# Patient Record
Sex: Male | Born: 1975 | Race: White | Hispanic: No | Marital: Single | Smoking: Current every day smoker
Health system: Southern US, Community
[De-identification: ages and names within clinical notes are randomized; demographics above are authoritative.]

## PROBLEM LIST (undated history)

## (undated) DIAGNOSIS — F329 Major depressive disorder, single episode, unspecified: Secondary | ICD-10-CM

## (undated) DIAGNOSIS — F1090 Alcohol use, unspecified, uncomplicated: Secondary | ICD-10-CM

## (undated) DIAGNOSIS — F319 Bipolar disorder, unspecified: Secondary | ICD-10-CM

## (undated) DIAGNOSIS — F32A Depression, unspecified: Secondary | ICD-10-CM

## (undated) DIAGNOSIS — F21 Schizotypal disorder: Secondary | ICD-10-CM

## (undated) DIAGNOSIS — F209 Schizophrenia, unspecified: Secondary | ICD-10-CM

## (undated) HISTORY — DX: Major depressive disorder, single episode, unspecified: F32.9

## (undated) HISTORY — DX: Depression, unspecified: F32.A

---

## 2004-11-18 ENCOUNTER — Emergency Department: Payer: Self-pay | Admitting: Emergency Medicine

## 2008-10-13 ENCOUNTER — Emergency Department: Payer: Self-pay

## 2009-07-18 ENCOUNTER — Emergency Department: Payer: Self-pay | Admitting: Internal Medicine

## 2010-01-25 ENCOUNTER — Emergency Department: Payer: Self-pay | Admitting: Emergency Medicine

## 2010-08-23 ENCOUNTER — Emergency Department (HOSPITAL_COMMUNITY)
Admission: EM | Admit: 2010-08-23 | Discharge: 2010-08-23 | Payer: Self-pay | Source: Home / Self Care | Admitting: Emergency Medicine

## 2010-08-23 ENCOUNTER — Encounter: Payer: Self-pay | Admitting: Emergency Medicine

## 2010-12-06 LAB — DIFFERENTIAL
Basophils Absolute: 0 10*3/uL (ref 0.0–0.1)
Eosinophils Relative: 1 % (ref 0–5)
Lymphocytes Relative: 13 % (ref 12–46)
Lymphs Abs: 1.5 10*3/uL (ref 0.7–4.0)
Monocytes Absolute: 1.1 10*3/uL — ABNORMAL HIGH (ref 0.1–1.0)
Neutro Abs: 8.7 10*3/uL — ABNORMAL HIGH (ref 1.7–7.7)

## 2010-12-06 LAB — CBC
HCT: 38.9 % — ABNORMAL LOW (ref 39.0–52.0)
MCV: 89.8 fL (ref 78.0–100.0)
Platelets: 236 10*3/uL (ref 150–400)
RBC: 4.33 MIL/uL (ref 4.22–5.81)
RDW: 11.7 % (ref 11.5–15.5)
WBC: 11.3 10*3/uL — ABNORMAL HIGH (ref 4.0–10.5)

## 2010-12-06 LAB — POCT CARDIAC MARKERS
CKMB, poc: <1 ng/mL — ABNORMAL LOW (ref 1.0–8.0)
Troponin i, poc: <0.05 ng/mL (ref 0.00–0.09)

## 2010-12-06 LAB — BASIC METABOLIC PANEL
Chloride: 103 mEq/L (ref 96–112)
GFR calc Af Amer: >60 mL/min (ref >60)
GFR calc non Af Amer: >60 mL/min (ref >60)
Potassium: 3.8 mEq/L (ref 3.5–5.1)

## 2010-12-06 LAB — RAPID STREP SCREEN (MED CTR MEBANE ONLY): Streptococcus, Group A Screen (Direct): POSITIVE — AB

## 2010-12-06 LAB — CULTURE, ROUTINE-ABSCESS

## 2011-06-25 ENCOUNTER — Emergency Department: Payer: Self-pay | Admitting: Emergency Medicine

## 2013-03-31 ENCOUNTER — Emergency Department: Payer: Self-pay | Admitting: Emergency Medicine

## 2013-10-24 ENCOUNTER — Emergency Department: Payer: Self-pay | Admitting: Emergency Medicine

## 2015-03-30 ENCOUNTER — Ambulatory Visit: Payer: Self-pay | Admitting: Ophthalmology

## 2017-03-13 ENCOUNTER — Emergency Department
Admission: EM | Admit: 2017-03-13 | Discharge: 2017-03-14 | Payer: Self-pay | Attending: Emergency Medicine | Admitting: Emergency Medicine

## 2017-03-13 ENCOUNTER — Encounter: Payer: Self-pay | Admitting: Emergency Medicine

## 2017-03-13 DIAGNOSIS — S91011A Laceration without foreign body, right ankle, initial encounter: Secondary | ICD-10-CM | POA: Insufficient documentation

## 2017-03-13 DIAGNOSIS — F17210 Nicotine dependence, cigarettes, uncomplicated: Secondary | ICD-10-CM | POA: Insufficient documentation

## 2017-03-13 DIAGNOSIS — F10920 Alcohol use, unspecified with intoxication, uncomplicated: Secondary | ICD-10-CM | POA: Insufficient documentation

## 2017-03-13 DIAGNOSIS — Y929 Unspecified place or not applicable: Secondary | ICD-10-CM | POA: Insufficient documentation

## 2017-03-13 DIAGNOSIS — W01198A Fall on same level from slipping, tripping and stumbling with subsequent striking against other object, initial encounter: Secondary | ICD-10-CM | POA: Insufficient documentation

## 2017-03-13 DIAGNOSIS — Y999 Unspecified external cause status: Secondary | ICD-10-CM | POA: Insufficient documentation

## 2017-03-13 DIAGNOSIS — Y939 Activity, unspecified: Secondary | ICD-10-CM | POA: Insufficient documentation

## 2017-03-13 MED ORDER — LIDOCAINE HCL (PF) 1 % IJ SOLN: 5.0000 mL | Freq: Once | INTRAMUSCULAR | Status: DC

## 2017-03-13 MED ORDER — AMOXICILLIN-POT CLAVULANATE 875-125 MG PO TABS: 1.0000 | ORAL_TABLET | Freq: Two times a day (BID) | ORAL | 0 refills | Status: AC

## 2017-03-13 MED ORDER — LIDOCAINE HCL (PF) 1 % IJ SOLN
INTRAMUSCULAR | Status: AC
  Filled 2017-03-13: qty 10
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
## 2017-03-13 NOTE — ED Notes (Signed)
Wound to rt. Ankle cleaned and redressed.

## 2017-03-13 NOTE — ED Triage Notes (Signed)
Pt. Presents to ED under IVC and in restraints with Sheriffs [employer].  Pt. Presents wearing only t-shirt and blanket worn around waist.  Pt. States he has been drinking today.  Pt. Has a laceration to rt. Ankle.  Bleeding controlled at this time.

## 2017-03-13 NOTE — ED Notes (Signed)
ED Provider at bedside. 

## 2017-03-13 NOTE — ED Notes (Signed)
Pt. Here under IVC, Pt. Presents to ED under the custody of sheriffs dept.  Pt. Unsure how he got laceration to rt. Ankle.  Pt. Has a 2 inch laceration to rt. Ankle.  Swelling noted to ankle.  Bleeding controlled at this time.  Pt. States "I have drinking alcohol today".

## 2017-03-14 NOTE — ED Notes (Signed)
Pt. Discharged into sheriffs custody.

## 2017-03-14 NOTE — ED Notes (Signed)
Redressed rt. Ankle with dressing and bandage.

## 2017-10-24 ENCOUNTER — Ambulatory Visit: Payer: Self-pay | Admitting: Ophthalmology

## 2017-10-31 ENCOUNTER — Ambulatory Visit: Payer: Self-pay | Admitting: Licensed Clinical Social Worker

## 2017-10-31 ENCOUNTER — Ambulatory Visit: Payer: Self-pay | Admitting: Ophthalmology

## 2017-10-31 ENCOUNTER — Encounter: Payer: Self-pay | Admitting: Licensed Clinical Social Worker

## 2017-10-31 DIAGNOSIS — F1020 Alcohol dependence, uncomplicated: Secondary | ICD-10-CM

## 2017-10-31 DIAGNOSIS — F333 Major depressive disorder, recurrent, severe with psychotic symptoms: Secondary | ICD-10-CM

## 2017-10-31 NOTE — Progress Notes (Signed)
Total time:1 hour Type of Service: Integrated Behavioral Health in clinic Interpretor:No.   SUBJECTIVE: Richard Mclean. is a 42 y.o. male  referred by Teah nurse practitioner at [facility] for symptoms of:  depression. Patient is accompanied by himself.  Patient reports the following symptoms and or concerns: bad mood, depression, excessive alcohol consumption, fatigue, irritability, sleep disturbance and tobacco use: stressors: financial concern, illness or family illness and employment concern,Health problems Other: homeless. Duration of problem:  Richard Mclean reports that he has been dealing with symptoms of depression on and off for several years. He notes that his symptoms include sadness, irritability, isolating himself, increase appetite, fatigue, difficulty sleeping, and difficulty concentrating. He denies suicidal and homicidal thoughts.  Impact on function: He notes that he became homeless a year ago when he had an accident at his job requiring surgery on his arm and hand. He notes that he is unable to completely close his hand and has been unable to find a job.  Current or Hx of substance use: Previously a member of [employer]. He admits to drinking on occasion, previously used to drink heavily; malt liquor and liquor like every weekend or 3 or 4 40 oz ice houses a few times a week, and last use was a week ago.  PSYCHIATRIC HISTORY - Medical conditions that might explain or contribute to symptoms: Surgeries to arm and hand due to an accident at his previous part time job a year in half ago. He is unable to fully open and close his hand.  Broke his right foot and heal bone several years ago. He notes that these are barriers to finding employment. Artery replacement surgery. - Hospitalizations/ Outpatient therapy:  He reports that he was hospitalized a few times for depression and last one was ten to fifteen years ago. He repots that he was previously hospitalized at  [facility], [facility], and [facility].  -Pharmacotherapy:He reports that he is was previously on mental health medications but is unable to remember the name or dosages. -Family history of psychiatric issues: He reports that his father attempted suicide by shooting himself but lived in patient's words. He reports that his mom has a history of depression, is on medication, and is an alcohol. He reports that his brother is gay and is aids.   OBJECTIVE:  Appearance:Casual; Mood: Euthymic ; Thought process: Coherent; Affect: Appropriate  Risk of harm to self or others: No plan to harm self or others   LIFE CONTEXT:  Family & Social:,patient lives with [employer] Work: Patient received his  GED while incarcerated in prison.  Life changes: Barriers are homelessness, transportation, and medical problems.  GOALS ADDRESSED:  Patient will reduce symptoms of: depression and stress; increase ability ZO:[id number] skills, will also :Increase healthy adjustment to current life circumstances.  INTERVENTIONS:Screening Tool(s)  Administered, Link to [employer], Copywriter, advertising , Reflective listening Standardized Assessments completed: PHQ 9=20,indication of: severe depression. Patient declined screening=0 indication of: na anxiety.   ISSUES DISCUSSED: Integrated care services, support system, previous and current coping skills, community resources , community support, things patient enjoy or use to enjoy doing, excessive drinking and .     ASSESSMENT:  Patient currently experiencing symptoms of  depression.  Symptoms exacerbated by health problems.  Patient may benefit from, and is in agreement to receive further assessment and brief therapeutic interventions to assist with managing symptoms.   PLAN: . Patient will F/U with LCSW n/a . LCSW  will F/U with phone call n/a . Behavioral recommendations: Discussion with patient about following up for one on one  therapy and case management services with Carey BullocksHeather Barak Bialecki LCSW in clinic and patient declined. . Referral:Psychiatrist, and Vocational Rehabilitation . From scale of 1-10, how likely are you to follow plan: 7  Warm Hand Off Completed.

## 2017-12-01 ENCOUNTER — Emergency Department
Admission: EM | Admit: 2017-12-01 | Discharge: 2017-12-02 | Disposition: A | Discharge status: Home / Self Care | Payer: Self-pay | Attending: Emergency Medicine | Admitting: Emergency Medicine

## 2017-12-01 DIAGNOSIS — F17210 Nicotine dependence, cigarettes, uncomplicated: Secondary | ICD-10-CM | POA: Insufficient documentation

## 2017-12-01 DIAGNOSIS — R0781 Pleurodynia: Secondary | ICD-10-CM | POA: Insufficient documentation

## 2017-12-01 DIAGNOSIS — F10920 Alcohol use, unspecified with intoxication, uncomplicated: Secondary | ICD-10-CM | POA: Insufficient documentation

## 2017-12-02 ENCOUNTER — Emergency Department: Payer: Self-pay

## 2017-12-02 LAB — COMPREHENSIVE METABOLIC PANEL
ALT: 29 U/L (ref 17–63)
AST: 28 U/L (ref 15–41)
Albumin: 4.8 g/dL (ref 3.5–5.0)
Alkaline Phosphatase: 61 U/L (ref 38–126)
Anion gap: 13 (ref 5–15)
BILIRUBIN TOTAL: 0.6 mg/dL (ref 0.3–1.2)
BUN: 13 mg/dL (ref 6–20)
CO2: 21 mmol/L — ABNORMAL LOW (ref 22–32)
CREATININE: 0.88 mg/dL (ref 0.61–1.24)
Calcium: 9.1 mg/dL (ref 8.9–10.3)
Chloride: 108 mmol/L (ref 101–111)
GFR calc Af Amer: >60 mL/min (ref >60)
Glucose, Bld: 106 mg/dL — ABNORMAL HIGH (ref 65–99)
Potassium: 3.9 mmol/L (ref 3.5–5.1)
Sodium: 142 mmol/L (ref 135–145)
TOTAL PROTEIN: 8.4 g/dL — AB (ref 6.5–8.1)

## 2017-12-02 LAB — URINE DRUG SCREEN, QUALITATIVE (ARMC ONLY)
Amphetamines, Ur Screen: NOT DETECTED
BENZODIAZEPINE, UR SCRN: NOT DETECTED
Barbiturates, Ur Screen: NOT DETECTED
Cannabinoid 50 Ng, Ur ~~LOC~~: POSITIVE — AB
Cocaine Metabolite,Ur ~~LOC~~: NOT DETECTED
MDMA (Ecstasy)Ur Screen: NOT DETECTED
METHADONE SCREEN, URINE: NOT DETECTED
OPIATE, UR SCREEN: NOT DETECTED
Phencyclidine (PCP) Ur S: NOT DETECTED
TRICYCLIC, UR SCREEN: NOT DETECTED

## 2017-12-02 LAB — CBC
HCT: 44.5 % (ref 40.0–52.0)
Hemoglobin: 15.6 g/dL (ref 13.0–18.0)
MCH: 32.7 pg (ref 26.0–34.0)
MCHC: 35 g/dL (ref 32.0–36.0)
MCV: 93.4 fL (ref 80.0–100.0)
Platelets: 259 10*3/uL (ref 150–440)
RBC: 4.76 MIL/uL (ref 4.40–5.90)
RDW: 12.2 % (ref 11.5–14.5)
WBC: 9.3 10*3/uL (ref 3.8–10.6)

## 2017-12-02 LAB — ETHANOL: ALCOHOL ETHYL (B): 282 mg/dL — AB (ref <10)

## 2017-12-02 NOTE — ED Notes (Signed)
Pt discharged home after verbalizing understanding of discharge instructions; nad noted. 

## 2017-12-02 NOTE — ED Notes (Signed)
Pt awake. Assisted up to the bathroom by EDT x 2 and changed into dry scrubs. Urine specimen obtained.

## 2017-12-02 NOTE — ED Notes (Signed)
Patient transported to X-ray 
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
## 2017-12-02 NOTE — ED Notes (Signed)
Pt returned from xray
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
## 2018-01-29 ENCOUNTER — Emergency Department (HOSPITAL_COMMUNITY)
Admission: EM | Admit: 2018-01-29 | Discharge: 2018-01-30 | Disposition: A | Discharge status: Home / Self Care | Payer: Self-pay | Attending: Emergency Medicine | Admitting: Emergency Medicine

## 2018-01-29 ENCOUNTER — Emergency Department (HOSPITAL_COMMUNITY): Payer: Self-pay

## 2018-01-29 ENCOUNTER — Encounter (HOSPITAL_COMMUNITY): Payer: Self-pay | Admitting: Emergency Medicine

## 2018-01-29 DIAGNOSIS — F10920 Alcohol use, unspecified with intoxication, uncomplicated: Secondary | ICD-10-CM

## 2018-01-29 DIAGNOSIS — S025XXA Fracture of tooth (traumatic), initial encounter for closed fracture: Secondary | ICD-10-CM | POA: Insufficient documentation

## 2018-01-29 DIAGNOSIS — Y929 Unspecified place or not applicable: Secondary | ICD-10-CM | POA: Insufficient documentation

## 2018-01-29 DIAGNOSIS — S0083XA Contusion of other part of head, initial encounter: Secondary | ICD-10-CM | POA: Insufficient documentation

## 2018-01-29 DIAGNOSIS — F10129 Alcohol abuse with intoxication, unspecified: Secondary | ICD-10-CM | POA: Insufficient documentation

## 2018-01-29 DIAGNOSIS — Y939 Activity, unspecified: Secondary | ICD-10-CM | POA: Insufficient documentation

## 2018-01-29 DIAGNOSIS — W010XXA Fall on same level from slipping, tripping and stumbling without subsequent striking against object, initial encounter: Secondary | ICD-10-CM | POA: Insufficient documentation

## 2018-01-29 DIAGNOSIS — S0993XA Unspecified injury of face, initial encounter: Secondary | ICD-10-CM

## 2018-01-29 DIAGNOSIS — F17210 Nicotine dependence, cigarettes, uncomplicated: Secondary | ICD-10-CM | POA: Insufficient documentation

## 2018-01-29 DIAGNOSIS — Y999 Unspecified external cause status: Secondary | ICD-10-CM | POA: Insufficient documentation
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
## 2018-01-29 NOTE — ED Notes (Signed)
Patient sitting in wheel chair in triage reports "I am good at acting up." Patient begins flailing arms and legs, attempting to flip wheelchair backwards. States "I sometimes have grand mal seizures."  A&Ox4.
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
## 2018-01-30 MED ORDER — ACETAMINOPHEN 325 MG PO TABS
650.0000 mg | ORAL_TABLET | Freq: Once | ORAL | Status: AC
  Administered 2018-01-30: 650 mg via ORAL
  Filled 2018-01-30: qty 2

## 2018-01-30 NOTE — ED Provider Notes (Signed)
[facility]-EMERGENCY DEPT Provider Note   CSN: [id number] Arrival date & time: 01/29/18  2019     History   Chief Complaint Chief Complaint  Patient presents with  . Fall  . Alcohol Intoxication    HPI Yukio Bisping Brett Darko. is a 42 y.o. male.  HPI Patient is a 42 year old male who admits to drinking alcohol this evening and reports falling forward and striking his face on the ground.  Brought to the emergency department via EMS.  C-collar in place.  Intoxicated.  Denies weakness of the arms and legs.  No paresthesias in his arms.  Denies chest and abdominal pain.  Denies weakness of his arms or legs.  No back pain.  Mild headache at this time.  No recent illness.  Presents with swelling of his lower lip and traumatic loss of tooth #24 and 25.   Past Medical History:  Diagnosis Date  . Depression     There are no active problems to display for this patient.   History reviewed. No pertinent surgical history.      Home Medications    Prior to Admission medications   Not on File    Family History Family History  Problem Relation Age of Onset  . Alcohol abuse Mother   . Depression Mother   . Mental illness Mother   . Mental illness Father   . Mental illness Sister   . HIV/AIDS Brother   . Mental illness Maternal Grandmother     Social History Social History   Tobacco Use  . Smoking status: Current Every Day Smoker    Types: Cigarettes  . Smokeless tobacco: Former Neurosurgeon    Types: Snuff  Substance Use Topics  . Alcohol use: Yes    Alcohol/week: 2.4 oz    Types: 4 Cans of beer per week    Comment: Malt liquor or liquor every other weekend  . Drug use: Not on file     Allergies   Patient has no known allergies.   Review of Systems Review of Systems  All other systems reviewed and are negative.    Physical Exam Updated Vital Signs BP (!) 133/94 (BP Location: Right Arm)   Pulse (!) 116   Temp 99.5 F (37.5 C) (Oral)   Resp  20   SpO2 96%   Physical Exam  Constitutional: He is oriented to person, place, and time. He appears well-developed and well-nourished.  HENT:  Head: Normocephalic.  Swelling of the lower lip.  Traumatic loss of tooth #24 and 25 without bleeding from the gumline.  No trismus or malocclusion.  Tolerating secretions.  Frontal and maxillary sinuses without tenderness  Eyes: EOM are normal.  Neck: Normal range of motion.  Pulmonary/Chest: Effort normal.  Abdominal: He exhibits no distension.  Musculoskeletal: Normal range of motion.  Neurological: He is alert and oriented to person, place, and time.  Psychiatric: He has a normal mood and affect.  Nursing note and vitals reviewed.    ED Treatments / Results  Labs (all labs ordered are listed, but only abnormal results are displayed) Labs Reviewed - No data to display  EKG None  Radiology Ct Head Wo Contrast  Result Date: 01/30/2018 CLINICAL DATA:  42 year old male with facial trauma. EXAM: CT HEAD WITHOUT CONTRAST CT MAXILLOFACIAL WITHOUT CONTRAST CT CERVICAL SPINE WITHOUT CONTRAST TECHNIQUE: Multidetector CT imaging of the head, cervical spine, and maxillofacial structures were performed using the standard protocol without intravenous contrast. Multiplanar CT image reconstructions of the  cervical spine and maxillofacial structures were also generated. COMPARISON:  Head CT dated 01/25/2010 FINDINGS: CT HEAD FINDINGS Brain: No evidence of acute infarction, hemorrhage, hydrocephalus, extra-axial collection or mass lesion/mass effect. Vascular: No hyperdense vessel or unexpected calcification. Skull: Normal. Negative for fracture or focal lesion. Other: None CT MAXILLOFACIAL FINDINGS Osseous: No fracture or mandibular dislocation. No destructive process. There are multiple dental caries with periapical lucency involving the maxillary central and lateral incisor teeth. There is absence of left mandibular incisor teeth. Clinical correlation is  recommended. Orbits: Negative. No traumatic or inflammatory finding. Sinuses: Clear. Soft tissues: Negative. CT CERVICAL SPINE FINDINGS Alignment: No acute subluxation. Skull base and vertebrae: No acute fracture. No primary bone lesion or focal pathologic process. Soft tissues and spinal canal: No prevertebral fluid or swelling. No visible canal hematoma. Disc levels: No acute findings. Mild degenerative changes at C6-C7. Upper chest: Negative. Other: None IMPRESSION: 1. Normal noncontrast CT of the brain. 2. No acute/traumatic cervical spine pathology. 3. No acute facial bone fractures. 4. Multiple dental caries and periapical lucency involving the maxillary incisor teeth. Correlation with dental exam recommended. Electronically Signed   By: Elgie Collard M.D.   On: 01/30/2018 01:30   Ct Cervical Spine Wo Contrast  Result Date: 01/30/2018 CLINICAL DATA:  42 year old male with facial trauma. EXAM: CT HEAD WITHOUT CONTRAST CT MAXILLOFACIAL WITHOUT CONTRAST CT CERVICAL SPINE WITHOUT CONTRAST TECHNIQUE: Multidetector CT imaging of the head, cervical spine, and maxillofacial structures were performed using the standard protocol without intravenous contrast. Multiplanar CT image reconstructions of the cervical spine and maxillofacial structures were also generated. COMPARISON:  Head CT dated 01/25/2010 FINDINGS: CT HEAD FINDINGS Brain: No evidence of acute infarction, hemorrhage, hydrocephalus, extra-axial collection or mass lesion/mass effect. Vascular: No hyperdense vessel or unexpected calcification. Skull: Normal. Negative for fracture or focal lesion. Other: None CT MAXILLOFACIAL FINDINGS Osseous: No fracture or mandibular dislocation. No destructive process. There are multiple dental caries with periapical lucency involving the maxillary central and lateral incisor teeth. There is absence of left mandibular incisor teeth. Clinical correlation is recommended. Orbits: Negative. No traumatic or inflammatory  finding. Sinuses: Clear. Soft tissues: Negative. CT CERVICAL SPINE FINDINGS Alignment: No acute subluxation. Skull base and vertebrae: No acute fracture. No primary bone lesion or focal pathologic process. Soft tissues and spinal canal: No prevertebral fluid or swelling. No visible canal hematoma. Disc levels: No acute findings. Mild degenerative changes at C6-C7. Upper chest: Negative. Other: None IMPRESSION: 1. Normal noncontrast CT of the brain. 2. No acute/traumatic cervical spine pathology. 3. No acute facial bone fractures. 4. Multiple dental caries and periapical lucency involving the maxillary incisor teeth. Correlation with dental exam recommended. Electronically Signed   By: Elgie Collard M.D.   On: 01/30/2018 01:30   Ct Maxillofacial Wo Contrast  Result Date: 01/30/2018 CLINICAL DATA:  42 year old male with facial trauma. EXAM: CT HEAD WITHOUT CONTRAST CT MAXILLOFACIAL WITHOUT CONTRAST CT CERVICAL SPINE WITHOUT CONTRAST TECHNIQUE: Multidetector CT imaging of the head, cervical spine, and maxillofacial structures were performed using the standard protocol without intravenous contrast. Multiplanar CT image reconstructions of the cervical spine and maxillofacial structures were also generated. COMPARISON:  Head CT dated 01/25/2010 FINDINGS: CT HEAD FINDINGS Brain: No evidence of acute infarction, hemorrhage, hydrocephalus, extra-axial collection or mass lesion/mass effect. Vascular: No hyperdense vessel or unexpected calcification. Skull: Normal. Negative for fracture or focal lesion. Other: None CT MAXILLOFACIAL FINDINGS Osseous: No fracture or mandibular dislocation. No destructive process. There are multiple dental caries with periapical lucency involving the  maxillary central and lateral incisor teeth. There is absence of left mandibular incisor teeth. Clinical correlation is recommended. Orbits: Negative. No traumatic or inflammatory finding. Sinuses: Clear. Soft tissues: Negative. CT CERVICAL  SPINE FINDINGS Alignment: No acute subluxation. Skull base and vertebrae: No acute fracture. No primary bone lesion or focal pathologic process. Soft tissues and spinal canal: No prevertebral fluid or swelling. No visible canal hematoma. Disc levels: No acute findings. Mild degenerative changes at C6-C7. Upper chest: Negative. Other: None IMPRESSION: 1. Normal noncontrast CT of the brain. 2. No acute/traumatic cervical spine pathology. 3. No acute facial bone fractures. 4. Multiple dental caries and periapical lucency involving the maxillary incisor teeth. Correlation with dental exam recommended. Electronically Signed   By: Elgie Collard M.D.   On: 01/30/2018 01:30    Procedures Procedures (including critical care time)  Medications Ordered in ED Medications - No data to display   Initial Impression / Assessment and Plan / ED Course  I have reviewed the triage vital signs and the nursing notes.  Pertinent labs & imaging results that were available during my care of the patient were reviewed by me and considered in my medical decision making (see chart for details).     Alcohol intoxication.  CT images demonstrate no acute traumatic radiographic pathology.  He does have traumatic loss of tooth #24 and 25 for which he will need oral surgery follow-up.  Recommended soft diet as tolerated.  Riemer care follow-up.  Patient understands return to the emergency department for new or worsening symptoms  Final Clinical Impressions(s) / ED Diagnoses   Final diagnoses:  Contusion of face, initial encounter  Dental injury, initial encounter  Acute alcoholic intoxication without complication [facility])    ED Discharge Orders    None       Azalia Bilis, MD 01/30/18 Earle Gell

## 2018-01-30 NOTE — ED Notes (Signed)
Pt is in CT

## 2018-07-16 ENCOUNTER — Emergency Department (HOSPITAL_COMMUNITY)
Admission: EM | Admit: 2018-07-16 | Discharge: 2018-07-17 | Disposition: A | Discharge status: Home / Self Care | Payer: Self-pay | Attending: Emergency Medicine | Admitting: Emergency Medicine

## 2018-07-16 ENCOUNTER — Encounter (HOSPITAL_COMMUNITY): Payer: Self-pay | Admitting: Emergency Medicine

## 2018-07-16 DIAGNOSIS — F17210 Nicotine dependence, cigarettes, uncomplicated: Secondary | ICD-10-CM | POA: Insufficient documentation

## 2018-07-16 DIAGNOSIS — F10929 Alcohol use, unspecified with intoxication, unspecified: Secondary | ICD-10-CM | POA: Insufficient documentation

## 2018-07-16 DIAGNOSIS — F10920 Alcohol use, unspecified with intoxication, uncomplicated: Secondary | ICD-10-CM

## 2018-07-16 LAB — CBC WITH DIFFERENTIAL/PLATELET
ABS IMMATURE GRANULOCYTES: 0.02 10*3/uL (ref 0.00–0.07)
BASOS ABS: 0.1 10*3/uL (ref 0.0–0.1)
BASOS PCT: 1 %
EOS ABS: 0.2 10*3/uL (ref 0.0–0.5)
Eosinophils Relative: 2 %
HEMATOCRIT: 45.6 % (ref 39.0–52.0)
Hemoglobin: 15.5 g/dL (ref 13.0–17.0)
Immature Granulocytes: 0 %
LYMPHS ABS: 3 10*3/uL (ref 0.7–4.0)
Lymphocytes Relative: 35 %
MCH: 32.8 pg (ref 26.0–34.0)
MCHC: 34 g/dL (ref 30.0–36.0)
MCV: 96.6 fL (ref 80.0–100.0)
MONOS PCT: 7 %
Monocytes Absolute: 0.6 10*3/uL (ref 0.1–1.0)
NEUTROS ABS: 4.8 10*3/uL (ref 1.7–7.7)
NEUTROS PCT: 55 %
NRBC: 0 % (ref 0.0–0.2)
PLATELETS: 254 10*3/uL (ref 150–400)
RBC: 4.72 MIL/uL (ref 4.22–5.81)
RDW: 11.8 % (ref 11.5–15.5)
WBC: 8.6 10*3/uL (ref 4.0–10.5)

## 2018-07-16 NOTE — ED Triage Notes (Signed)
Patient arrived with EMS from street intoxicated with ETOH , ambulatory , denies suicidal ideation or hallucinations .

## 2018-07-17 LAB — RAPID URINE DRUG SCREEN, HOSP PERFORMED
Amphetamines: NOT DETECTED
BARBITURATES: NOT DETECTED
BENZODIAZEPINES: NOT DETECTED
Cocaine: NOT DETECTED
Opiates: NOT DETECTED
TETRAHYDROCANNABINOL: NOT DETECTED

## 2018-07-17 LAB — COMPREHENSIVE METABOLIC PANEL
ALBUMIN: 3.7 g/dL (ref 3.5–5.0)
ALK PHOS: 48 U/L (ref 38–126)
ALT: 38 U/L (ref 0–44)
AST: 36 U/L (ref 15–41)
Anion gap: 10 (ref 5–15)
BILIRUBIN TOTAL: 0.5 mg/dL (ref 0.3–1.2)
BUN: 8 mg/dL (ref 6–20)
CO2: 18 mmol/L — AB (ref 22–32)
Calcium: 8.5 mg/dL — ABNORMAL LOW (ref 8.9–10.3)
Chloride: 114 mmol/L — ABNORMAL HIGH (ref 98–111)
Creatinine, Ser: 1.01 mg/dL (ref 0.61–1.24)
GFR calc Af Amer: >60 mL/min (ref >60)
GFR calc non Af Amer: >60 mL/min (ref >60)
GLUCOSE: 101 mg/dL — AB (ref 70–99)
Potassium: 3.4 mmol/L — ABNORMAL LOW (ref 3.5–5.1)
SODIUM: 142 mmol/L (ref 135–145)
TOTAL PROTEIN: 6 g/dL — AB (ref 6.5–8.1)

## 2018-07-17 LAB — ETHANOL: Alcohol, Ethyl (B): 301 mg/dL (ref <10)

## 2018-07-17 NOTE — ED Provider Notes (Signed)
[facility] EMERGENCY DEPARTMENT Provider Note   CSN: [id number] Arrival date & time: 07/16/18  2307     History   Chief Complaint Chief Complaint  Patient presents with  . Alcohol Intoxication    HPI Richard Reifsteck Kiptyn Rafuse. is a 42 y.o. male.  Patient presents the emergency department with a chief complaint of alcohol intoxication.  He was brought to the emergency department by EMS, after being found intoxicated on the street.  Patient denies any complaints at this time.  He states that he has been drinking today, and that he was drinking a little more heavily than normal.  He states that he just needs something to eat and somewhere to sleep.  The history is provided by the patient. No language interpreter was used.    Past Medical History:  Diagnosis Date  . Depression     There are no active problems to display for this patient.   History reviewed. No pertinent surgical history.      Home Medications    Prior to Admission medications   Not on File    Family History Family History  Problem Relation Age of Onset  . Alcohol abuse Mother   . Depression Mother   . Mental illness Mother   . Mental illness Father   . Mental illness Sister   . HIV/AIDS Brother   . Mental illness Maternal Grandmother     Social History Social History   Tobacco Use  . Smoking status: Current Every Day Smoker    Types: Cigarettes  . Smokeless tobacco: Former Neurosurgeon    Types: Snuff  Substance Use Topics  . Alcohol use: Yes    Alcohol/week: 4.0 standard drinks    Types: 4 Cans of beer per week    Comment: Malt liquor or liquor every other weekend  . Drug use: Never     Allergies   Patient has no known allergies.   Review of Systems Review of Systems  All other systems reviewed and are negative.    Physical Exam Updated Vital Signs BP 126/88 (BP Location: Right Arm)   Pulse 82   Temp 98.4 F (36.9 C) (Oral)   Resp 16   SpO2 100%   Physical  Exam  Constitutional: He is oriented to person, place, and time. No distress.  Eating and drinking  HENT:  Head: Normocephalic and atraumatic.  Eyes: Pupils are equal, round, and reactive to light. Conjunctivae and EOM are normal.  Neck: No tracheal deviation present.  Cardiovascular: Normal rate.  Pulmonary/Chest: Effort normal. No respiratory distress.  Abdominal: Soft.  Musculoskeletal: Normal range of motion.  Neurological: He is alert and oriented to person, place, and time.  Skin: Skin is warm and dry. He is not diaphoretic.  Psychiatric: Judgment normal.  Nursing note and vitals reviewed.    ED Treatments / Results  Labs (all labs ordered are listed, but only abnormal results are displayed) Labs Reviewed  COMPREHENSIVE METABOLIC PANEL - Abnormal; Notable for the following components:      Result Value   Potassium 3.4 (*)    Chloride 114 (*)    CO2 18 (*)    Glucose, Bld 101 (*)    Calcium 8.5 (*)    Total Protein 6.0 (*)    All other components within normal limits  ETHANOL - Abnormal; Notable for the following components:   Alcohol, Ethyl (B) 301 (*)    All other components within normal limits  CBC WITH DIFFERENTIAL/PLATELET  RAPID URINE DRUG SCREEN, HOSP PERFORMED    EKG None  Radiology No results found.  Procedures Procedures (including critical care time)  Medications Ordered in ED Medications - No data to display   Initial Impression / Assessment and Plan / ED Course  I have reviewed the triage vital signs and the nursing notes.  Pertinent labs & imaging results that were available during my care of the patient were reviewed by me and considered in my medical decision making (see chart for details).     Patient with alcohol intoxication.  He is brought in off the street by EMS due to his intoxicated state.  He denies any complaints.  He is eating and drinking.  I do not believe him to be a danger to himself.  He is able to ambulate with steady  gait.  Discharged to home.  Final Clinical Impressions(s) / ED Diagnoses   Final diagnoses:  Alcoholic intoxication without complication [facility])    ED Discharge Orders    None       Roxy Horseman, PA-C 07/17/18 0242    Ward, Layla Maw, DO 07/17/18 4098

## 2018-07-17 NOTE — ED Notes (Signed)
Pt ambulated off of unit after discharge orders placed without staying to review discharge materials and retrieve vitals

## 2018-07-17 NOTE — ED Notes (Signed)
Called pt with no answer.

## 2018-07-17 NOTE — ED Notes (Signed)
Pt consumed [place] sandwich and coke. Able to ambulate unassisted gait steady.

## 2018-09-30 ENCOUNTER — Other Ambulatory Visit: Payer: Self-pay

## 2018-09-30 ENCOUNTER — Emergency Department
Admission: EM | Admit: 2018-09-30 | Discharge: 2018-10-01 | Payer: Self-pay | Attending: Emergency Medicine | Admitting: Emergency Medicine

## 2018-09-30 ENCOUNTER — Encounter: Payer: Self-pay | Admitting: Emergency Medicine

## 2018-09-30 DIAGNOSIS — F10920 Alcohol use, unspecified with intoxication, uncomplicated: Secondary | ICD-10-CM | POA: Insufficient documentation

## 2018-09-30 DIAGNOSIS — R531 Weakness: Secondary | ICD-10-CM | POA: Insufficient documentation

## 2018-09-30 DIAGNOSIS — R200 Anesthesia of skin: Secondary | ICD-10-CM | POA: Insufficient documentation

## 2018-09-30 DIAGNOSIS — F1210 Cannabis abuse, uncomplicated: Secondary | ICD-10-CM | POA: Insufficient documentation

## 2018-09-30 DIAGNOSIS — Y908 Blood alcohol level of 240 mg/100 ml or more: Secondary | ICD-10-CM | POA: Insufficient documentation

## 2018-09-30 DIAGNOSIS — F17210 Nicotine dependence, cigarettes, uncomplicated: Secondary | ICD-10-CM | POA: Insufficient documentation

## 2018-09-30 LAB — COMPREHENSIVE METABOLIC PANEL
ALT: 50 U/L — ABNORMAL HIGH (ref 0–44)
ANION GAP: 11 (ref 5–15)
AST: 73 U/L — ABNORMAL HIGH (ref 15–41)
Albumin: 4.8 g/dL (ref 3.5–5.0)
Alkaline Phosphatase: 75 U/L (ref 38–126)
BILIRUBIN TOTAL: 0.7 mg/dL (ref 0.3–1.2)
BUN: 9 mg/dL (ref 6–20)
CALCIUM: 9.3 mg/dL (ref 8.9–10.3)
CO2: 26 mmol/L (ref 22–32)
Chloride: 102 mmol/L (ref 98–111)
Creatinine, Ser: 1.04 mg/dL (ref 0.61–1.24)
GFR calc Af Amer: >60 mL/min (ref >60)
Glucose, Bld: 107 mg/dL — ABNORMAL HIGH (ref 70–99)
POTASSIUM: 4.2 mmol/L (ref 3.5–5.1)
Sodium: 139 mmol/L (ref 135–145)
TOTAL PROTEIN: 8.6 g/dL — AB (ref 6.5–8.1)

## 2018-09-30 LAB — CBC
HCT: 46.3 % (ref 39.0–52.0)
Hemoglobin: 15.9 g/dL (ref 13.0–17.0)
MCH: 32.3 pg (ref 26.0–34.0)
MCHC: 34.3 g/dL (ref 30.0–36.0)
MCV: 94.1 fL (ref 80.0–100.0)
NRBC: 0 % (ref 0.0–0.2)
PLATELETS: 248 10*3/uL (ref 150–400)
RBC: 4.92 MIL/uL (ref 4.22–5.81)
RDW: 11.9 % (ref 11.5–15.5)
WBC: 8 10*3/uL (ref 4.0–10.5)

## 2018-09-30 LAB — ETHANOL
ALCOHOL ETHYL (B): 346 mg/dL — AB (ref <10)
ALCOHOL ETHYL (B): 221 mg/dL — AB (ref <10)

## 2018-09-30 NOTE — ED Provider Notes (Addendum)
[facility] Emergency Department Provider Note  \ ____________________________________________   First MD Initiated Contact with Patient 09/30/18 1816     (approximate)  I have reviewed the triage vital signs and the nursing notes.   HISTORY  Chief Complaint Alcohol Intoxication   HPI  Richard Mclean. is a 43 y.o. male patient comes in to be medically cleared before going to jail.  He is under arrest for driving under the influence with an open bottle.  Patient's alcohol level is 346.  He can barely sit up straight.  I think it would be safer to keep him here.  I would not want him being on the bed in the jail and rolling off and hitting his head on the floor.  Officer is okay with this.  Reports multiple years of left-sided numbness and weakness if he lays on his left side for too long he reports multiple years of having a black spot on his right forearm which has not changed.  Patient has no other complaints   Past Medical History:  Diagnosis Date  . Depression     There are no active problems to display for this patient.   History reviewed. No pertinent surgical history.  Prior to Admission medications   Not on File    Allergies Patient has no known allergies.  Family History  Problem Relation Age of Onset  . Alcohol abuse Mother   . Depression Mother   . Mental illness Mother   . Mental illness Father   . Mental illness Sister   . HIV/AIDS Brother   . Mental illness Maternal Grandmother     Social History Social History   Tobacco Use  . Smoking status: Current Every Day Smoker    Types: Cigarettes  . Smokeless tobacco: Former Neurosurgeon    Types: Snuff  Substance Use Topics  . Alcohol use: Yes    Alcohol/week: 4.0 standard drinks    Types: 4 Cans of beer per week    Comment: Malt liquor or liquor every other weekend  . Drug use: Yes    Types: Marijuana    Review of Systems  Unable to obtain adequately due to  intoxication ____________________________________________   PHYSICAL EXAM:  VITAL SIGNS: ED Triage Vitals  Enc Vitals Group     BP 09/30/18 1726 (!) 136/92     Pulse Rate 09/30/18 1726 (!) 107     Resp 09/30/18 1726 16     Temp 09/30/18 1726 98.4 F (36.9 C)     Temp Source 09/30/18 1726 Oral     SpO2 09/30/18 1726 99 %     Weight 09/30/18 1727 185 lb (83.9 kg)     Height 09/30/18 1727 5\' 9"  (1.753 m)     Head Circumference --      Peak Flow --      Pain Score 09/30/18 1727 0     Pain Loc --      Pain Edu? --      Excl. in GC? --     Constitutional: Alert but intoxicated has difficulty sitting up eyes: Conjunctivae are normal.  Head: Atraumatic. Nose: No congestion/rhinnorhea. Mouth/Throat: Mucous membranes are moist.  Oropharynx non-erythematous. Neck: No stridor.  Cardiovascular: Normal rate, regular rhythm. Grossly normal heart sounds.  Good peripheral circulation. Respiratory: Normal respiratory effort.  No retractions. Lungs CTAB. Gastrointestinal: Soft and nontender. No distention. No abdominal bruits. No CVA tenderness. Musculoskeletal: No lower extremity tenderness nor edema.  No joint effusions.  Neurologic:  Normal speech and language. No gross focal neurologic deficits are appreciated. Skin:  Skin is warm, dry and intact. No rash noted.   ____________________________________________   LABS (all labs ordered are listed, but only abnormal results are displayed)  Labs Reviewed  COMPREHENSIVE METABOLIC PANEL - Abnormal; Notable for the following components:      Result Value   Glucose, Bld 107 (*)    Total Protein 8.6 (*)    AST 73 (*)    ALT 50 (*)    All other components within normal limits  ETHANOL - Abnormal; Notable for the following components:   Alcohol, Ethyl (B) 346 (*)    All other components within normal limits  ETHANOL - Abnormal; Notable for the following components:   Alcohol, Ethyl (B) 221 (*)    All other components within normal limits   CBC  URINE DRUG SCREEN, QUALITATIVE ([facility] ONLY)   ____________________________________________  EKG   ____________________________________________  RADIOLOGY  ED MD interpretation:    Official radiology report(s): No results found.  ____________________________________________   PROCEDURES  Procedure(s) performed:   Procedures  Critical Care performed:   ____________________________________________   INITIAL IMPRESSION / ASSESSMENT AND PLAN / ED COURSE  We will let the patient sober up.  His LFTs are elevated is evidence of his alcohol use.  If the police do not want him tomorrow we will try to get him to go to [facility].     Clinical Course as of Jan 08 0000  Tue Sep 30, 2018  1816 RBC: 4.92 [PM]    Clinical Course User Index [PM] Arnaldo NatalMalinda, Paul F, MD   ----------------------------------------- 11:59 PM on 09/30/2018 -----------------------------------------  Patient's alcohol level is now 220.  He is able to walk normally.  I will let him go to jail.  He should be safe there now.  He will need to follow-up with a doctor and do AA etc.  ____________________________________________   FINAL CLINICAL IMPRESSION(S) / ED DIAGNOSES  Final diagnoses:  Alcoholic intoxication without complication [facility])     ED Discharge Orders    None       Note:  This document was prepared using Dragon voice recognition software and may include unintentional dictation errors.    Arnaldo NatalMalinda, Paul F, MD 09/30/18 [id number]    Arnaldo NatalMalinda, Paul F, MD 10/01/18 0000

## 2018-09-30 NOTE — ED Notes (Signed)
Pt given graham crackers, peanut butter and grape juice as a snack. Pt stating, "The homeless shelter has better snacks than this." Will continue to monitor.

## 2018-09-30 NOTE — ED Notes (Signed)
Hourly rounding reveals patient in room. No complaints, stable, in no acute distress. Q15 minute rounds and monitoring via Rover and Officer to continue.   

## 2018-09-30 NOTE — ED Triage Notes (Signed)
Brought by police for clearance for jail.  Patient says he thinks he has overdosed on alcholol and says he has drank too much beer.

## 2018-09-30 NOTE — ED Notes (Signed)
Report to include Situation, Background, Assessment, and Recommendations received from Jadeka RN. Patient alert and oriented, warm and dry, in no acute distress. Patient denies SI, HI, AVH and pain. Patient made aware of Q15 minute rounds and Rover and Officer presence for their safety. Patient instructed to come to me with needs or concerns.  

## 2018-10-01 LAB — URINE DRUG SCREEN, QUALITATIVE (ARMC ONLY)
AMPHETAMINES, UR SCREEN: NOT DETECTED
Barbiturates, Ur Screen: NOT DETECTED
Benzodiazepine, Ur Scrn: NOT DETECTED
COCAINE METABOLITE, UR ~~LOC~~: NOT DETECTED
Cannabinoid 50 Ng, Ur ~~LOC~~: NOT DETECTED
MDMA (ECSTASY) UR SCREEN: NOT DETECTED
METHADONE SCREEN, URINE: NOT DETECTED
OPIATE, UR SCREEN: NOT DETECTED
PHENCYCLIDINE (PCP) UR S: NOT DETECTED
Tricyclic, Ur Screen: NOT DETECTED

## 2018-10-01 MED ORDER — FOLIC ACID 1 MG PO TABS
1.0000 mg | ORAL_TABLET | Freq: Once | ORAL | Status: AC
  Administered 2018-10-01: 1 mg via ORAL
  Filled 2018-10-01: qty 1

## 2018-10-01 MED ORDER — VITAMIN B-1 100 MG PO TABS
50.0000 mg | ORAL_TABLET | Freq: Once | ORAL | Status: AC
  Administered 2018-10-01: 50 mg via ORAL
  Filled 2018-10-01: qty 1

## 2018-10-01 MED ORDER — THIAMINE HCL 100 MG/ML IJ SOLN: 100.0000 mg | Freq: Once | INTRAMUSCULAR | Status: DC

## 2018-10-01 NOTE — Discharge Instructions (Addendum)
Please follow-up with Alcoholics Anonymous.  They can help you stay off of alcohol or at least limit your alcohol use.  He will need to do this.  Your liver is already showing signs of inflammation.  No need to follow-up with primary care to keep an eye on your liver function tests to make sure they improve.  If you do not you can get cirrhosis and end up dying from alcohol use.

## 2018-10-01 NOTE — ED Notes (Signed)
Pt stable in NAD. He  transferred to [employer]. Pt belongings given upon discharge. Discharge instructions given and pt verbalized understanding. No issues

## 2018-10-01 NOTE — ED Notes (Signed)
Hourly rounding reveals patient in room. No complaints, stable, in no acute distress. Q15 minute rounds and monitoring via Rover and Officer to continue.   

## 2018-11-06 ENCOUNTER — Ambulatory Visit: Payer: Self-pay | Admitting: Ophthalmology

## 2018-12-24 ENCOUNTER — Other Ambulatory Visit: Payer: Self-pay

## 2018-12-24 ENCOUNTER — Emergency Department
Admission: EM | Admit: 2018-12-24 | Discharge: 2018-12-24 | Disposition: A | Discharge status: Home / Self Care | Payer: Self-pay | Attending: Emergency Medicine | Admitting: Emergency Medicine

## 2018-12-24 ENCOUNTER — Encounter: Payer: Self-pay | Admitting: Emergency Medicine

## 2018-12-24 DIAGNOSIS — F17210 Nicotine dependence, cigarettes, uncomplicated: Secondary | ICD-10-CM | POA: Insufficient documentation

## 2018-12-24 DIAGNOSIS — Y908 Blood alcohol level of 240 mg/100 ml or more: Secondary | ICD-10-CM | POA: Insufficient documentation

## 2018-12-24 DIAGNOSIS — F10929 Alcohol use, unspecified with intoxication, unspecified: Secondary | ICD-10-CM | POA: Insufficient documentation

## 2018-12-24 LAB — COMPREHENSIVE METABOLIC PANEL
ALT: 35 U/L (ref 0–44)
AST: 25 U/L (ref 15–41)
Albumin: 4.3 g/dL (ref 3.5–5.0)
Alkaline Phosphatase: 61 U/L (ref 38–126)
Anion gap: 11 (ref 5–15)
BUN: 14 mg/dL (ref 6–20)
CO2: 22 mmol/L (ref 22–32)
Calcium: 9.2 mg/dL (ref 8.9–10.3)
Chloride: 109 mmol/L (ref 98–111)
Creatinine, Ser: 0.97 mg/dL (ref 0.61–1.24)
GFR calc Af Amer: >60 mL/min (ref >60)
GFR calc non Af Amer: >60 mL/min (ref >60)
Glucose, Bld: 93 mg/dL (ref 70–99)
Potassium: 3.3 mmol/L — ABNORMAL LOW (ref 3.5–5.1)
Sodium: 142 mmol/L (ref 135–145)
Total Bilirubin: 0.4 mg/dL (ref 0.3–1.2)
Total Protein: 7.6 g/dL (ref 6.5–8.1)

## 2018-12-24 LAB — CBC
HCT: 45.2 % (ref 39.0–52.0)
Hemoglobin: 15.4 g/dL (ref 13.0–17.0)
MCH: 33.1 pg (ref 26.0–34.0)
MCHC: 34.1 g/dL (ref 30.0–36.0)
MCV: 97.2 fL (ref 80.0–100.0)
Platelets: 261 10*3/uL (ref 150–400)
RBC: 4.65 MIL/uL (ref 4.22–5.81)
RDW: 11.9 % (ref 11.5–15.5)
WBC: 8.3 10*3/uL (ref 4.0–10.5)
nRBC: 0 % (ref 0.0–0.2)

## 2018-12-24 LAB — ETHANOL: Alcohol, Ethyl (B): 312 mg/dL (ref <10)

## 2018-12-24 NOTE — ED Notes (Signed)
Pt resting in NAD at this time. Pt breathing even and unlabored.

## 2018-12-24 NOTE — ED Provider Notes (Signed)
[facility] Emergency Department Provider Note  ____________________________________________  Time seen: Approximately 1:35 PM  I have reviewed the triage vital signs and the nursing notes.   HISTORY  Chief Complaint Alcohol Intoxication  Level 5 caveat:  Portions of the history and physical were unable to be obtained due to intoxication   HPI Cutler Patriarca Avelino Lanoux. is a 43 y.o. male with a history of depression alcohol abuse who presents for alcohol intoxication.  Apparently police was called to a church work patient was intoxicated and belligerent.  He was brought to the emergency room for evaluation.  Patient is drunk and keeps screaming "I have the coronavirus". He has no cough, no difficulty breathing, and no fever on arrival to the ED.   Past Medical History:  Diagnosis Date  . Depression    Allergies Patient has no known allergies.  Family History  Problem Relation Age of Onset  . Alcohol abuse Mother   . Depression Mother   . Mental illness Mother   . Mental illness Father   . Mental illness Sister   . HIV/AIDS Brother   . Mental illness Maternal Grandmother     Social History Social History   Tobacco Use  . Smoking status: Current Every Day Smoker    Types: Cigarettes  . Smokeless tobacco: Former Neurosurgeon    Types: Snuff  Substance Use Topics  . Alcohol use: Yes    Alcohol/week: 4.0 standard drinks    Types: 4 Cans of beer per week    Comment: Malt liquor or liquor every other weekend  . Drug use: Yes    Types: Marijuana    Review of Systems  Constitutional: Negative for fever. Cardiovascular: Negative for chest pain. Respiratory: Negative for shortness of breath. Gastrointestinal: Negative for abdominal pain, vomiting or diarrhea. Psych: No SI or HI. + intoxicated  ____________________________________________   PHYSICAL EXAM:  VITAL SIGNS: ED Triage Vitals  Enc Vitals Group     BP 12/24/18 1322 129/85     Pulse Rate  12/24/18 1327 93     Resp 12/24/18 1327 18     Temp 12/24/18 1327 97.8 F (36.6 C)     Temp Source 12/24/18 1327 Axillary     SpO2 12/24/18 1327 97 %     Weight 12/24/18 1323 200 lb (90.7 kg)     Height 12/24/18 1323 5\' 10"  (1.778 m)     Head Circumference --      Peak Flow --      Pain Score 12/24/18 1322 5     Pain Loc --      Pain Edu? --      Excl. in GC? --     Constitutional: Alert and oriented, intoxicated, keeps saying he has coronavirus, will not answer any questions HEENT:      Head: Normocephalic and atraumatic.         Eyes: Conjunctivae are normal. Sclera is non-icteric.       Mouth/Throat: Mucous membranes are moist.       Neck: Supple with no signs of meningismus. Cardiovascular: Regular rate and rhythm. No murmurs, gallops, or rubs.  Respiratory: Normal respiratory effort. Lungs are clear to auscultation bilaterally. No wheezes, crackles, or rhonchi.  Gastrointestinal: Soft, non tender, and non distended with positive bowel sounds. No rebound or guarding. Musculoskeletal: Nontender with normal range of motion in all extremities. No edema, cyanosis, or erythema of extremities. Neurologic: Normal speech and language. Face is symmetric. Moving all extremities. No  gross focal neurologic deficits are appreciated. Skin: Skin is warm, dry and intact. No rash noted. Psychiatric: Mood and affect are agitated _________________________________________   LABS (all labs ordered are listed, but only abnormal results are displayed)  Labs Reviewed  COMPREHENSIVE METABOLIC PANEL - Abnormal; Notable for the following components:      Result Value   Potassium 3.3 (*)    All other components within normal limits  ETHANOL - Abnormal; Notable for the following components:   Alcohol, Ethyl (B) 312 (*)    All other components within normal limits  CBC  URINE DRUG SCREEN, QUALITATIVE ([facility] ONLY)   ____________________________________________  EKG  none   ____________________________________________  RADIOLOGY  none  ____________________________________________   PROCEDURES  Procedure(s) performed: None Procedures Critical Care performed:  None ____________________________________________   INITIAL IMPRESSION / ASSESSMENT AND PLAN / ED COURSE   43 y.o. male with a history of depression alcohol abuse who presents for alcohol intoxication.  Patient is extremely intoxicated, not providing any history at this time.  Vitals are within normal limits with no fever, no respiratory distress, no cough.  Will monitor and reassess once sober.  Clinical Course as of Dec 23 1436  Wed Dec 24, 2018  1436 Labs showing alcohol level of 312 with no other acute findings.  Will monitor until sober for discharge.   [CV]    Clinical Course User Index [CV] Don Perking [place], MD     As part of my medical decision making, I reviewed the following data within the electronic MEDICAL RECORD NUMBER Nursing notes reviewed and incorporated, Labs reviewed , Old chart reviewed, Notes from prior ED visits and [place] Controlled Substance Database    Pertinent labs & imaging results that were available during my care of the patient were reviewed by me and considered in my medical decision making (see chart for details).    ____________________________________________   FINAL CLINICAL IMPRESSION(S) / ED DIAGNOSES  Final diagnoses:  Alcoholic intoxication with complication (HCC)      NEW MEDICATIONS STARTED DURING THIS VISIT:  ED Discharge Orders    None       Note:  This document was prepared using Dragon voice recognition software and may include unintentional dictation errors.    Don Perking, [place], MD 12/24/18 1438
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
## 2018-12-24 NOTE — ED Notes (Signed)
Pt yelling from his room - "go get me a drink - you dumb fat bitch nurse - fuck them pigs  - I am the doctor and I said go get me something to eat and to drink - right now  - I have been ringing this call bell for an hour."  Pt has the O2 saturation probe in his hand and mashes it as if to be calling out  - pt informed that that is for his VS   - "fuck you dumb bitch - go get me some food  - that is your job"

## 2018-12-24 NOTE — ED Notes (Signed)
PT told this tech to "watch this" said that he was "about to start shaking and spitting, watch what they are going to do." Pt then started laughing, and began shaking,spitting, and yelling.

## 2018-12-24 NOTE — ED Notes (Signed)
Cloths removed , 3 outerjackets , safety vest , green button up shirt , brown pants , shoes , red hat,

## 2018-12-24 NOTE — ED Notes (Signed)
The patients belongings were placed in the decon room due to the smell. The RN assigned to this patient is aware of this.

## 2018-12-24 NOTE — ED Triage Notes (Signed)
EMS pt , intoxicated " can not go to jail , cause he is intoxicated " , " I have been coughing , mask applied in tx room  Pt repeating himself ,

## 2018-12-24 NOTE — ED Notes (Signed)
Pt arousable to voice and able to answer time, place, person, and situation. Pt states he will walk home, but this RN states we would rather someone come get him. He is unable to tell me anyone to come pick him up at this time. Pt back to resting.

## 2019-02-24 ENCOUNTER — Other Ambulatory Visit: Payer: Self-pay

## 2019-02-24 ENCOUNTER — Emergency Department: Payer: Self-pay

## 2019-02-24 ENCOUNTER — Encounter: Payer: Self-pay | Admitting: Emergency Medicine

## 2019-02-24 ENCOUNTER — Emergency Department
Admission: EM | Admit: 2019-02-24 | Discharge: 2019-02-24 | Disposition: A | Discharge status: Home / Self Care | Payer: Self-pay | Attending: Emergency Medicine | Admitting: Emergency Medicine

## 2019-02-24 DIAGNOSIS — R112 Nausea with vomiting, unspecified: Secondary | ICD-10-CM | POA: Insufficient documentation

## 2019-02-24 DIAGNOSIS — Z20828 Contact with and (suspected) exposure to other viral communicable diseases: Secondary | ICD-10-CM | POA: Insufficient documentation

## 2019-02-24 DIAGNOSIS — R0981 Nasal congestion: Secondary | ICD-10-CM | POA: Insufficient documentation

## 2019-02-24 DIAGNOSIS — R197 Diarrhea, unspecified: Secondary | ICD-10-CM | POA: Insufficient documentation

## 2019-02-24 DIAGNOSIS — F1210 Cannabis abuse, uncomplicated: Secondary | ICD-10-CM | POA: Insufficient documentation

## 2019-02-24 DIAGNOSIS — B349 Viral infection, unspecified: Secondary | ICD-10-CM | POA: Insufficient documentation

## 2019-02-24 DIAGNOSIS — F17210 Nicotine dependence, cigarettes, uncomplicated: Secondary | ICD-10-CM | POA: Insufficient documentation

## 2019-02-24 DIAGNOSIS — R509 Fever, unspecified: Secondary | ICD-10-CM | POA: Insufficient documentation

## 2019-02-24 LAB — CBC WITH DIFFERENTIAL/PLATELET
Abs Immature Granulocytes: 0.02 10*3/uL (ref 0.00–0.07)
Basophils Absolute: 0.1 10*3/uL (ref 0.0–0.1)
Basophils Relative: 1 %
Eosinophils Absolute: 0.1 10*3/uL (ref 0.0–0.5)
Eosinophils Relative: 1 %
HCT: 42.3 % (ref 39.0–52.0)
Hemoglobin: 14.6 g/dL (ref 13.0–17.0)
Immature Granulocytes: 0 %
Lymphocytes Relative: 24 %
Lymphs Abs: 1.4 10*3/uL (ref 0.7–4.0)
MCH: 32.7 pg (ref 26.0–34.0)
MCHC: 34.5 g/dL (ref 30.0–36.0)
MCV: 94.6 fL (ref 80.0–100.0)
Monocytes Absolute: 0.5 10*3/uL (ref 0.1–1.0)
Monocytes Relative: 9 %
Neutro Abs: 3.9 10*3/uL (ref 1.7–7.7)
Neutrophils Relative %: 65 %
Platelets: 169 10*3/uL (ref 150–400)
RBC: 4.47 MIL/uL (ref 4.22–5.81)
RDW: 12.1 % (ref 11.5–15.5)
WBC: 6 10*3/uL (ref 4.0–10.5)
nRBC: 0 % (ref 0.0–0.2)

## 2019-02-24 LAB — URINALYSIS, COMPLETE (UACMP) WITH MICROSCOPIC
Bilirubin Urine: NEGATIVE
Glucose, UA: NEGATIVE mg/dL
Hgb urine dipstick: NEGATIVE
Ketones, ur: 5 mg/dL — AB
Leukocytes,Ua: NEGATIVE
Nitrite: NEGATIVE
Protein, ur: 30 mg/dL — AB
Specific Gravity, Urine: 1.019 (ref 1.005–1.030)
Squamous Epithelial / HPF: NONE SEEN (ref 0–5)
pH: 5 (ref 5.0–8.0)

## 2019-02-24 LAB — COMPREHENSIVE METABOLIC PANEL
ALT: 166 U/L — ABNORMAL HIGH (ref 0–44)
AST: 171 U/L — ABNORMAL HIGH (ref 15–41)
Albumin: 4.5 g/dL (ref 3.5–5.0)
Alkaline Phosphatase: 83 U/L (ref 38–126)
Anion gap: 14 (ref 5–15)
BUN: 10 mg/dL (ref 6–20)
CO2: 23 mmol/L (ref 22–32)
Calcium: 8.5 mg/dL — ABNORMAL LOW (ref 8.9–10.3)
Chloride: 102 mmol/L (ref 98–111)
Creatinine, Ser: 0.95 mg/dL (ref 0.61–1.24)
GFR calc Af Amer: >60 mL/min (ref >60)
GFR calc non Af Amer: >60 mL/min (ref >60)
Glucose, Bld: 87 mg/dL (ref 70–99)
Potassium: 3.5 mmol/L (ref 3.5–5.1)
Sodium: 139 mmol/L (ref 135–145)
Total Bilirubin: 0.9 mg/dL (ref 0.3–1.2)
Total Protein: 7.7 g/dL (ref 6.5–8.1)

## 2019-02-24 LAB — ETHANOL: Alcohol, Ethyl (B): 158 mg/dL — ABNORMAL HIGH (ref <10)

## 2019-02-24 LAB — SARS CORONAVIRUS 2 BY RT PCR (HOSPITAL ORDER, PERFORMED IN ~~LOC~~ HOSPITAL LAB): SARS Coronavirus 2: NEGATIVE

## 2019-02-24 LAB — TROPONIN I: Troponin I: <0.03 ng/mL (ref <0.03)

## 2019-02-24 MED ORDER — THIAMINE HCL 100 MG/ML IJ SOLN
Freq: Once | INTRAVENOUS | Status: AC
  Administered 2019-02-24: 04:00:00 via INTRAVENOUS
  Filled 2019-02-24: qty 1000

## 2019-02-24 MED ORDER — SODIUM CHLORIDE 0.9 % IV BOLUS: 1000.0000 mL | Freq: Once | INTRAVENOUS | Status: DC

## 2019-02-24 MED ORDER — IPRATROPIUM-ALBUTEROL 0.5-2.5 (3) MG/3ML IN SOLN
3.0000 mL | Freq: Once | RESPIRATORY_TRACT | Status: AC
  Administered 2019-02-24: 3 mL via RESPIRATORY_TRACT
  Filled 2019-02-24: qty 3

## 2019-02-24 MED ORDER — THIAMINE HCL 100 MG/ML IJ SOLN: 100.0000 mg | Freq: Once | INTRAMUSCULAR | Status: DC

## 2019-02-24 MED ORDER — ALBUTEROL SULFATE HFA 108 (90 BASE) MCG/ACT IN AERS: 2.0000 | INHALATION_SPRAY | Freq: Four times a day (QID) | RESPIRATORY_TRACT | 2 refills | Status: DC | PRN

## 2019-02-24 MED ORDER — ONDANSETRON 4 MG PO TBDP: 4.0000 mg | ORAL_TABLET | Freq: Three times a day (TID) | ORAL | 0 refills | Status: DC | PRN

## 2019-02-24 MED ORDER — ONDANSETRON HCL 4 MG/2ML IJ SOLN
4.0000 mg | Freq: Once | INTRAMUSCULAR | Status: AC
  Administered 2019-02-24: 4 mg via INTRAVENOUS
  Filled 2019-02-24: qty 2

## 2019-02-24 NOTE — ED Notes (Signed)
Pt has no ride home. Pt states he needs to rest. When trying to obtain ekg pt shaking. When pt covered up with blanket pt stops shaking.

## 2019-02-24 NOTE — ED Notes (Signed)
Pt commenting on this RN inappropriately. Pt is cooperative but this RN not safe with pt in a room to the back of nurse's station. Will move pt to room across. Pt keeps repeating phrases to himself and seems altered at times. Will continue to monitor.

## 2019-02-24 NOTE — ED Notes (Signed)
Pt moved to room 34 for staff safety.

## 2019-02-24 NOTE — ED Notes (Signed)
Report given to next shift nurse kate.

## 2019-02-24 NOTE — ED Notes (Signed)
Pt provided bus pas. Wheeled to lobby by this [employer] fundraiser. Had no further questions about DC instructions. Informed to follow up with [facility] when he asked about getting a physical exam performed.

## 2019-02-24 NOTE — ED Notes (Signed)
Pt to ED for dehydration. Pt is yelling and cursing at staff and telling them to start an IV. Pt redirected on his behavior and has become more compliant after consideration. Pt reports abd pain, left kidney pain, tightness in face, dehydration, chills, for 2 days. When asked when was the last time he has had anything to drink by mouth, pt states "a while". When asked pt to be more specific, pt became agitated assuming this Rn was asking about ETOH. This RN stated not just ETOH, but anything by mouth. Pt again stated "a while". Pt states he last had ETOH yesterday.

## 2019-02-24 NOTE — ED Provider Notes (Signed)
[facility] Emergency Department Provider Note  ____________________________________________  Time seen: Approximately 2:32 AM  I have reviewed the triage vital signs and the nursing notes.   HISTORY  Chief Complaint Cough; Nasal Congestion; and Abdominal Pain   HPI Richard Terrien Blythe Hartshorn. is a 43 y.o. male with history of alcohol abuse who presents for evaluation of cough and fever.  Patient reports several days of cough productive of yellow sputum, subjective fever and chills, diarrhea, nausea, vomiting.  Patient reports that he feels dehydrated from drinking alcohol, vomiting and diarrhea.  He denies drinking alcohol today.  He is complaining of diffuse cramping abdominal pain that has been ongoing for several days.  No dysuria or hematuria.  No chest pain or shortness of breath.  Past Medical History:  Diagnosis Date  . Depression    Allergies Patient has no known allergies.  Family History  Problem Relation Age of Onset  . Alcohol abuse Mother   . Depression Mother   . Mental illness Mother   . Mental illness Father   . Mental illness Sister   . HIV/AIDS Brother   . Mental illness Maternal Grandmother     Social History Social History   Tobacco Use  . Smoking status: Current Every Day Smoker    Types: Cigarettes  . Smokeless tobacco: Former Neurosurgeon    Types: Snuff  Substance Use Topics  . Alcohol use: Yes    Alcohol/week: 4.0 standard drinks    Types: 4 Cans of beer per week    Comment: Malt liquor or liquor every other weekend  . Drug use: Yes    Types: Marijuana    Review of Systems  Constitutional: + fever, chills Eyes: Negative for visual changes. ENT: Negative for sore throat. Neck: No neck pain  Cardiovascular: Negative for chest pain. Respiratory: Negative for shortness of breath. Gastrointestinal: + abdominal pain, vomiting and diarrhea. Genitourinary: Negative for dysuria. Musculoskeletal: Negative for back pain. Skin:  Negative for rash. Neurological: Negative for headaches, weakness or numbness. Psych: No SI or HI  ____________________________________________   PHYSICAL EXAM:  VITAL SIGNS: ED Triage Vitals  Enc Vitals Group     BP 02/24/19 0212 (!) 128/95     Pulse Rate 02/24/19 0212 (!) 108     Resp 02/24/19 0212 18     Temp 02/24/19 0212 98.1 F (36.7 C)     Temp Source 02/24/19 0212 Oral     SpO2 02/24/19 0212 97 %     Weight 02/24/19 0210 187 lb (84.8 kg)     Height 02/24/19 0210  (1.753 m)     Head Circumference --      Peak Flow --      Pain Score 02/24/19 0209 2     Pain Loc --      Pain Edu? --      Excl. in GC? --     Constitutional: Alert and oriented, intoxicated, no distress.  HEENT:      Head: Normocephalic and atraumatic.         Eyes: Conjunctivae are normal. Sclera is non-icteric.       Mouth/Throat: Mucous membranes are moist.       Neck: Supple with no signs of meningismus. Cardiovascular: Tachycardic with regular rhythm. No murmurs, gallops, or rubs. 2+ symmetrical distal pulses are present in all extremities. No JVD. Respiratory: Normal respiratory effort. Lungs are clear to auscultation bilaterally. No wheezes, crackles, or rhonchi.  Gastrointestinal: Soft, non tender, and non  distended with positive bowel sounds. No rebound or guarding. Musculoskeletal: Nontender with normal range of motion in all extremities. No edema, cyanosis, or erythema of extremities. Neurologic: Normal speech and language. Face is symmetric. Moving all extremities. No gross focal neurologic deficits are appreciated. Skin: Skin is warm, dry and intact. No rash noted. Psychiatric: Mood and affect are normal. Speech and behavior are normal.  ____________________________________________   LABS (all labs ordered are listed, but only abnormal results are displayed)  Labs Reviewed  COMPREHENSIVE METABOLIC PANEL - Abnormal; Notable for the following components:      Result Value    Calcium 8.5 (*)    AST 171 (*)    ALT 166 (*)    All other components within normal limits  ETHANOL - Abnormal; Notable for the following components:   Alcohol, Ethyl (B) 158 (*)    All other components within normal limits  URINALYSIS, COMPLETE (UACMP) WITH MICROSCOPIC - Abnormal; Notable for the following components:   Color, Urine YELLOW (*)    APPearance HAZY (*)    Ketones, ur 5 (*)    Protein, ur 30 (*)    Bacteria, UA RARE (*)    All other components within normal limits  SARS CORONAVIRUS 2 (HOSPITAL ORDER, PERFORMED IN [facility])  CBC WITH DIFFERENTIAL/PLATELET  TROPONIN I   ____________________________________________  EKG  ED ECG REPORT I, Nita Sickle, the attending physician, personally viewed and interpreted this ECG.  Sinus rhythm, rate of 94, normal intervals, normal axis, no ST elevations or depressions. ____________________________________________  RADIOLOGY  I have personally reviewed the images performed during this visit and I agree with the Radiologist's read.   Interpretation by Radiologist:  Dg Chest Portable 1 View  Result Date: 02/24/2019 CLINICAL DATA:  Chills and nonproductive cough EXAM: PORTABLE CHEST 1 VIEW COMPARISON:  12/02/2017 FINDINGS: The heart size and mediastinal contours are within normal limits. Both lungs are clear. The visualized skeletal structures are unremarkable. IMPRESSION: No active disease. Electronically Signed   By: Katherine Mantle M.D.   On: 02/24/2019 02:46     ____________________________________________   PROCEDURES  Procedure(s) performed: None Procedures Critical Care performed:  None ____________________________________________   INITIAL IMPRESSION / ASSESSMENT AND PLAN / ED COURSE   43 y.o. male with history of alcohol abuse who presents for evaluation of subjective fever, cough, chills, vomiting, diarrhea, cramping abdominal pain.  Patient is clearly intoxicated but continues to deny  him having alcohol today.  He is afebrile with normal work of breathing, lungs are clear to auscultation.  Slightly tachycardic and looks slightly dry on exam.  Differential diagnosis including viral illness versus pneumonia versus alcoholic ketoacidosis versus COVID.  Labs and chest x-ray are pending.  Will give a banana bag and Zofran.  Patient has no tenderness palpation on his abdomen.    _________________________ 4:28 AM on 02/24/2019 -----------------------------------------  Chest x-ray with no pneumonia.  COVID negative.  No leukocytosis.  LFTs elevated in the setting of alcohol abuse.  No evidence of ketoacidosis.  Patient with no episodes of vomiting or diarrhea in the emergency room.  He is tolerating p.o.  Will discharge home with an albuterol inhaler, Zofran, and [facility].  Discussed return precautions.  Will request sober ride home.   As part of my medical decision making, I reviewed the following data within the electronic MEDICAL RECORD NUMBER Nursing notes reviewed and incorporated, Labs reviewed , EKG interpreted , Old chart reviewed, Radiograph reviewed , Notes from prior ED  visits and [place] Controlled Substance Database    Pertinent labs & imaging results that were available during my care of the patient were reviewed by me and considered in my medical decision making (see chart for details).    ____________________________________________   FINAL CLINICAL IMPRESSION(S) / ED DIAGNOSES  Final diagnoses:  Viral illness      NEW MEDICATIONS STARTED DURING THIS VISIT:  ED Discharge Orders         Ordered    albuterol (VENTOLIN HFA) 108 (90 Base) MCG/ACT inhaler  Every 6 hours PRN     02/24/19 0425    ondansetron (ZOFRAN ODT) 4 MG disintegrating tablet  Every 8 hours PRN     02/24/19 0425           Note:  This document was prepared using Dragon voice recognition software and may include unintentional dictation errors.    Don PerkingVeronese, [place], MD  02/24/19 [phone]

## 2019-02-24 NOTE — ED Triage Notes (Addendum)
Pt to triage via w/c with no distress noted, mask in place; brought in by EMS from [facility]; +ETOH; pt reports here for chills, nonprod cough, congestion x 24hrs and lower abd pain

## 2019-03-02 ENCOUNTER — Other Ambulatory Visit: Payer: Self-pay

## 2019-03-02 ENCOUNTER — Emergency Department
Admission: EM | Admit: 2019-03-02 | Discharge: 2019-03-02 | Discharge status: Against Medical Advice | Payer: Self-pay | Attending: Emergency Medicine | Admitting: Emergency Medicine

## 2019-03-02 ENCOUNTER — Encounter: Payer: Self-pay | Admitting: Emergency Medicine

## 2019-03-02 DIAGNOSIS — F17210 Nicotine dependence, cigarettes, uncomplicated: Secondary | ICD-10-CM | POA: Insufficient documentation

## 2019-03-02 DIAGNOSIS — F10929 Alcohol use, unspecified with intoxication, unspecified: Secondary | ICD-10-CM | POA: Insufficient documentation

## 2019-03-02 DIAGNOSIS — Z79899 Other long term (current) drug therapy: Secondary | ICD-10-CM | POA: Insufficient documentation
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
## 2019-03-02 NOTE — ED Notes (Signed)
Pt seen walking out of ER by charge RN.  No IV was present.

## 2019-03-02 NOTE — ED Provider Notes (Signed)
[facility] Emergency Department Provider Note  ____________________________________________  Time seen: Approximately 2:51 AM  I have reviewed the triage vital signs and the nursing notes.   HISTORY  Chief Complaint Alcohol Intoxication  Level 5 caveat:  Portions of the history and physical were unable to be obtained due to alcohol intoxication   HPI Richard ProwsBilly W Shirlean SchleinLipscomb Jr. is a 43 y.o. male with a history of depression alcohol abuse was brought in by police for alcohol intoxication.  Patient is extremely intoxicated.  Denies suicidal homicidal ideation.  Patient making several obscene comments to police officer and nursing staff. Patient denies medical complaints.    Past Medical History:  Diagnosis Date  . Depression     Prior to Admission medications   Medication Sig Start Date End Date Taking? Authorizing Provider  albuterol (VENTOLIN HFA) 108 (90 Base) MCG/ACT inhaler Inhale 2 puffs into the lungs every 6 (six) hours as needed for wheezing or shortness of breath. 02/24/19   Nita SickleVeronese, [place], MD  ondansetron (ZOFRAN ODT) 4 MG disintegrating tablet Take 1 tablet (4 mg total) by mouth every 8 (eight) hours as needed. 02/24/19   Nita SickleVeronese, [place], MD    Allergies Patient has no known allergies.  Family History  Problem Relation Age of Onset  . Alcohol abuse Mother   . Depression Mother   . Mental illness Mother   . Mental illness Father   . Mental illness Sister   . HIV/AIDS Brother   . Mental illness Maternal Grandmother     Social History Social History   Tobacco Use  . Smoking status: Current Every Day Smoker    Types: Cigarettes  . Smokeless tobacco: Former NeurosurgeonUser    Types: Snuff  Substance Use Topics  . Alcohol use: Yes    Alcohol/week: 4.0 standard drinks    Types: 4 Cans of beer per week    Comment: Malt liquor or liquor every other weekend  . Drug use: Yes    Types: Marijuana    Review of Systems  Constitutional: Negative  for fever. + Intoxicated Cardiovascular: Negative for chest pain. Respiratory: Negative for shortness of breath. Gastrointestinal: Negative for abdominal pain, vomiting or diarrhea. Genitourinary: Negative for dysuria. Musculoskeletal: Negative for back pain. Skin: Negative for rash. Neurological: Negative for headaches, weakness or numbness. Psych: No SI or HI  ____________________________________________   PHYSICAL EXAM:  VITAL SIGNS: ED Triage Vitals  Enc Vitals Group     BP 03/02/19 0222 131/81     Pulse Rate 03/02/19 0222 96     Resp 03/02/19 0222 18     Temp 03/02/19 0222 98.5 F (36.9 C)     Temp Source 03/02/19 0222 Oral     SpO2 03/02/19 0222 96 %     Weight 03/02/19 0220 120 lb 13.8 oz (54.8 kg)     Height 03/02/19 0220 5\' 9"  (1.753 m)     Head Circumference --      Peak Flow --      Pain Score 03/02/19 0220 0     Pain Loc --      Pain Edu? --      Excl. in GC? --     Constitutional: Awake, extremely intoxicated, making obscene comments to nursing staff and police officers  HEENT:      Head: Normocephalic and atraumatic.         Eyes: Conjunctivae are normal. Sclera is non-icteric.       Mouth/Throat: Mucous membranes are moist.  Neck: Supple with no signs of meningismus. Cardiovascular: Regular rate and rhythm. No murmurs, gallops, or rubs.  Respiratory: Normal respiratory effort. Lungs are clear to auscultation bilaterally.  Gastrointestinal: Soft, non tender, and non distended with positive bowel sounds. No rebound or guarding. Musculoskeletal: Nontender with normal range of motion in all extremities. No edema, cyanosis, or erythema of extremities. Neurologic: Slurred speech. Face is symmetric. Moving all extremities. No gross focal neurologic deficits are appreciated. Skin: Skin is warm, dry and intact. No rash noted. Psychiatric: Mood and affect are normal.   ____________________________________________   LABS (all labs ordered are listed, but  only abnormal results are displayed)  Labs Reviewed - No data to display ____________________________________________  EKG  none  ____________________________________________  RADIOLOGY  none  ____________________________________________   PROCEDURES  Procedure(s) performed: None Procedures Critical Care performed:  None ____________________________________________   INITIAL IMPRESSION / ASSESSMENT AND PLAN / ED COURSE  43 y.o. male with a history of depression alcohol abuse was brought in by police for alcohol intoxication.  Patient extremely intoxicated but maintaining his airway, awake, very inappropriate behavior towards staff.  Patient does not meet IVC criteria.  Will monitor patient until sober and reevaluate      As part of my medical decision making, I reviewed the following data within the [place] notes reviewed and incorporated, Old chart reviewed, Notes from prior ED visits and [place] Controlled Substance Database    Pertinent labs & imaging results that were available during my care of the patient were reviewed by me and considered in my medical decision making (see chart for details).    ____________________________________________   FINAL CLINICAL IMPRESSION(S) / ED DIAGNOSES  Final diagnoses:  Alcoholic intoxication with complication ([place])      NEW MEDICATIONS STARTED DURING THIS VISIT:  ED Discharge Orders    None       Note:  This document was prepared using Dragon voice recognition software and may include unintentional dictation errors.    Alfred Levins, [place], MD 03/02/19 [phone]

## 2019-03-02 NOTE — ED Notes (Addendum)
Assessment: pt appears intoxicated. When asked "why are you here?". Pt states "don't let my penis get near your vagina, we'll get married". Pt with wet shoes and lower pants noted, pt declines to change out of shoes. Pt does deny suicidal ideation. resps unlabored. Pt denies pain. Skin warm and dry. Pt is confused, alert to self and place only at this time.

## 2019-05-01 ENCOUNTER — Emergency Department (HOSPITAL_COMMUNITY): Payer: No Typology Code available for payment source

## 2019-05-01 ENCOUNTER — Other Ambulatory Visit: Payer: Self-pay

## 2019-05-01 ENCOUNTER — Encounter (HOSPITAL_COMMUNITY): Payer: Self-pay | Admitting: *Deleted

## 2019-05-01 ENCOUNTER — Emergency Department (HOSPITAL_COMMUNITY)
Admission: EM | Admit: 2019-05-01 | Discharge: 2019-05-01 | Disposition: A | Discharge status: Home / Self Care | Payer: No Typology Code available for payment source | Attending: Emergency Medicine | Admitting: Emergency Medicine

## 2019-05-01 DIAGNOSIS — F17210 Nicotine dependence, cigarettes, uncomplicated: Secondary | ICD-10-CM | POA: Insufficient documentation

## 2019-05-01 DIAGNOSIS — Y929 Unspecified place or not applicable: Secondary | ICD-10-CM | POA: Diagnosis not present

## 2019-05-01 DIAGNOSIS — M7918 Myalgia, other site: Secondary | ICD-10-CM

## 2019-05-01 DIAGNOSIS — S46912A Strain of unspecified muscle, fascia and tendon at shoulder and upper arm level, left arm, initial encounter: Secondary | ICD-10-CM

## 2019-05-01 DIAGNOSIS — Y999 Unspecified external cause status: Secondary | ICD-10-CM | POA: Diagnosis not present

## 2019-05-01 DIAGNOSIS — R109 Unspecified abdominal pain: Secondary | ICD-10-CM | POA: Diagnosis not present

## 2019-05-01 DIAGNOSIS — S0081XA Abrasion of other part of head, initial encounter: Secondary | ICD-10-CM | POA: Diagnosis not present

## 2019-05-01 DIAGNOSIS — R0789 Other chest pain: Secondary | ICD-10-CM | POA: Insufficient documentation

## 2019-05-01 DIAGNOSIS — M25571 Pain in right ankle and joints of right foot: Secondary | ICD-10-CM | POA: Diagnosis not present

## 2019-05-01 DIAGNOSIS — M25512 Pain in left shoulder: Secondary | ICD-10-CM | POA: Diagnosis present

## 2019-05-01 DIAGNOSIS — R404 Transient alteration of awareness: Secondary | ICD-10-CM | POA: Insufficient documentation

## 2019-05-01 DIAGNOSIS — Y9301 Activity, walking, marching and hiking: Secondary | ICD-10-CM | POA: Diagnosis not present

## 2019-05-01 DIAGNOSIS — S0011XA Contusion of right eyelid and periocular area, initial encounter: Secondary | ICD-10-CM | POA: Insufficient documentation

## 2019-05-01 LAB — COMPREHENSIVE METABOLIC PANEL
ALT: 29 U/L (ref 0–44)
AST: 29 U/L (ref 15–41)
Albumin: 3.6 g/dL (ref 3.5–5.0)
Alkaline Phosphatase: 62 U/L (ref 38–126)
Anion gap: 10 (ref 5–15)
BUN: 7 mg/dL (ref 6–20)
CO2: 24 mmol/L (ref 22–32)
Calcium: 8.6 mg/dL — ABNORMAL LOW (ref 8.9–10.3)
Chloride: 108 mmol/L (ref 98–111)
Creatinine, Ser: 1 mg/dL (ref 0.61–1.24)
GFR calc Af Amer: >60 mL/min (ref >60)
GFR calc non Af Amer: >60 mL/min (ref >60)
Glucose, Bld: 99 mg/dL (ref 70–99)
Potassium: 3.3 mmol/L — ABNORMAL LOW (ref 3.5–5.1)
Sodium: 142 mmol/L (ref 135–145)
Total Bilirubin: 0.6 mg/dL (ref 0.3–1.2)
Total Protein: 5.9 g/dL — ABNORMAL LOW (ref 6.5–8.1)

## 2019-05-01 LAB — ETHANOL: Alcohol, Ethyl (B): <10 mg/dL (ref <10)

## 2019-05-01 LAB — URINALYSIS, ROUTINE W REFLEX MICROSCOPIC
Bilirubin Urine: NEGATIVE
Glucose, UA: NEGATIVE mg/dL
Hgb urine dipstick: NEGATIVE
Ketones, ur: 5 mg/dL — AB
Leukocytes,Ua: NEGATIVE
Nitrite: NEGATIVE
Protein, ur: NEGATIVE mg/dL
Specific Gravity, Urine: 1.041 — ABNORMAL HIGH (ref 1.005–1.030)
pH: 6 (ref 5.0–8.0)

## 2019-05-01 LAB — CBC
HCT: 42.5 % (ref 39.0–52.0)
Hemoglobin: 14.6 g/dL (ref 13.0–17.0)
MCH: 33.9 pg (ref 26.0–34.0)
MCHC: 34.4 g/dL (ref 30.0–36.0)
MCV: 98.6 fL (ref 80.0–100.0)
Platelets: 215 10*3/uL (ref 150–400)
RBC: 4.31 MIL/uL (ref 4.22–5.81)
RDW: 11.8 % (ref 11.5–15.5)
WBC: 9.3 10*3/uL (ref 4.0–10.5)
nRBC: 0 % (ref 0.0–0.2)

## 2019-05-01 LAB — SAMPLE TO BLOOD BANK

## 2019-05-01 LAB — CDS SEROLOGY

## 2019-05-01 MED ORDER — IBUPROFEN 600 MG PO TABS: 600.0000 mg | ORAL_TABLET | Freq: Four times a day (QID) | ORAL | 0 refills | Status: DC | PRN

## 2019-05-01 MED ORDER — IOHEXOL 300 MG/ML  SOLN
100.0000 mL | Freq: Once | INTRAMUSCULAR | Status: AC | PRN
  Administered 2019-05-01: 13:00:00 100 mL via INTRAVENOUS

## 2019-05-01 MED ORDER — METHOCARBAMOL 500 MG PO TABS: 1000.0000 mg | ORAL_TABLET | Freq: Three times a day (TID) | ORAL | 0 refills | Status: DC | PRN
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
## 2019-05-01 NOTE — ED Notes (Signed)
Returned from CT scan.

## 2019-05-01 NOTE — ED Notes (Signed)
Patient transported to CT 

## 2019-05-04 ENCOUNTER — Encounter: Payer: Self-pay | Admitting: Emergency Medicine

## 2020-02-04 NOTE — Congregational Nurse Program (Signed)
Client has complaints feeling bad, he had a fever a couple of days ago. Oral tempt 98.3. Vital signs taken. Face appears flushed and warm to touch. Lungs clear no wheezing noted no shortness of breath. Denies being near anyone with covid, denies sore throat. He is homeless and sleeping on the streets. Discussed allergies and the high pollen count. Will assist with open door application. Client states he has not seen a doctor for awhile. Vicks vapor rub given to him with instructions to put in his nose to block the pollen and help with breathing. He may need to have a covid test and information given for [facility]

## 2020-05-12 ENCOUNTER — Emergency Department
Admission: EM | Admit: 2020-05-12 | Discharge: 2020-05-13 | Disposition: A | Discharge status: Home / Self Care | Payer: Self-pay | Attending: Emergency Medicine | Admitting: Emergency Medicine

## 2020-05-12 ENCOUNTER — Other Ambulatory Visit: Payer: Self-pay

## 2020-05-12 DIAGNOSIS — R109 Unspecified abdominal pain: Secondary | ICD-10-CM | POA: Insufficient documentation

## 2020-05-12 DIAGNOSIS — F10129 Alcohol abuse with intoxication, unspecified: Secondary | ICD-10-CM | POA: Insufficient documentation

## 2020-05-12 DIAGNOSIS — Z5321 Procedure and treatment not carried out due to patient leaving prior to being seen by health care provider: Secondary | ICD-10-CM | POA: Insufficient documentation

## 2020-05-12 LAB — URINALYSIS, COMPLETE (UACMP) WITH MICROSCOPIC
Bacteria, UA: NONE SEEN
Bilirubin Urine: NEGATIVE
Glucose, UA: NEGATIVE mg/dL
Hgb urine dipstick: NEGATIVE
Ketones, ur: NEGATIVE mg/dL
Leukocytes,Ua: NEGATIVE
Nitrite: NEGATIVE
Protein, ur: NEGATIVE mg/dL
Specific Gravity, Urine: 1.012 (ref 1.005–1.030)
Squamous Epithelial / LPF: NONE SEEN (ref 0–5)
pH: 5 (ref 5.0–8.0)

## 2020-05-12 LAB — COMPREHENSIVE METABOLIC PANEL
ALT: 89 U/L — ABNORMAL HIGH (ref 0–44)
AST: 68 U/L — ABNORMAL HIGH (ref 15–41)
Albumin: 4 g/dL (ref 3.5–5.0)
Alkaline Phosphatase: 59 U/L (ref 38–126)
Anion gap: 9 (ref 5–15)
BUN: 8 mg/dL (ref 6–20)
CO2: 23 mmol/L (ref 22–32)
Calcium: 8.7 mg/dL — ABNORMAL LOW (ref 8.9–10.3)
Chloride: 106 mmol/L (ref 98–111)
Creatinine, Ser: 0.96 mg/dL (ref 0.61–1.24)
GFR calc Af Amer: >60 mL/min (ref >60)
GFR calc non Af Amer: >60 mL/min (ref >60)
Glucose, Bld: 101 mg/dL — ABNORMAL HIGH (ref 70–99)
Potassium: 3.6 mmol/L (ref 3.5–5.1)
Sodium: 138 mmol/L (ref 135–145)
Total Bilirubin: 0.8 mg/dL (ref 0.3–1.2)
Total Protein: 6.7 g/dL (ref 6.5–8.1)

## 2020-05-12 LAB — CBC
HCT: 41.8 % (ref 39.0–52.0)
Hemoglobin: 14.5 g/dL (ref 13.0–17.0)
MCH: 34 pg (ref 26.0–34.0)
MCHC: 34.7 g/dL (ref 30.0–36.0)
MCV: 97.9 fL (ref 80.0–100.0)
Platelets: 233 10*3/uL (ref 150–400)
RBC: 4.27 MIL/uL (ref 4.22–5.81)
RDW: 12.6 % (ref 11.5–15.5)
WBC: 7.1 10*3/uL (ref 4.0–10.5)
nRBC: 0 % (ref 0.0–0.2)

## 2020-05-12 LAB — LIPASE, BLOOD: Lipase: 50 U/L (ref 11–51)
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
## 2020-05-13 NOTE — ED Notes (Signed)
No answer when called several times from lobby 

## 2020-05-13 NOTE — ED Notes (Addendum)
No answer when called several times from lobby 

## 2020-05-16 ENCOUNTER — Ambulatory Visit: Payer: Self-pay | Attending: Internal Medicine

## 2020-05-16 DIAGNOSIS — Z23 Encounter for immunization: Secondary | ICD-10-CM

## 2020-05-16 NOTE — Progress Notes (Signed)
   [id number]  Name:  Richard Mclean.    MRN: [id number] DOB: 1976/01/27  05/16/2020  [name] was observed post Covid-19 immunization for 15 minutes without incident. He was provided with Vaccine Information Sheet and instruction to access the V-Safe system.   [name] was instructed to call 911 with any severe reactions post vaccine: Marland Kitchen Difficulty breathing  . Swelling of face and throat  . A fast heartbeat  . A bad rash all over body  . Dizziness and weakness   Immunizations Administered    Name Date Dose VIS Date Route   Moderna COVID-19 Vaccine 05/16/2020  2:10 PM 0.5 mL 08/2019 Intramuscular   Manufacturer: Gala Murdoch   Lot: [id number]   NDC: 74128-786-76

## 2020-06-04 ENCOUNTER — Other Ambulatory Visit: Payer: Self-pay

## 2020-06-04 ENCOUNTER — Emergency Department
Admission: EM | Admit: 2020-06-04 | Discharge: 2020-06-04 | Disposition: A | Discharge status: Home / Self Care | Payer: Self-pay | Attending: Emergency Medicine | Admitting: Emergency Medicine

## 2020-06-04 DIAGNOSIS — K2920 Alcoholic gastritis without bleeding: Secondary | ICD-10-CM | POA: Insufficient documentation

## 2020-06-04 DIAGNOSIS — Z79899 Other long term (current) drug therapy: Secondary | ICD-10-CM | POA: Insufficient documentation

## 2020-06-04 DIAGNOSIS — F17210 Nicotine dependence, cigarettes, uncomplicated: Secondary | ICD-10-CM | POA: Insufficient documentation

## 2020-06-04 DIAGNOSIS — K429 Umbilical hernia without obstruction or gangrene: Secondary | ICD-10-CM | POA: Insufficient documentation

## 2020-06-04 LAB — CBC
HCT: 44.4 % (ref 39.0–52.0)
Hemoglobin: 15.6 g/dL (ref 13.0–17.0)
MCH: 33.5 pg (ref 26.0–34.0)
MCHC: 35.1 g/dL (ref 30.0–36.0)
MCV: 95.5 fL (ref 80.0–100.0)
Platelets: 239 10*3/uL (ref 150–400)
RBC: 4.65 MIL/uL (ref 4.22–5.81)
RDW: 11.7 % (ref 11.5–15.5)
WBC: 7.9 10*3/uL (ref 4.0–10.5)
nRBC: 0 % (ref 0.0–0.2)

## 2020-06-04 LAB — COMPREHENSIVE METABOLIC PANEL
ALT: 46 U/L — ABNORMAL HIGH (ref 0–44)
AST: 35 U/L (ref 15–41)
Albumin: 4.3 g/dL (ref 3.5–5.0)
Alkaline Phosphatase: 52 U/L (ref 38–126)
Anion gap: 11 (ref 5–15)
BUN: 11 mg/dL (ref 6–20)
CO2: 23 mmol/L (ref 22–32)
Calcium: 9 mg/dL (ref 8.9–10.3)
Chloride: 106 mmol/L (ref 98–111)
Creatinine, Ser: 0.94 mg/dL (ref 0.61–1.24)
GFR calc Af Amer: >60 mL/min (ref >60)
GFR calc non Af Amer: >60 mL/min (ref >60)
Glucose, Bld: 82 mg/dL (ref 70–99)
Potassium: 3.5 mmol/L (ref 3.5–5.1)
Sodium: 140 mmol/L (ref 135–145)
Total Bilirubin: 0.9 mg/dL (ref 0.3–1.2)
Total Protein: 7 g/dL (ref 6.5–8.1)

## 2020-06-04 LAB — LIPASE, BLOOD: Lipase: 55 U/L — ABNORMAL HIGH (ref 11–51)

## 2020-06-04 MED ORDER — ONDANSETRON 4 MG PO TBDP
8.0000 mg | ORAL_TABLET | Freq: Once | ORAL | Status: AC
  Administered 2020-06-04: 8 mg via ORAL
  Filled 2020-06-04: qty 2

## 2020-06-04 MED ORDER — FAMOTIDINE 20 MG PO TABS: 20.0000 mg | ORAL_TABLET | Freq: Two times a day (BID) | ORAL | 0 refills | Status: DC

## 2020-06-04 MED ORDER — FAMOTIDINE 20 MG PO TABS
40.0000 mg | ORAL_TABLET | Freq: Once | ORAL | Status: AC
  Administered 2020-06-04: 40 mg via ORAL
  Filled 2020-06-04: qty 2

## 2020-06-04 NOTE — ED Notes (Signed)
Pt given sandwich meal tray

## 2020-06-04 NOTE — ED Provider Notes (Signed)
[facility] Emergency Department Provider Note  ____________________________________________  Time seen: Approximately 7:17 PM  I have reviewed the triage vital signs and the nursing notes.   HISTORY  Chief Complaint Abdominal Pain and Alcohol Intoxication    Level 5 Caveat: Portions of the History and Physical including HPI and review of systems are unable to be completely obtained due to patient being a poor historian   HPI Richard Mclean. is a 44 y.o. male with a history of depression who comes the ED complaining of abdominal pain today.  He notes that he has been smoking a lot and drinking heavily earlier today because it is his birthday.  This has him feeling generalized abdominal discomfort and nausea, but eating and drinking okay.  Denies lightheadedness or syncope.  No chest pain shortness of breath fevers or chills.  He also notes that he has an umbilical hernia.  Symptoms are waxing and waning without aggravating or alleviating factors.      Past Medical History:  Diagnosis Date   Depression      There are no problems to display for this patient.    History reviewed. No pertinent surgical history.   Prior to Admission medications   Medication Sig Start Date End Date Taking? Authorizing Provider  albuterol (VENTOLIN HFA) 108 (90 Base) MCG/ACT inhaler Inhale 2 puffs into the lungs every 6 (six) hours as needed for wheezing or shortness of breath. 02/24/19   Nita Sickle, MD  ibuprofen (ADVIL) 600 MG tablet Take 1 tablet (600 mg total) by mouth every 6 (six) hours as needed for moderate pain. 05/01/19   Loren Racer, MD  methocarbamol (ROBAXIN) 500 MG tablet Take 2 tablets (1,000 mg total) by mouth every 8 (eight) hours as needed. 05/01/19   Loren Racer, MD  ondansetron (ZOFRAN ODT) 4 MG disintegrating tablet Take 1 tablet (4 mg total) by mouth every 8 (eight) hours as needed. 02/24/19   Nita Sickle, MD      Allergies Patient has no known allergies.   Family History  Problem Relation Age of Onset   Alcohol abuse Mother    Depression Mother    Mental illness Mother    Mental illness Father    Mental illness Sister    HIV/AIDS Brother    Mental illness Maternal Grandmother     Social History Social History   Tobacco Use   Smoking status: Current Every Day Smoker    Types: Cigarettes   Smokeless tobacco: Never Used  Building services engineer Use: Every day  Substance Use Topics   Alcohol use: Yes    Comment: Malt liquor or liquor every other weekend   Drug use: Not Currently    Types: Marijuana    Review of Systems Level 5 Caveat: Portions of the History and Physical including HPI and review of systems are unable to be completely obtained due to patient being a poor historian   Constitutional:   No known fever.  ENT:   No rhinorrhea. Cardiovascular:   No chest pain or syncope. Respiratory:   No dyspnea or cough. Gastrointestinal:   Positive as above for abdominal pain.  Musculoskeletal:   Negative for focal pain or swelling ____________________________________________   PHYSICAL EXAM:  VITAL SIGNS: ED Triage Vitals  Enc Vitals Group     BP 06/04/20 1141 124/78     Pulse Rate 06/04/20 1141 88     Resp 06/04/20 1141 18     Temp 06/04/20 1141 98.9 F (  37.2 C)     Temp Source 06/04/20 1141 Oral     SpO2 06/04/20 1141 97 %     Weight 06/04/20 1137 210 lb (95.3 kg)     Height 06/04/20 1137 5\' 9"  (1.753 m)     Head Circumference --      Peak Flow --      Pain Score 06/04/20 1136 7     Pain Loc --      Pain Edu? --      Excl. in GC? --     Vital signs reviewed, nursing assessments reviewed.   Constitutional:   Alert and oriented. Non-toxic appearance. Eyes:   Conjunctivae are normal. EOMI. PERRL. ENT      Head:   Normocephalic and atraumatic.      Nose:   No congestion/rhinnorhea.       Mouth/Throat:   MMM, no pharyngeal erythema. No  peritonsillar mass.       Neck:   No meningismus. Full ROM. Hematological/Lymphatic/Immunilogical:   No cervical lymphadenopathy. Cardiovascular:   RRR. Symmetric bilateral radial and DP pulses.  No murmurs. Cap refill less than 2 seconds. Respiratory:   Normal respiratory effort without tachypnea/retractions. Breath sounds are clear and equal bilaterally. No wheezes/rales/rhonchi. Gastrointestinal:   Soft and nontender. Non distended. There is no CVA tenderness.  No rebound, rigidity, or guarding.  There is a 1 cm umbilical hernia which is soft and reducible.  Not incarcerated  Musculoskeletal:   Normal range of motion in all extremities. No joint effusions.  No lower extremity tenderness.  No edema. Neurologic:   Normal speech and language.  Motor grossly intact. No acute focal neurologic deficits are appreciated.  Skin:    Skin is warm, dry and intact. No rash noted.  No petechiae, purpura, or bullae.  ____________________________________________    LABS (pertinent positives/negatives) (all labs ordered are listed, but only abnormal results are displayed) Labs Reviewed  LIPASE, BLOOD - Abnormal; Notable for the following components:      Result Value   Lipase 55 (*)    All other components within normal limits  COMPREHENSIVE METABOLIC PANEL - Abnormal; Notable for the following components:   ALT 46 (*)    All other components within normal limits  CBC  URINALYSIS, COMPLETE (UACMP) WITH MICROSCOPIC   ____________________________________________   EKG    ____________________________________________    RADIOLOGY  No results found.  ____________________________________________   PROCEDURES Procedures  ____________________________________________  DIFFERENTIAL DIAGNOSIS   Gastritis, pancreatitis, dehydration, electrolyte abnormality, viral illness  CLINICAL IMPRESSION / ASSESSMENT AND PLAN / ED COURSE  Medications ordered in the ED: Medications  ondansetron  (ZOFRAN-ODT) disintegrating tablet 8 mg (8 mg Oral Given 06/04/20 1847)  famotidine (PEPCID) tablet 40 mg (40 mg Oral Given 06/04/20 1847)    Pertinent labs & imaging results that were available during my care of the patient were reviewed by me and considered in my medical decision making (see chart for details).   Richard Mclean. was evaluated in Emergency Department on 06/04/2020 for the symptoms described in the history of present illness. He was evaluated in the context of the global COVID-19 pandemic, which necessitated consideration that the patient might be at risk for infection with the SARS-CoV-2 virus that causes COVID-19. Institutional protocols and algorithms that pertain to the evaluation of patients at risk for COVID-19 are in a state of rapid change based on information released by regulatory bodies including the CDC and federal and state organizations. These policies and algorithms  were followed during the patient's care in the ED.   Patient presents with vague abdominal pain, reported vomiting and diarrhea.  Vital signs are normal, exam is reassuring, lab panel is normal.  Patient given a Pepcid and Zofran, feeling better, tolerating oral intake and stable for discharge home.  Counseled on avoiding drugs or heavy alcohol use and focusing on hydration.   Considering the patient's symptoms, medical history, and physical examination today, I have low suspicion for cholecystitis or biliary pathology, pancreatitis, perforation or bowel obstruction, hernia, intra-abdominal abscess, AAA or dissection, volvulus or intussusception, mesenteric ischemia, or appendicitis.        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 Dictation errors may occur despite best attempts at proofreading.   Sharman Cheek, MD 06/04/20 1921

## 2020-06-04 NOTE — ED Notes (Signed)
Reviewed discharge instructions, follow-up care, and prescriptions with patient. Patient verbalized understanding of all information reviewed. Patient stable, with no distress noted at this time.    

## 2020-06-04 NOTE — ED Triage Notes (Addendum)
Pt was BIB with c/o of "intestinal pain"- pt states he has a bad liver- pt states this has been going on since "the last time he was here" which he states was last week- pt states he has been having n/v/d with it- pt states he has been drinking and "smoking all kinds of cigarettes" since today is his birthday- pt homeless- pt speaking in nonsense

## 2020-06-04 NOTE — ED Notes (Signed)
Pt reports a pain "that circulates" all around; thinks he may be poisoned from environmental toxins. Today is his birthday; he has been drinking and smoking cigarettes; reports he does not have a home and "moves about."

## 2020-06-04 NOTE — Discharge Instructions (Signed)
Drink lots of water to stay well-hydrated, and avoid drug use or heavy alcohol use.  You should return to the ER if you develop sudden severe pain in the abdomen where you belly button hernia is.

## 2020-06-04 NOTE — ED Notes (Signed)
Patient eating meal tray with no issue.

## 2020-06-04 NOTE — ED Notes (Signed)
ED Provider at bedside. 
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
## 2020-07-08 ENCOUNTER — Encounter: Payer: Self-pay | Admitting: General Practice

## 2020-10-13 ENCOUNTER — Encounter (HOSPITAL_COMMUNITY): Payer: Self-pay | Admitting: Emergency Medicine

## 2020-10-13 ENCOUNTER — Emergency Department (HOSPITAL_COMMUNITY): Payer: HRSA Program

## 2020-10-13 ENCOUNTER — Emergency Department (HOSPITAL_COMMUNITY)
Admission: EM | Admit: 2020-10-13 | Discharge: 2020-10-14 | Disposition: A | Discharge status: Home / Self Care | Payer: HRSA Program | Attending: Emergency Medicine | Admitting: Emergency Medicine

## 2020-10-13 DIAGNOSIS — K2950 Unspecified chronic gastritis without bleeding: Secondary | ICD-10-CM | POA: Insufficient documentation

## 2020-10-13 DIAGNOSIS — U071 COVID-19: Secondary | ICD-10-CM | POA: Diagnosis not present

## 2020-10-13 DIAGNOSIS — F17210 Nicotine dependence, cigarettes, uncomplicated: Secondary | ICD-10-CM | POA: Diagnosis not present

## 2020-10-13 DIAGNOSIS — K2970 Gastritis, unspecified, without bleeding: Secondary | ICD-10-CM

## 2020-10-13 DIAGNOSIS — R072 Precordial pain: Secondary | ICD-10-CM | POA: Diagnosis present

## 2020-10-13 DIAGNOSIS — R079 Chest pain, unspecified: Secondary | ICD-10-CM

## 2020-10-13 LAB — TROPONIN I (HIGH SENSITIVITY)
Troponin I (High Sensitivity): 4 ng/L (ref <18)
Troponin I (High Sensitivity): 5 ng/L (ref <18)

## 2020-10-13 LAB — CBC
HCT: 40.1 % (ref 39.0–52.0)
Hemoglobin: 14 g/dL (ref 13.0–17.0)
MCH: 34.1 pg — ABNORMAL HIGH (ref 26.0–34.0)
MCHC: 34.9 g/dL (ref 30.0–36.0)
MCV: 97.8 fL (ref 80.0–100.0)
Platelets: 233 10*3/uL (ref 150–400)
RBC: 4.1 MIL/uL — ABNORMAL LOW (ref 4.22–5.81)
RDW: 12.1 % (ref 11.5–15.5)
WBC: 8.7 10*3/uL (ref 4.0–10.5)
nRBC: 0 % (ref 0.0–0.2)

## 2020-10-13 LAB — SARS CORONAVIRUS 2 BY RT PCR (HOSPITAL ORDER, PERFORMED IN ~~LOC~~ HOSPITAL LAB): SARS Coronavirus 2: POSITIVE — AB

## 2020-10-13 LAB — BASIC METABOLIC PANEL
Anion gap: 13 (ref 5–15)
BUN: 9 mg/dL (ref 6–20)
CO2: 21 mmol/L — ABNORMAL LOW (ref 22–32)
Calcium: 8.3 mg/dL — ABNORMAL LOW (ref 8.9–10.3)
Chloride: 107 mmol/L (ref 98–111)
Creatinine, Ser: 1.22 mg/dL (ref 0.61–1.24)
GFR, Estimated: >60 mL/min (ref >60)
Glucose, Bld: 77 mg/dL (ref 70–99)
Potassium: 3.2 mmol/L — ABNORMAL LOW (ref 3.5–5.1)
Sodium: 141 mmol/L (ref 135–145)

## 2020-10-13 NOTE — ED Triage Notes (Signed)
Patient BIB [facility] [place] with complaint of 6/10 left sided chest pressure. Received 324 mg ASA and 2x SL NTG, pain lowered to 4/10. Also complains of cough and shortness of breath.

## 2020-10-14 MED ORDER — PANTOPRAZOLE SODIUM 40 MG PO TBEC
40.0000 mg | DELAYED_RELEASE_TABLET | Freq: Once | ORAL | Status: AC
  Administered 2020-10-14: 40 mg via ORAL
  Filled 2020-10-14: qty 1

## 2020-10-14 MED ORDER — ONDANSETRON 4 MG PO TBDP: ORAL_TABLET | ORAL | 0 refills | Status: DC

## 2020-10-14 MED ORDER — ALUM & MAG HYDROXIDE-SIMETH 200-200-20 MG/5ML PO SUSP
30.0000 mL | Freq: Once | ORAL | Status: AC
  Administered 2020-10-14: 30 mL via ORAL
  Filled 2020-10-14: qty 30

## 2020-10-14 MED ORDER — PANTOPRAZOLE SODIUM 20 MG PO TBEC: 20.0000 mg | DELAYED_RELEASE_TABLET | Freq: Every day | ORAL | 0 refills | Status: DC

## 2020-10-14 MED ORDER — ALUM & MAG HYDROXIDE-SIMETH 400-400-40 MG/5ML PO SUSP: 15.0000 mL | Freq: Four times a day (QID) | ORAL | 0 refills | Status: DC | PRN

## 2020-10-14 MED ORDER — ONDANSETRON 4 MG PO TBDP
8.0000 mg | ORAL_TABLET | Freq: Once | ORAL | Status: AC
  Administered 2020-10-14: 8 mg via ORAL
  Filled 2020-10-14: qty 2

## 2020-10-14 NOTE — ED Notes (Signed)
E-signature pad unavailable at time of pt discharge. This RN discussed discharge materials with pt and answered all pt questions. Pt stated understanding of discharge material. ? ?

## 2020-10-14 NOTE — ED Provider Notes (Signed)
[facility] EMERGENCY DEPARTMENT Provider Note   CSN: [id number] Arrival date & time: 10/13/20  1855     History Chief Complaint  Patient presents with  . Chest Pain    Richard Mclean. is a 45 y.o. male.  Lives in shelter, no known sick contacts   Chest Pain Pain location:  Substernal area Pain quality: aching, pressure and sharp   Pain radiates to:  Does not radiate Pain severity:  Mild Duration:  2 days Timing:  Constant Context: not movement and not at rest   Relieved by:  None tried Worsened by:  Nothing Ineffective treatments:  None tried Associated symptoms: cough and shortness of breath   Associated symptoms: no abdominal pain and no fever        Past Medical History:  Diagnosis Date  . Depression     There are no problems to display for this patient.   History reviewed. No pertinent surgical history.     Family History  Problem Relation Age of Onset  . Alcohol abuse Mother   . Depression Mother   . Mental illness Mother   . Mental illness Father   . Mental illness Sister   . HIV/AIDS Brother   . Mental illness Maternal Grandmother     Social History   Tobacco Use  . Smoking status: Current Every Day Smoker    Types: Cigarettes  . Smokeless tobacco: Never Used  Vaping Use  . Vaping Use: Every day  Substance Use Topics  . Alcohol use: Yes    Comment: Malt liquor or liquor every other weekend  . Drug use: Not Currently    Types: Marijuana    Home Medications Prior to Admission medications   Medication Sig Start Date End Date Taking? Authorizing Provider  alum & mag hydroxide-simeth (MAALOX ADVANCED MAX ST) 400-400-40 MG/5ML suspension Take 15 mLs by mouth every 6 (six) hours as needed for indigestion. 10/14/20  Yes Randa Riss, Barbara Cower, MD  ondansetron (ZOFRAN ODT) 4 MG disintegrating tablet 4mg  ODT q4 hours prn nausea/vomit 10/14/20  Yes Tarrin Lebow, 10/16/20, MD  pantoprazole (PROTONIX) 20 MG tablet Take 1 tablet (20 mg  total) by mouth daily. 10/14/20  Yes Nevae Pinnix, 10/16/20, MD  albuterol (VENTOLIN HFA) 108 (90 Base) MCG/ACT inhaler Inhale 2 puffs into the lungs every 6 (six) hours as needed for wheezing or shortness of breath. 02/24/19   04/26/19, MD  famotidine (PEPCID) 20 MG tablet Take 1 tablet (20 mg total) by mouth 2 (two) times daily. 06/04/20   08/04/20, MD    Allergies    Patient has no known allergies.  Review of Systems   Review of Systems  Constitutional: Negative for fever.  Respiratory: Positive for cough and shortness of breath.   Cardiovascular: Positive for chest pain.  Gastrointestinal: Negative for abdominal pain.  All other systems reviewed and are negative.   Physical Exam Updated Vital Signs BP 118/77   Pulse 87   Temp 98.2 F (36.8 C) (Oral)   Resp 17   Ht 5\' 9"  (1.753 m)   Wt 93 kg   SpO2 91%   BMI 30.27 kg/m   Physical Exam Vitals and nursing note reviewed.  Constitutional:      Appearance: He is well-developed and well-nourished.  HENT:     Head: Normocephalic and atraumatic.     Mouth/Throat:     Mouth: Mucous membranes are moist.     Pharynx: Oropharynx is clear.  Eyes:     Pupils:  Pupils are equal, round, and reactive to light.  Cardiovascular:     Rate and Rhythm: Normal rate.  Pulmonary:     Effort: Pulmonary effort is normal. No respiratory distress.  Abdominal:     General: Abdomen is flat. There is no distension.  Musculoskeletal:        General: Normal range of motion.     Cervical back: Normal range of motion.  Skin:    General: Skin is warm and dry.  Neurological:     General: No focal deficit present.     Mental Status: He is alert.     ED Results / Procedures / Treatments   Labs (all labs ordered are listed, but only abnormal results are displayed) Labs Reviewed  SARS CORONAVIRUS 2 BY RT PCR (HOSPITAL ORDER, PERFORMED IN [facility]) - Abnormal; Notable for the following components:      Result Value   SARS  Coronavirus 2 POSITIVE (*)    All other components within normal limits  BASIC METABOLIC PANEL - Abnormal; Notable for the following components:   Potassium 3.2 (*)    CO2 21 (*)    Calcium 8.3 (*)    All other components within normal limits  CBC - Abnormal; Notable for the following components:   RBC 4.10 (*)    MCH 34.1 (*)    All other components within normal limits  TROPONIN I (HIGH SENSITIVITY)  TROPONIN I (HIGH SENSITIVITY)    EKG EKG Interpretation  Date/Time:  Thursday October 13 2020 18:59:02 EST Ventricular Rate:  119 PR Interval:  132 QRS Duration: 86 QT Interval:  326 QTC Calculation: 458 R Axis:   88 Text Interpretation: Sinus tachycardia Otherwise normal ECG Confirmed by Marily Memos [phone]) on 10/14/2020 3:19:23 AM   Radiology DG Chest Port 1 View  Result Date: 10/13/2020 CLINICAL DATA:  Chills, possibly COVID. EXAM: PORTABLE CHEST 1 VIEW COMPARISON:  Chest x-ray 02/24/2019 FINDINGS: The heart size and mediastinal contours are within normal limits. No focal consolidation. No pulmonary edema. No pleural effusion. No pneumothorax. No acute osseous abnormality. IMPRESSION: No active disease. Electronically Signed   By: Tish Frederickson M.D.   On: 10/13/2020 19:56    Procedures Procedures (including critical care time)  Medications Ordered in ED Medications  alum & mag hydroxide-simeth (MAALOX/MYLANTA) 200-200-20 MG/5ML suspension 30 mL (30 mLs Oral Given 10/14/20 0330)  pantoprazole (PROTONIX) EC tablet 40 mg (40 mg Oral Given 10/14/20 0330)  ondansetron (ZOFRAN-ODT) disintegrating tablet 8 mg (8 mg Oral Given 10/14/20 0330)    ED Course  I have reviewed the triage vital signs and the nursing notes.  Pertinent labs & imaging results that were available during my care of the patient were reviewed by me and considered in my medical decision making (see chart for details).    MDM Rules/Calculators/A&P                          Covid without distress.   Patient has no signs of pulmonary emboli.  There is mildly hypoxic when sleeping around 91-92%.  When he is awake he is above 95% consistently.  No focal abnormal breath sounds.  Will DC on symptomatic treatment for his gastritis as well low suspicion for any ruptured ulcer or other emergent related symptoms.. Final Clinical Impression(s) / ED Diagnoses Final diagnoses:  COVID-19  Gastritis without bleeding, unspecified chronicity, unspecified gastritis type    Rx / DC Orders ED Discharge Orders  Ordered    alum & mag hydroxide-simeth (MAALOX ADVANCED MAX ST) 400-400-40 MG/5ML suspension  Every 6 hours PRN        10/14/20 0503    ondansetron (ZOFRAN ODT) 4 MG disintegrating tablet        10/14/20 0503    pantoprazole (PROTONIX) 20 MG tablet  Daily        10/14/20 0503           Reilley Latorre, Barbara Cower, MD 10/14/20 [phone]

## 2020-10-19 ENCOUNTER — Emergency Department (HOSPITAL_COMMUNITY): Admission: EM | Admit: 2020-10-19 | Discharge: 2020-10-19 | Discharge status: Against Medical Advice | Payer: Self-pay

## 2020-10-19 NOTE — ED Notes (Signed)
Called for triage, no answer x2

## 2020-10-19 NOTE — ED Notes (Signed)
Called pt for triage no answer x3 

## 2020-10-20 ENCOUNTER — Emergency Department (HOSPITAL_COMMUNITY): Payer: Self-pay

## 2020-10-20 ENCOUNTER — Other Ambulatory Visit: Payer: Self-pay

## 2020-10-20 ENCOUNTER — Emergency Department (HOSPITAL_COMMUNITY)
Admission: EM | Admit: 2020-10-20 | Discharge: 2020-10-20 | Disposition: A | Discharge status: Home / Self Care | Payer: Self-pay | Attending: Emergency Medicine | Admitting: Emergency Medicine

## 2020-10-20 DIAGNOSIS — S20211A Contusion of right front wall of thorax, initial encounter: Secondary | ICD-10-CM

## 2020-10-20 DIAGNOSIS — F10920 Alcohol use, unspecified with intoxication, uncomplicated: Secondary | ICD-10-CM

## 2020-10-20 DIAGNOSIS — S80211A Abrasion, right knee, initial encounter: Secondary | ICD-10-CM

## 2020-10-20 DIAGNOSIS — S5001XA Contusion of right elbow, initial encounter: Secondary | ICD-10-CM

## 2020-10-20 DIAGNOSIS — F17210 Nicotine dependence, cigarettes, uncomplicated: Secondary | ICD-10-CM | POA: Insufficient documentation

## 2020-10-20 DIAGNOSIS — F10220 Alcohol dependence with intoxication, uncomplicated: Secondary | ICD-10-CM | POA: Insufficient documentation

## 2020-10-20 DIAGNOSIS — R7401 Elevation of levels of liver transaminase levels: Secondary | ICD-10-CM

## 2020-10-20 DIAGNOSIS — Y908 Blood alcohol level of 240 mg/100 ml or more: Secondary | ICD-10-CM | POA: Insufficient documentation

## 2020-10-20 DIAGNOSIS — Z8616 Personal history of COVID-19: Secondary | ICD-10-CM | POA: Insufficient documentation

## 2020-10-20 DIAGNOSIS — Z23 Encounter for immunization: Secondary | ICD-10-CM | POA: Insufficient documentation

## 2020-10-20 LAB — CBC WITH DIFFERENTIAL/PLATELET
Abs Immature Granulocytes: 0.03 10*3/uL (ref 0.00–0.07)
Basophils Absolute: 0 10*3/uL (ref 0.0–0.1)
Basophils Relative: 0 %
Eosinophils Absolute: 0.1 10*3/uL (ref 0.0–0.5)
Eosinophils Relative: 1 %
HCT: 45.7 % (ref 39.0–52.0)
Hemoglobin: 15.2 g/dL (ref 13.0–17.0)
Immature Granulocytes: 0 %
Lymphocytes Relative: 14 %
Lymphs Abs: 1.5 10*3/uL (ref 0.7–4.0)
MCH: 32.8 pg (ref 26.0–34.0)
MCHC: 33.3 g/dL (ref 30.0–36.0)
MCV: 98.7 fL (ref 80.0–100.0)
Monocytes Absolute: 0.6 10*3/uL (ref 0.1–1.0)
Monocytes Relative: 5 %
Neutro Abs: 8.9 10*3/uL — ABNORMAL HIGH (ref 1.7–7.7)
Neutrophils Relative %: 80 %
Platelets: 237 10*3/uL (ref 150–400)
RBC: 4.63 MIL/uL (ref 4.22–5.81)
RDW: 12.3 % (ref 11.5–15.5)
WBC: 11.1 10*3/uL — ABNORMAL HIGH (ref 4.0–10.5)
nRBC: 0 % (ref 0.0–0.2)

## 2020-10-20 LAB — COMPREHENSIVE METABOLIC PANEL
ALT: 54 U/L — ABNORMAL HIGH (ref 0–44)
AST: 55 U/L — ABNORMAL HIGH (ref 15–41)
Albumin: 3.7 g/dL (ref 3.5–5.0)
Alkaline Phosphatase: 44 U/L (ref 38–126)
Anion gap: 10 (ref 5–15)
BUN: 6 mg/dL (ref 6–20)
CO2: 24 mmol/L (ref 22–32)
Calcium: 8.4 mg/dL — ABNORMAL LOW (ref 8.9–10.3)
Chloride: 106 mmol/L (ref 98–111)
Creatinine, Ser: 1.02 mg/dL (ref 0.61–1.24)
GFR, Estimated: >60 mL/min (ref >60)
Glucose, Bld: 104 mg/dL — ABNORMAL HIGH (ref 70–99)
Potassium: 4.1 mmol/L (ref 3.5–5.1)
Sodium: 140 mmol/L (ref 135–145)
Total Bilirubin: 0.7 mg/dL (ref 0.3–1.2)
Total Protein: 6.1 g/dL — ABNORMAL LOW (ref 6.5–8.1)

## 2020-10-20 LAB — ETHANOL: Alcohol, Ethyl (B): 251 mg/dL — ABNORMAL HIGH (ref <10)

## 2020-10-20 MED ORDER — LORAZEPAM 1 MG PO TABS
2.0000 mg | ORAL_TABLET | Freq: Once | ORAL | Status: AC
  Administered 2020-10-20: 2 mg via ORAL
  Filled 2020-10-20: qty 2

## 2020-10-20 MED ORDER — TETANUS-DIPHTH-ACELL PERTUSSIS 5-2.5-18.5 LF-MCG/0.5 IM SUSY
0.5000 mL | PREFILLED_SYRINGE | Freq: Once | INTRAMUSCULAR | Status: AC
  Administered 2020-10-20: 0.5 mL via INTRAMUSCULAR
  Filled 2020-10-20: qty 0.5

## 2020-10-20 MED ORDER — ZIPRASIDONE MESYLATE 20 MG IM SOLR
20.0000 mg | Freq: Once | INTRAMUSCULAR | Status: AC
  Administered 2020-10-20: 20 mg via INTRAMUSCULAR
  Filled 2020-10-20: qty 20

## 2020-10-20 MED ORDER — STERILE WATER FOR INJECTION IJ SOLN
INTRAMUSCULAR | Status: AC
  Filled 2020-10-20: qty 10

## 2020-10-20 MED ORDER — IOHEXOL 300 MG/ML  SOLN
100.0000 mL | Freq: Once | INTRAMUSCULAR | Status: AC | PRN
  Administered 2020-10-20: 100 mL via INTRAVENOUS

## 2020-10-20 NOTE — ED Notes (Addendum)
Pt awake at this time, provided [place] sandwich & water
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
## 2020-10-20 NOTE — Discharge Instructions (Addendum)
Apply ice to any sore area.  Ice should be applied for 30 minutes at a time, 4 times a day.  Take ibuprofen or acetaminophen as needed for pain.  Your blood tests show that you are drinking is harming your liver.  If he did not stop drinking, you may go on to develop cirrhosis of the liver.

## 2020-10-20 NOTE — ED Notes (Signed)
Pt is alert and oriented at this time, gcs is 15. Pt is able to ambulate without difficulty
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
## 2021-02-12 ENCOUNTER — Emergency Department
Admission: EM | Admit: 2021-02-12 | Discharge: 2021-02-13 | Disposition: A | Discharge status: Home / Self Care | Payer: Self-pay | Attending: Emergency Medicine | Admitting: Emergency Medicine

## 2021-02-12 ENCOUNTER — Other Ambulatory Visit: Payer: Self-pay

## 2021-02-12 DIAGNOSIS — F17210 Nicotine dependence, cigarettes, uncomplicated: Secondary | ICD-10-CM | POA: Insufficient documentation

## 2021-02-12 DIAGNOSIS — Y908 Blood alcohol level of 240 mg/100 ml or more: Secondary | ICD-10-CM | POA: Insufficient documentation

## 2021-02-12 DIAGNOSIS — F10920 Alcohol use, unspecified with intoxication, uncomplicated: Secondary | ICD-10-CM | POA: Insufficient documentation

## 2021-02-12 LAB — COMPREHENSIVE METABOLIC PANEL
ALT: 24 U/L (ref 0–44)
AST: 24 U/L (ref 15–41)
Albumin: 4 g/dL (ref 3.5–5.0)
Alkaline Phosphatase: 52 U/L (ref 38–126)
Anion gap: 10 (ref 5–15)
BUN: 8 mg/dL (ref 6–20)
CO2: 20 mmol/L — ABNORMAL LOW (ref 22–32)
Calcium: 8.4 mg/dL — ABNORMAL LOW (ref 8.9–10.3)
Chloride: 113 mmol/L — ABNORMAL HIGH (ref 98–111)
Creatinine, Ser: 0.85 mg/dL (ref 0.61–1.24)
GFR, Estimated: >60 mL/min (ref >60)
Glucose, Bld: 98 mg/dL (ref 70–99)
Potassium: 3.6 mmol/L (ref 3.5–5.1)
Sodium: 143 mmol/L (ref 135–145)
Total Bilirubin: 0.6 mg/dL (ref 0.3–1.2)
Total Protein: 6.5 g/dL (ref 6.5–8.1)

## 2021-02-12 LAB — CBC
HCT: 45.9 % (ref 39.0–52.0)
Hemoglobin: 16 g/dL (ref 13.0–17.0)
MCH: 34.2 pg — ABNORMAL HIGH (ref 26.0–34.0)
MCHC: 34.9 g/dL (ref 30.0–36.0)
MCV: 98.1 fL (ref 80.0–100.0)
Platelets: 254 10*3/uL (ref 150–400)
RBC: 4.68 MIL/uL (ref 4.22–5.81)
RDW: 13 % (ref 11.5–15.5)
WBC: 11.7 10*3/uL — ABNORMAL HIGH (ref 4.0–10.5)
nRBC: 0 % (ref 0.0–0.2)

## 2021-02-12 LAB — SALICYLATE LEVEL: Salicylate Lvl: <7 mg/dL — ABNORMAL LOW (ref 7.0–30.0)

## 2021-02-12 LAB — ETHANOL: Alcohol, Ethyl (B): 343 mg/dL (ref <10)

## 2021-02-12 LAB — ACETAMINOPHEN LEVEL: Acetaminophen (Tylenol), Serum: <10 ug/mL — ABNORMAL LOW (ref 10–30)
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
## 2021-02-12 NOTE — ED Provider Notes (Signed)
[facility] Emergency Department Provider Note    ____________________________________________   I have reviewed the triage vital signs and the nursing notes.   HISTORY  Chief Complaint Alcohol Intoxication   History limited by and level 5 caveat due to: Intoxication   HPI Richard Mclean. is a 45 y.o. male who presents to the emergency department today with concerns for alcohol intoxication and apparently patient mentioned some suicidal ideation to the police.  On my exam patient cannot give any history.  Does appear to be extremely intoxicated.    Records reviewed. Per medical record review patient has a history of depression, alcohol abuse.   Past Medical History:  Diagnosis Date  . Depression     There are no problems to display for this patient.   No past surgical history on file.  Prior to Admission medications   Not on File    Allergies Patient has no known allergies.  Family History  Problem Relation Age of Onset  . Alcohol abuse Mother   . Depression Mother   . Mental illness Mother   . Mental illness Father   . Mental illness Sister   . HIV/AIDS Brother   . Mental illness Maternal Grandmother     Social History Social History   Tobacco Use  . Smoking status: Current Every Day Smoker    Types: Cigarettes  . Smokeless tobacco: Never Used  Vaping Use  . Vaping Use: Every day  Substance Use Topics  . Alcohol use: Yes    Comment: Malt liquor or liquor every other weekend  . Drug use: Not Currently    Types: Marijuana    Review of Systems Unable to obtain reliable ROS secondary to intoxication. ____________________________________________   PHYSICAL EXAM:  VITAL SIGNS: ED Triage Vitals  Enc Vitals Group     BP 02/12/21 2127 115/90     Pulse Rate 02/12/21 2127 (!) 113     Resp 02/12/21 2127 12     Temp 02/12/21 2127 98.4 F (36.9 C)     Temp Source 02/12/21 2127 Oral     SpO2 02/12/21 2127 97 %      Weight 02/12/21 2126 270 lb (122.5 kg)     Height 02/12/21 2126 6' (1.829 m)     Head Circumference --      Peak Flow --      Pain Score --      Pain Loc --      Pain Edu? --      Excl. in GC? --      Constitutional: Intoxicated. Eyes: Conjunctivae are normal.  ENT      Head: Normocephalic and atraumatic.      Nose: No congestion/rhinnorhea.      Mouth/Throat: Mucous membranes are moist.      Neck: No stridor. Hematological/Lymphatic/Immunilogical: No cervical lymphadenopathy. Cardiovascular: Normal rate, regular rhythm.  No murmurs, rubs, or gallops.  Respiratory: Normal respiratory effort without tachypnea nor retractions. Breath sounds are clear and equal bilaterally. No wheezes/rales/rhonchi. Gastrointestinal: Soft and non tender. No rebound. No guarding.  Genitourinary: Deferred Musculoskeletal: Normal range of motion in all extremities. No lower extremity edema. Neurologic:  Intoxicated. Able to move all extremities. Skin:  Skin is warm, dry and intact. No rash noted.   ____________________________________________    LABS (pertinent positives/negatives)  CBC wbc 11.7, hgb 16.0, plt 254 CMP wnl except cl 113, co2 20, ca 8.4 Ethanol 343 Acetaminophe, salicylate below threshold ____________________________________________   EKG  None  ____________________________________________    RADIOLOGY  None  ____________________________________________   PROCEDURES  Procedures  ____________________________________________   INITIAL IMPRESSION / ASSESSMENT AND PLAN / ED COURSE  Pertinent labs & imaging results that were available during my care of the patient were reviewed by me and considered in my medical decision making (see chart for details).   Patient presents to the emergency department today brought in by Police Department because of concerns for alcohol intoxication and verbalization of suicidal ideation.  I am unable to get any history from patient  at this time.  He is quite intoxicated.  Will let patient sober up in the emergency department and reassess.  ____________________________________________   FINAL CLINICAL IMPRESSION(S) / ED DIAGNOSES  Final diagnoses:  Alcoholic intoxication without complication (HCC)     Note: This dictation was prepared with Dragon dictation. Any transcriptional errors that result from this process are unintentional     Phineas Semen, MD 02/12/21 2313

## 2021-02-13 MED ORDER — FOLIC ACID 1 MG PO TABS: 1.0000 mg | ORAL_TABLET | Freq: Every day | ORAL | 3 refills | Status: AC

## 2021-02-13 MED ORDER — THIAMINE HCL 100 MG PO TABS: 100.0000 mg | ORAL_TABLET | Freq: Every day | ORAL | 3 refills | Status: DC

## 2021-02-13 MED ORDER — ONE-A-DAY MENS PO TABS: 1.0000 | ORAL_TABLET | Freq: Every day | ORAL | 3 refills | Status: DC

## 2021-02-13 NOTE — Discharge Instructions (Signed)
Steps to find a Primary Care Provider (PCP):  Call [phone] or [phone] to access "[place] Find a Doctor Service."  2.  You may also go on the [facility] website at [email or web address]  

## 2021-02-13 NOTE — ED Notes (Signed)
ambulated up to bathroom and changed into blue scrubs. Attempting to call uncle for ride home for patient.

## 2021-02-13 NOTE — ED Notes (Signed)
Discharged. Ambulates safely. Left with all of belongings. EDP approval for patient to be discharged at this time.

## 2021-02-13 NOTE — ED Provider Notes (Signed)
  Physical Exam  BP 115/90   Pulse (!) 113   Temp 98.4 F (36.9 C) (Oral)   Resp 12   Ht 6' (1.829 m)   Wt 122.5 kg   SpO2 97%   BMI 36.62 kg/m   Physical Exam  ED Course/Procedures     Procedures  MDM  2:30 AM  Assumed care.  Patient here for alcohol intoxication.  Reports of possible suicidal ideation.  On reevaluation, patient is now awake, alert and does not appear significantly intoxicated.  He is laughing, joking.  Denies any complaints.  Denies SI, HI.  Does not appear to be responding to internal stimuli.  Have offered detox/rehabilitation which he declined stating that he is comfortable with plan for discharge home.   At this time, I do not feel there is any life-threatening condition present. I have reviewed, interpreted and discussed all results (EKG, imaging, lab, urine as appropriate) and exam findings with patient/family. I have reviewed nursing notes and appropriate previous records.  I feel the patient is safe to be discharged home without further emergent workup and can continue workup as an outpatient as needed. Discussed usual and customary return precautions. Patient/family verbalize understanding and are comfortable with this plan.  Outpatient follow-up has been provided as needed. All questions have been answered.       Makaela Cando, Layla Maw, DO 02/13/21 [phone]

## 2021-02-13 NOTE — ED Notes (Signed)
Attempting to call for patient ride home.

## 2021-04-29 ENCOUNTER — Encounter: Payer: Self-pay | Admitting: Emergency Medicine

## 2021-04-29 ENCOUNTER — Other Ambulatory Visit: Payer: Self-pay

## 2021-04-29 DIAGNOSIS — W228XXA Striking against or struck by other objects, initial encounter: Secondary | ICD-10-CM | POA: Insufficient documentation

## 2021-04-29 DIAGNOSIS — Y9301 Activity, walking, marching and hiking: Secondary | ICD-10-CM | POA: Insufficient documentation

## 2021-04-29 DIAGNOSIS — Y92214 College as the place of occurrence of the external cause: Secondary | ICD-10-CM | POA: Insufficient documentation

## 2021-04-29 DIAGNOSIS — Z23 Encounter for immunization: Secondary | ICD-10-CM | POA: Insufficient documentation

## 2021-04-29 DIAGNOSIS — S92422B Displaced fracture of distal phalanx of left great toe, initial encounter for open fracture: Secondary | ICD-10-CM | POA: Insufficient documentation

## 2021-04-29 DIAGNOSIS — F17210 Nicotine dependence, cigarettes, uncomplicated: Secondary | ICD-10-CM | POA: Insufficient documentation

## 2021-04-29 NOTE — ED Triage Notes (Signed)
Pt walked over to the fire department for a stubbed toe. They called [facility] to transport pt for toe pain. No other complaints.

## 2021-04-29 NOTE — ED Triage Notes (Signed)
Pt. To ED via EMS for altered mental status. Pt. Denies any ingestion of drugs or alcohol. Pt. States she dropped her son off at college. She cannot recall where she was when EMS picked her up.

## 2021-04-30 ENCOUNTER — Emergency Department: Payer: Self-pay

## 2021-04-30 ENCOUNTER — Emergency Department
Admission: EM | Admit: 2021-04-30 | Discharge: 2021-04-30 | Disposition: A | Discharge status: Home / Self Care | Payer: Self-pay | Attending: Emergency Medicine | Admitting: Emergency Medicine

## 2021-04-30 DIAGNOSIS — S91212A Laceration without foreign body of left great toe with damage to nail, initial encounter: Secondary | ICD-10-CM

## 2021-04-30 DIAGNOSIS — S92422B Displaced fracture of distal phalanx of left great toe, initial encounter for open fracture: Secondary | ICD-10-CM

## 2021-04-30 MED ORDER — HYDROCODONE-ACETAMINOPHEN 5-325 MG PO TABS: 2.0000 | ORAL_TABLET | Freq: Four times a day (QID) | ORAL | 0 refills | Status: DC | PRN

## 2021-04-30 MED ORDER — CEPHALEXIN 500 MG PO CAPS: 500.0000 mg | ORAL_CAPSULE | Freq: Four times a day (QID) | ORAL | 0 refills | Status: AC

## 2021-04-30 MED ORDER — CEFAZOLIN SODIUM-DEXTROSE 1-4 GM/50ML-% IV SOLN
1.0000 g | Freq: Once | INTRAVENOUS | Status: DC
  Filled 2021-04-30: qty 50

## 2021-04-30 MED ORDER — CEPHALEXIN 500 MG PO CAPS
500.0000 mg | ORAL_CAPSULE | Freq: Once | ORAL | Status: AC
  Administered 2021-04-30: 500 mg via ORAL
  Filled 2021-04-30: qty 1

## 2021-04-30 MED ORDER — TETANUS-DIPHTH-ACELL PERTUSSIS 5-2.5-18.5 LF-MCG/0.5 IM SUSY
0.5000 mL | PREFILLED_SYRINGE | Freq: Once | INTRAMUSCULAR | Status: AC
  Administered 2021-04-30: 0.5 mL via INTRAMUSCULAR
  Filled 2021-04-30: qty 0.5

## 2021-04-30 MED ORDER — HYDROCODONE-ACETAMINOPHEN 5-325 MG PO TABS
2.0000 | ORAL_TABLET | Freq: Once | ORAL | Status: AC
  Administered 2021-04-30: 2 via ORAL
  Filled 2021-04-30: qty 2

## 2021-04-30 NOTE — ED Notes (Signed)
Pt unable to sign for discharge d/t no signature pad available 

## 2021-04-30 NOTE — ED Notes (Signed)
Pt. Placed on BP and pulse ox monitoring. 

## 2021-04-30 NOTE — ED Provider Notes (Signed)
[facility] Emergency Department Provider Note  ____________________________________________   Event Date/Time   First MD Initiated Contact with Patient 04/30/21 [phone]     (approximate)  I have reviewed the triage vital signs and the nursing notes.   HISTORY  Chief Complaint Toe Injury    HPI Richard Mclean. is a 45 y.o. male who presents for evaluation of injury to his left great toe.  History is limited by him being a vague historian, but he reports that he was walking in flip-flops and somehow got his foot flipped upside down and dragged his toe along some concrete.  He has an open wound across the nailbed which is bleeding slowly.  I saw it after he had been soaking it in a water/Betadine solution, but he said initially it was "dirty", with heavy dirt contamination.  Pain is moderate.  Worse when he moves it.  No other injuries reported.  Unknown date of last Tdap.     Past Medical History:  Diagnosis Date   Depression     There are no problems to display for this patient.   History reviewed. No pertinent surgical history.  Prior to Admission medications   Medication Sig Start Date End Date Taking? Authorizing Provider  cephALEXin (KEFLEX) 500 MG capsule Take 1 capsule (500 mg total) by mouth 4 (four) times daily for 7 days. 04/30/21 05/07/21 Yes Loleta Rose, MD  HYDROcodone-acetaminophen (NORCO/VICODIN) 5-325 MG tablet Take 2 tablets by mouth every 6 (six) hours as needed for moderate pain or severe pain. 04/30/21  Yes Loleta Rose, MD  folic acid (FOLVITE) 1 MG tablet Take 1 tablet (1 mg total) by mouth daily. 02/13/21 02/13/22  Ward, Layla Maw, DO  multivitamin (ONE-A-DAY MEN'S) TABS tablet Take 1 tablet by mouth daily. 02/13/21   Ward, Layla Maw, DO  thiamine 100 MG tablet Take 1 tablet (100 mg total) by mouth daily. 02/13/21   Ward, Layla Maw, DO    Allergies Patient has no known allergies.  Family History  Problem Relation Age of Onset    Alcohol abuse Mother    Depression Mother    Mental illness Mother    Mental illness Father    Mental illness Sister    HIV/AIDS Brother    Mental illness Maternal Grandmother     Social History Social History   Tobacco Use   Smoking status: Every Day    Types: Cigarettes   Smokeless tobacco: Never  Vaping Use   Vaping Use: Every day  Substance Use Topics   Alcohol use: Yes    Comment: Malt liquor or liquor every other weekend   Drug use: Not Currently    Types: Marijuana    Review of Systems Constitutional: No fever/chills Respiratory: Denies shortness of breath. Gastrointestinal: No abdominal pain.  No nausea, no vomiting.   Musculoskeletal: Left great toe pain. Integumentary: Laceration to left great toe Neurological: Negative for headaches, focal weakness or numbness.   ____________________________________________   PHYSICAL EXAM:  VITAL SIGNS: ED Triage Vitals  Enc Vitals Group     BP 04/29/21 2212 99/70     Pulse Rate 04/29/21 2212 96     Resp 04/29/21 2212 18     Temp 04/29/21 2212 99.8 F (37.7 C)     Temp Source 04/29/21 2212 Oral     SpO2 04/29/21 2212 96 %     Weight 04/29/21 2154 122.5 kg (270 lb 1 oz)     Height 04/29/21 2154 1.829 m (  6')     Head Circumference --      Peak Flow --      Pain Score 04/29/21 2154 5     Pain Loc --      Pain Edu? --      Excl. in GC? --     Constitutional: Alert and oriented.  Eyes: Conjunctivae are normal.  Head: Atraumatic. Neck: No stridor.  No meningeal signs.   Cardiovascular: Normal rate, regular rhythm. Good peripheral circulation. Respiratory: Normal respiratory effort.  No retractions. Gastrointestinal: Soft and nondistended. Musculoskeletal: Swelling with some ecchymosis throughout the left great toe.  At least half of his toenail has been ripped off and there appears to be some nailbed injury but no active bleeding.  The laceration extends at roughly the midpoint of the distal phalanx of the  left great toe on the medial aspect of the toe moving away from the nail. Neurologic:  Normal speech and language. No gross focal neurologic deficits are appreciated.  Skin:  Skin is warm and dry.  Left great toe laceration as described above. Psychiatric: Mood and affect are somewhat flat but generally appropriate under the circumstances with no warning signs or symptoms such as suicidal ideation or homicidal ideation.  He is not responding to internal stimuli and does not appear intoxicated.  ____________________________________________   RADIOLOGY I, Loleta Rose, personally viewed and evaluated these images (plain radiographs) as part of my medical decision making, as well as reviewing the written report by the radiologist.  ED MD interpretation: Mildly displaced transverse left great toe distal phalanx fracture.  Official radiology report(s): DG Toe Great Left  Result Date: 04/30/2021 CLINICAL DATA:  45 year old male with history of injury to the left great toe. EXAM: LEFT GREAT TOE COMPARISON:  No priors. FINDINGS: Obliquely oriented fracture through the tuft of the first distal phalanx which is mildly displaced inferomedially approximately 3 mm. Soft tissues are highly irregular adjacent to this fracture, suggesting a deep laceration with potential open fracture. IMPRESSION: 1. Mildly displaced likely open oblique fracture of the distal phalanx of the left great toe. Electronically Signed   By: Trudie Reed M.D.   On: 04/30/2021 06:58    ____________________________________________   PROCEDURES   Procedure(s) performed (including Critical Care):  Procedures   ____________________________________________   INITIAL IMPRESSION / MDM / ASSESSMENT AND PLAN / ED COURSE  As part of my medical decision making, I reviewed the following data within the electronic MEDICAL RECORD NUMBER Old chart reviewed, Discussed with podiatrist (Dr. Lilian Kapur), Notes from prior ED visits, and [place]  Controlled Substance Database   Differential diagnosis includes, but is not limited to, simple laceration, nailbed injury, open fracture, fracture/dislocation.  Given the tenderness and swelling and ecchymotic discoloration of the left great toe, I was concerned after seeing the extent of the injury that he had a fracture.  I personally reviewed the patient's imaging and agree with the radiologist's interpretation that there is a significant transverse fracture of the distal phalanx making this technically an open fracture.  I had already given him Keflex 500 mg by mouth as well as 2 Norco and a Tdap.  I will contact podiatry to discuss the case and what additional management may be needed in terms of immobilization, prophylactic antibiotics, etc.       Clinical Course as of 04/30/21 0838  [facility] Apr 30, 2021  0718 DG Toe Great Left [CF]  [phone] Discussed case by phone with Dr. Lilian Kapur with podiatry.  He said that  allowing the toe to heal by secondary intention should be fine, and agreed with the plan for bandage, hard soled shoe shoe, and clinic follow-up on Wednesday.  He also agreed with Keflex.  He felt that Ancef was not absolutely necessary unless I felt strongly about it, but I think the Keflex should be adequate. [CF]  0745 I discussed the recommendations with the patient and he agreed to the IV antibiotics and discharged with crutches, hard soled shoe, antibiotic prescription, and pain medicine.  He knows he needs to call the office of Dr. Lilian Kapur to schedule a follow-up appointment.  I gave my usual and customary return precautions. [CF]    Clinical Course User Index [CF] Loleta Rose, MD     ____________________________________________  FINAL CLINICAL IMPRESSION(S) / ED DIAGNOSES  Final diagnoses:  Open displaced fracture of distal phalanx of left great toe, initial encounter  Laceration of left great toe without foreign body with damage to nail, initial encounter      MEDICATIONS GIVEN DURING THIS VISIT:  Medications  ceFAZolin (ANCEF) IVPB 1 g/50 mL premix (1 g Intravenous Patient Refused/Not Given 04/30/21 0818)  cephALEXin (KEFLEX) capsule 500 mg (500 mg Oral Given 04/30/21 0635)  HYDROcodone-acetaminophen (NORCO/VICODIN) 5-325 MG per tablet 2 tablet (2 tablets Oral Given 04/30/21 0634)  Tdap (BOOSTRIX) injection 0.5 mL (0.5 mLs Intramuscular Given 04/30/21 0733)     ED Discharge Orders          Ordered    HYDROcodone-acetaminophen (NORCO/VICODIN) 5-325 MG tablet  Every 6 hours PRN        04/30/21 0750    cephALEXin (KEFLEX) 500 MG capsule  4 times daily        04/30/21 0751             Note:  This document was prepared using Dragon voice recognition software and may include unintentional dictation errors.   Loleta Rose, MD 04/30/21 [phone]

## 2021-04-30 NOTE — Discharge Instructions (Addendum)
Please keep your wound clean and dry.  Weight-bear as tolerated but you may use the crutches and the hard soled shoe that we provided.  Please take the full course of antibiotics as prescribed.  Use over-the-counter ibuprofen and/or Tylenol as needed for pain relief.  Take Norco as prescribed for severe pain. Do not drink alcohol, drive or participate in any other potentially dangerous activities while taking this medication as it may make you sleepy. Do not take this medication with any other sedating medications, either prescription or over-the-counter. If you were prescribed Percocet or Vicodin, do not take these with acetaminophen (Tylenol) as it is already contained within these medications.   This medication is an opiate (or narcotic) pain medication and can be habit forming.  Use it as little as possible to achieve adequate pain control.  Do not use or use it with extreme caution if you have a history of opiate abuse or dependence.  If you are on a pain contract with your primary care doctor or a pain specialist, be sure to let them know you were prescribed this medication today from the [facility] Emergency Department.  This medication is intended for your use only - do not give any to anyone else and keep it in a secure place where nobody else, especially children, have access to it.  It will also cause or worsen constipation, so you may want to consider taking an over-the-counter stool softener while you are taking this medication.

## 2021-04-30 NOTE — ED Notes (Signed)
Pt L big toe wrapped in xeroform and gauze, large post op shoes placed on L foot

## 2021-04-30 NOTE — ED Notes (Signed)
Pt refusing IV stating that if he has any bruises on his arms he cannot donate plasma- Dr York Cerise made aware

## 2021-04-30 NOTE — ED Notes (Signed)
Rad at bedside.

## 2021-04-30 NOTE — ED Notes (Signed)
Pt's toe dressing removed, soaking toe in sterile saline/betadine mixture. Lac tray/ irrigation set up at bedside. MD aware.

## 2021-05-22 DIAGNOSIS — Z20822 Contact with and (suspected) exposure to covid-19: Secondary | ICD-10-CM | POA: Diagnosis not present

## 2021-05-25 ENCOUNTER — Other Ambulatory Visit: Payer: Self-pay

## 2021-05-25 ENCOUNTER — Emergency Department
Admission: EM | Admit: 2021-05-25 | Discharge: 2021-05-26 | Disposition: A | Discharge status: Home / Self Care | Payer: Self-pay | Attending: Emergency Medicine | Admitting: Emergency Medicine

## 2021-05-25 ENCOUNTER — Emergency Department: Payer: Self-pay

## 2021-05-25 DIAGNOSIS — F17210 Nicotine dependence, cigarettes, uncomplicated: Secondary | ICD-10-CM | POA: Insufficient documentation

## 2021-05-25 DIAGNOSIS — F10920 Alcohol use, unspecified with intoxication, uncomplicated: Secondary | ICD-10-CM

## 2021-05-25 DIAGNOSIS — F10120 Alcohol abuse with intoxication, uncomplicated: Secondary | ICD-10-CM | POA: Insufficient documentation

## 2021-05-25 DIAGNOSIS — Y908 Blood alcohol level of 240 mg/100 ml or more: Secondary | ICD-10-CM | POA: Insufficient documentation

## 2021-05-25 DIAGNOSIS — R456 Violent behavior: Secondary | ICD-10-CM | POA: Insufficient documentation

## 2021-05-25 LAB — CBG MONITORING, ED: Glucose-Capillary: 97 mg/dL (ref 70–99)

## 2021-05-25 MED ORDER — MIDAZOLAM HCL 2 MG/2ML IJ SOLN
4.0000 mg | INTRAMUSCULAR | Status: AC
  Administered 2021-05-25: 4 mg via INTRAMUSCULAR
  Filled 2021-05-25: qty 4

## 2021-05-25 NOTE — ED Triage Notes (Signed)
Pt BIBA from the side of the road. Pt yelling and aggressive on arrival to ED. Pt denies pain. Pt slurring words. Vitals stable.

## 2021-05-25 NOTE — ED Notes (Signed)
Pt is cooperative with dressing into hospital provided scrubs at this time, pt continues with obvious intoxication, stats he has drank an unknown amount of alcohol. Items collected include  Black shoes Gray socks Blue lighter Black pants Black shirt  Placed in bag 1 of 1

## 2021-05-25 NOTE — ED Provider Notes (Signed)
[facility]  ____________________________________________   Event Date/Time   First MD Initiated Contact with Patient 05/25/21 1929     (approximate)  I have reviewed the triage vital signs and the nursing notes.   HISTORY  Chief Complaint Alcohol Intoxication    HPI Richard Mclean Richard Mclean. is a 45 y.o. male past medical history of depression who presents with alcohol intoxication.  Per EMS patient was found sitting on the side of the street.  Bystanders called for altered mental status.  Patient admits to drinking about 1/5 of hard alcohol.  He denies any acute complaints at this time however history is limited by his intoxication.         Past Medical History:  Diagnosis Date   Depression     There are no problems to display for this patient.   No past surgical history on file.  Prior to Admission medications   Medication Sig Start Date End Date Taking? Authorizing Provider  folic acid (FOLVITE) 1 MG tablet Take 1 tablet (1 mg total) by mouth daily. 02/13/21 02/13/22  Ward, Layla Maw, DO  HYDROcodone-acetaminophen (NORCO/VICODIN) 5-325 MG tablet Take 2 tablets by mouth every 6 (six) hours as needed for moderate pain or severe pain. 04/30/21   Loleta Rose, MD  multivitamin (ONE-A-DAY MEN'S) TABS tablet Take 1 tablet by mouth daily. 02/13/21   Ward, Layla Maw, DO  thiamine 100 MG tablet Take 1 tablet (100 mg total) by mouth daily. 02/13/21   Ward, Layla Maw, DO    Allergies Patient has no known allergies.  Family History  Problem Relation Age of Onset   Alcohol abuse Mother    Depression Mother    Mental illness Mother    Mental illness Father    Mental illness Sister    HIV/AIDS Brother    Mental illness Maternal Grandmother     Social History Social History   Tobacco Use   Smoking status: Every Day    Types: Cigarettes   Smokeless tobacco: Never  Vaping Use   Vaping Use: Every day  Substance Use Topics   Alcohol use: Yes     Comment: Malt liquor or liquor every other weekend   Drug use: Not Currently    Types: Marijuana    Review of Systems   Review of Systems  Unable to perform ROS: Mental status change   Physical Exam Updated Vital Signs BP 135/83   Pulse 85   Temp 98.7 F (37.1 C) (Oral)   Resp 16   Ht 6' (1.829 m)   Wt 123 kg   SpO2 96%   BMI 36.78 kg/m   Physical Exam Vitals and nursing note reviewed.  Constitutional:      General: He is not in acute distress.    Appearance: Normal appearance.  HENT:     Head: Normocephalic and atraumatic.  Eyes:     General: No scleral icterus.    Conjunctiva/sclera: Conjunctivae normal.  Pulmonary:     Effort: Pulmonary effort is normal. No respiratory distress.     Breath sounds: Normal breath sounds. No wheezing.  Musculoskeletal:        General: No deformity or signs of injury.     Cervical back: Normal range of motion.  Skin:    Coloration: Skin is not jaundiced or pale.  Neurological:     Mental Status: He is alert.     Comments: Slurred speech, moves all extremities spontaneously, ataxic gait  Psychiatric:  Comments: Patient is belligerent, intermittently yelling but occasionally redirectable     LABS (all labs ordered are listed, but only abnormal results are displayed)  Labs Reviewed  CBG MONITORING, ED   ____________________________________________  EKG  N/a ____________________________________________  RADIOLOGY I, Randol Kern, personally viewed and evaluated these images (plain radiographs) as part of my medical decision making, as well as reviewing the written report by the radiologist.  ED MD interpretation: CT head pending at the time of signout    ____________________________________________   PROCEDURES  Procedure(s) performed (including Critical Care):  Procedures   ____________________________________________   INITIAL IMPRESSION / ASSESSMENT AND PLAN / ED COURSE     Patient is a  45 year old male who presents with alcohol intoxication.  He corroborates that he drank 1/5 of hard alcohol tonight.  Vital signs within normal limits.  Accu-Chek within normal limits.  Patient has no signs of trauma he is intoxicated and belligerent.  Is not physically aggressive but is yelling and is highly inappropriate.  He was given 4 of IM Versed.  We will get CT head given I suspect that   Clinical Course as of 05/25/21 2211  Thu May 25, 2021  1946 Glucose-Capillary: 57 [KM]    Clinical Course User Index [KM] Georga Hacking, MD     ____________________________________________   FINAL CLINICAL IMPRESSION(S) / ED DIAGNOSES  Final diagnoses:  Alcoholic intoxication without complication [facility])     ED Discharge Orders     None        Note:  This document was prepared using Dragon voice recognition software and may include unintentional dictation errors.    Georga Hacking, MD 05/25/21 2211

## 2021-05-25 NOTE — ED Notes (Signed)
Pt is moved to room 21 at this time by this nurse and in accompany of security via wheelchair. Pt is intoxicated, slurring speech, unsteady, pt is flirtatious with this nurse and other staff after arriving to room.

## 2021-05-25 NOTE — ED Notes (Addendum)
Patient sitting up in stretcher continuously yelling profanities at staff and other patients. Security and primary RN at bedside. Pt states " You won't do nothing, Just go on and call the law. My family the police. [place]. Go to hell. All ya'll."

## 2021-05-25 NOTE — ED Notes (Signed)
Pt transported to CT by this nurse and Officer Wiley. Pt cooperative throughout and returns to room without any issues. Pt returns to bed and finishes a bag of chips in room, light dimmed and blanket provided.

## 2021-05-25 NOTE — ED Provider Notes (Signed)
-----------------------------------------   11:08 PM on 05/25/2021 -----------------------------------------  Assuming care from Dr. Sidney Ace.  In short, Richard Mclean. is a 45 y.o. male with a chief complaint of intoxication.  Refer to the original H&P for additional details.  Noncontrast head CT is normal with no evidence of intracranial bleeding.  Will obtain an ethanol level but the patient will need to stay in a safe environment while he metabolizes what appears to be substantial alcohol intoxication.  The patient has been placed in psychiatric observation due to the need to provide a safe environment for the patient while obtaining psychiatric consultation and evaluation, as well as ongoing medical and medication management to treat the patient's condition.  The patient has not been placed under full IVC at this time.   ----------------------------------------- 7:27 AM on 05/26/2021 -----------------------------------------   Patient has been sleeping all night.  Will be evaluated by dayime EDP to determine if patient needs additional treatment or psych evaluation or if he can be discharged.    Richard Rose, MD 05/26/21 [phone]

## 2021-05-25 NOTE — ED Notes (Signed)
Pt is asleep at this time, respirations even and unlabored. Regular safety rounds will continue, any changes will be noted.

## 2021-05-26 LAB — ETHANOL: Alcohol, Ethyl (B): 272 mg/dL — ABNORMAL HIGH (ref <10)

## 2021-05-26 NOTE — ED Notes (Signed)
VOL, pending detox/discharge

## 2021-05-26 NOTE — ED Notes (Signed)
Verified correct patient and correct discharge papers given. Pt alert and oriented X 4, stable for discharge. RR even and unlabored, color WNL. Discussed discharge instructions and follow-up as directed. Discharge medications discussed, when prescribed. Pt had opportunity to ask questions, and RN available to provide patient and/or family education. Left with all of belongings. Given breakfast. Walked to bus stop. Declined discharge VS

## 2021-05-26 NOTE — ED Notes (Signed)
PATIENT RESTING, REASSESS VITALS WHEN PATIENT IS AWAKE 

## 2021-05-26 NOTE — ED Provider Notes (Signed)
-----------------------------------------   9:23 AM on 05/26/2021 ----------------------------------------- Patient is now awake and alert, appears clinically sober.  He denies any psychiatric complaints and he is not under IVC.  He is appropriate for discharge home, was counseled to return to the ED for any new or worsening symptoms.   Chesley Noon, MD 05/26/21 [phone]

## 2021-05-26 NOTE — ED Notes (Addendum)
Pt awake, alert and ambulates safely. States that he wants to leave. EDP updated.

## 2021-06-21 ENCOUNTER — Emergency Department
Admission: EM | Admit: 2021-06-21 | Discharge: 2021-06-21 | Disposition: A | Discharge status: Home / Self Care | Payer: Medicaid Other | Attending: Emergency Medicine | Admitting: Emergency Medicine

## 2021-06-21 ENCOUNTER — Emergency Department: Payer: Medicaid Other

## 2021-06-21 DIAGNOSIS — F17210 Nicotine dependence, cigarettes, uncomplicated: Secondary | ICD-10-CM | POA: Insufficient documentation

## 2021-06-21 DIAGNOSIS — F10920 Alcohol use, unspecified with intoxication, uncomplicated: Secondary | ICD-10-CM

## 2021-06-21 DIAGNOSIS — F10120 Alcohol abuse with intoxication, uncomplicated: Secondary | ICD-10-CM | POA: Insufficient documentation

## 2021-06-21 DIAGNOSIS — Y908 Blood alcohol level of 240 mg/100 ml or more: Secondary | ICD-10-CM | POA: Insufficient documentation

## 2021-06-21 DIAGNOSIS — R079 Chest pain, unspecified: Secondary | ICD-10-CM

## 2021-06-21 DIAGNOSIS — R0602 Shortness of breath: Secondary | ICD-10-CM | POA: Insufficient documentation

## 2021-06-21 LAB — CBC WITH DIFFERENTIAL/PLATELET
Abs Immature Granulocytes: 0.01 10*3/uL (ref 0.00–0.07)
Basophils Absolute: 0.1 10*3/uL (ref 0.0–0.1)
Basophils Relative: 1 %
Eosinophils Absolute: 0.1 10*3/uL (ref 0.0–0.5)
Eosinophils Relative: 1 %
HCT: 47 % (ref 39.0–52.0)
Hemoglobin: 16.9 g/dL (ref 13.0–17.0)
Immature Granulocytes: 0 %
Lymphocytes Relative: 29 %
Lymphs Abs: 2 10*3/uL (ref 0.7–4.0)
MCH: 36.1 pg — ABNORMAL HIGH (ref 26.0–34.0)
MCHC: 36 g/dL (ref 30.0–36.0)
MCV: 100.4 fL — ABNORMAL HIGH (ref 80.0–100.0)
Monocytes Absolute: 0.6 10*3/uL (ref 0.1–1.0)
Monocytes Relative: 9 %
Neutro Abs: 4.1 10*3/uL (ref 1.7–7.7)
Neutrophils Relative %: 60 %
Platelets: 308 10*3/uL (ref 150–400)
RBC: 4.68 MIL/uL (ref 4.22–5.81)
RDW: 11.9 % (ref 11.5–15.5)
WBC: 6.9 10*3/uL (ref 4.0–10.5)
nRBC: 0 % (ref 0.0–0.2)

## 2021-06-21 LAB — COMPREHENSIVE METABOLIC PANEL
ALT: 44 U/L (ref 0–44)
AST: 44 U/L — ABNORMAL HIGH (ref 15–41)
Albumin: 4.1 g/dL (ref 3.5–5.0)
Alkaline Phosphatase: 49 U/L (ref 38–126)
Anion gap: 10 (ref 5–15)
BUN: 8 mg/dL (ref 6–20)
CO2: 25 mmol/L (ref 22–32)
Calcium: 9.1 mg/dL (ref 8.9–10.3)
Chloride: 107 mmol/L (ref 98–111)
Creatinine, Ser: 1.02 mg/dL (ref 0.61–1.24)
GFR, Estimated: >60 mL/min (ref >60)
Glucose, Bld: 88 mg/dL (ref 70–99)
Potassium: 3.7 mmol/L (ref 3.5–5.1)
Sodium: 142 mmol/L (ref 135–145)
Total Bilirubin: 0.5 mg/dL (ref 0.3–1.2)
Total Protein: 7.1 g/dL (ref 6.5–8.1)

## 2021-06-21 LAB — TROPONIN I (HIGH SENSITIVITY)
Troponin I (High Sensitivity): 3 ng/L (ref <18)
Troponin I (High Sensitivity): <2 ng/L (ref <18)

## 2021-06-21 LAB — ETHANOL: Alcohol, Ethyl (B): 250 mg/dL — ABNORMAL HIGH (ref <10)

## 2021-06-21 NOTE — Discharge Instructions (Signed)
Steps to find a Primary Care Provider (PCP):  Call [phone] or [phone] to access "[facility] Find a Doctor Service."  2.  You may also go on the [facility] website at [email or web address]  

## 2021-06-21 NOTE — ED Triage Notes (Signed)
Brought in by on the side of road; patient reports chest pain tonight; reports unknown amount of alcohol

## 2021-06-21 NOTE — ED Notes (Signed)
Pt ambulated with steady gait in hallway. Pt will be discharged.

## 2021-06-21 NOTE — ED Provider Notes (Signed)
-----------------------------------------   7:20 AM on 06/21/2021 -----------------------------------------  Blood pressure 108/67, pulse 63, temperature 98.3 F (36.8 C), temperature source Oral, resp. rate 18, SpO2 96 %.  Assuming care from Dr. Elesa Massed.  In short, Richard Mclean. is a 45 y.o. male with a chief complaint of Chest Pain .  Refer to the original H&P for additional details.  The current plan of care is to reassess and discharge home once clinically sober.  ----------------------------------------- 8:51 AM on 06/21/2021 ----------------------------------------- Patient now awake and alert, tolerating p.o. without difficulty, appears clinically sober.  He states he is feeling better and denies any complaints at this time, chest pain work-up has been unremarkable.  He is appropriate for discharge home with PCP follow-up, was counseled to return to the ED for new worsening symptoms, patient agrees with plan.    Chesley Noon, MD 06/21/21 [phone]

## 2021-06-21 NOTE — ED Notes (Signed)
Pt is sleeping

## 2021-06-21 NOTE — ED Provider Notes (Signed)
[facility] Emergency Department Provider Note  ____________________________________________   Event Date/Time   First MD Initiated Contact with Patient 06/21/21 [phone]     (approximate)  I have reviewed the triage vital signs and the nursing notes.   HISTORY  Chief Complaint Chest Pain    HPI Richard Mclean. is a 45 y.o. male with history of depression, alcohol abuse who presents to the emergency department with EMS intoxicated and complaining of chest pain.  They report patient was walking down the road when he flagged down a vehicle stating that he was having chest pain that radiated into his left arm with shortness of breath.  He is unable to describe this pain any further given he is extremely intoxicated.  He states his symptoms are improving.  He is unable to tell me how much alcohol he drank tonight.  He is unable to tell me if he drinks every day.  He is unable to tell me if he has used any drugs today.        Past Medical History:  Diagnosis Date   Depression     There are no problems to display for this patient.   No past surgical history on file.  Prior to Admission medications   Medication Sig Start Date End Date Taking? Authorizing Provider  folic acid (FOLVITE) 1 MG tablet Take 1 tablet (1 mg total) by mouth daily. Patient not taking: Reported on 06/21/2021 02/13/21 02/13/22  Jacai Kipp, Layla Maw, DO  HYDROcodone-acetaminophen (NORCO/VICODIN) 5-325 MG tablet Take 2 tablets by mouth every 6 (six) hours as needed for moderate pain or severe pain. Patient not taking: Reported on 06/21/2021 04/30/21   Loleta Rose, MD  multivitamin (ONE-A-DAY MEN'S) TABS tablet Take 1 tablet by mouth daily. Patient not taking: Reported on 06/21/2021 02/13/21   Vita Currin, Layla Maw, DO  thiamine 100 MG tablet Take 1 tablet (100 mg total) by mouth daily. Patient not taking: Reported on 06/21/2021 02/13/21   Raiden Yearwood, Layla Maw, DO    Allergies Patient has no known  allergies.  Family History  Problem Relation Age of Onset   Alcohol abuse Mother    Depression Mother    Mental illness Mother    Mental illness Father    Mental illness Sister    HIV/AIDS Brother    Mental illness Maternal Grandmother     Social History Social History   Tobacco Use   Smoking status: Every Day    Types: Cigarettes   Smokeless tobacco: Never  Vaping Use   Vaping Use: Every day  Substance Use Topics   Alcohol use: Yes    Comment: Malt liquor or liquor every other weekend   Drug use: Not Currently    Types: Marijuana    Review of Systems Level 5 caveat secondary to intoxication  ____________________________________________   PHYSICAL EXAM:  VITAL SIGNS: ED Triage Vitals [06/21/21 0255]  Enc Vitals Group     BP 116/81     Pulse Rate 94     Resp 18     Temp 98.3 F (36.8 C)     Temp Source Oral     SpO2 97 %     Weight      Height      Head Circumference      Peak Flow      Pain Score      Pain Loc      Pain Edu?      Excl. in GC?  CONSTITUTIONAL: Alert and oriented and responds appropriately to some questions intermittently.  Chronically ill-appearing, extremely intoxicated.  Poor historian. HEAD: Normocephalic, atraumatic EYES: Conjunctivae clear, pupils appear equal, EOM appear intact ENT: normal nose; moist mucous membranes NECK: Supple, normal ROM, no midline step-off or deformity CARD: RRR; S1 and S2 appreciated; no murmurs, no clicks, no rubs, no gallops RESP: Normal chest excursion without splinting or tachypnea; breath sounds clear and equal bilaterally; no wheezes, no rhonchi, no rales, no hypoxia or respiratory distress, speaking full sentences ABD/GI: Normal bowel sounds; non-distended; soft, non-tender, no rebound, no guarding, no peritoneal signs, no hepatosplenomegaly BACK: The back appears normal, no midline step-off or deformity EXT: Normal ROM in all joints; no deformity noted, no edema; no cyanosis, no calf tenderness  or calf swelling SKIN: Normal color for age and race; extremities cool to touch but no cyanosis; no rash on exposed skin NEURO: Moves all extremities equally, slurred speech PSYCH: Intermittently verbally aggressive but redirectable.  Intoxicated.  ____________________________________________   LABS (all labs ordered are listed, but only abnormal results are displayed)  Labs Reviewed  CBC WITH DIFFERENTIAL/PLATELET - Abnormal; Notable for the following components:      Result Value   MCV 100.4 (*)    MCH 36.1 (*)    All other components within normal limits  COMPREHENSIVE METABOLIC PANEL - Abnormal; Notable for the following components:   AST 44 (*)    All other components within normal limits  ETHANOL - Abnormal; Notable for the following components:   Alcohol, Ethyl (B) 250 (*)    All other components within normal limits  URINE DRUG SCREEN, QUALITATIVE ([facility] ONLY)  TROPONIN I (HIGH SENSITIVITY)  TROPONIN I (HIGH SENSITIVITY)   ____________________________________________  EKG   EKG Interpretation  Date/Time:  Wednesday June 21 2021 03:01:46 EDT Ventricular Rate:  93 PR Interval:  136 QRS Duration: 90 QT Interval:  386 QTC Calculation: 479 R Axis:   78 Text Interpretation: Normal sinus rhythm Nonspecific ST abnormality Abnormal ECG No significant change since last tracing Confirmed by Rochele Raring [phone]) on 06/21/2021 3:03:23 AM        ____________________________________________  RADIOLOGY Normajean Baxter Exavior Kimmons, personally viewed and evaluated these images (plain radiographs) as part of my medical decision making, as well as reviewing the written report by the radiologist.  ED MD interpretation: Chest x-ray clear.  Official radiology report(s): DG Chest Portable 1 View  Result Date: 06/21/2021 CLINICAL DATA:  Shortness of breath and chest pain EXAM: PORTABLE CHEST 1 VIEW COMPARISON:  10/13/2020 FINDINGS: The heart size and mediastinal contours are within  normal limits. Both lungs are clear. The visualized skeletal structures are unremarkable. IMPRESSION: No active disease. Electronically Signed   By: Deatra Robinson M.D.   On: 06/21/2021 03:33    ____________________________________________   PROCEDURES  Procedure(s) performed (including Critical Care):  Procedures   ____________________________________________   INITIAL IMPRESSION / ASSESSMENT AND PLAN / ED COURSE  As part of my medical decision making, I reviewed the following data within the electronic MEDICAL RECORD NUMBER Nursing notes reviewed and incorporated, Labs reviewed , EKG interpreted , Old EKG reviewed, Old chart reviewed, Radiograph reviewed , and Notes from prior ED visits         Patient here with complaints of chest pain, shortness of breath.  EKG nonischemic.  Unclear past medical history.  Patient is extremely intoxicated.  Differential includes ACS, PE, dissection, pneumothorax, pneumonia, chest wall pain, esophagitis, gastritis, GERD.  Will obtain labs, chest x-ray.  Will  monitor closely.  Feels very cool to touch but oral temperature normal.  Covered with warm blankets.  No signs of trauma on exam.  Hemodynamically stable.  ED PROGRESS  Troponin x 2 negative.  Chest x-ray clear.  EKG nonischemic.  Patient has been resting comfortably.  Alcohol level 250.  Will p.o. challenge, ambulate and continue to monitor until clinical sobriety and likely discharge home.  At this time, I do not feel there is any life-threatening condition present. I have reviewed, interpreted and discussed all results (EKG, imaging, lab, urine as appropriate) and exam findings with patient/family. I have reviewed nursing notes and appropriate previous records.  I feel the patient is safe to be discharged home without further emergent workup and can continue workup as an outpatient as needed. Discussed usual and customary return precautions. Patient/family verbalize understanding and are comfortable  with this plan.  Outpatient follow-up has been provided as needed. All questions have been answered.  ____________________________________________   FINAL CLINICAL IMPRESSION(S) / ED DIAGNOSES  Final diagnoses:  Alcoholic intoxication without complication (HCC)  Nonspecific chest pain     ED Discharge Orders     None       *Please note:  Richard Mells. was evaluated in Emergency Department on 06/21/2021 for the symptoms described in the history of present illness. He was evaluated in the context of the global COVID-19 pandemic, which necessitated consideration that the patient might be at risk for infection with the SARS-CoV-2 virus that causes COVID-19. Institutional protocols and algorithms that pertain to the evaluation of patients at risk for COVID-19 are in a state of rapid change based on information released by regulatory bodies including the CDC and federal and state organizations. These policies and algorithms were followed during the patient's care in the ED.  Some ED evaluations and interventions may be delayed as a result of limited staffing during and the pandemic.*   Note:  This document was prepared using Dragon voice recognition software and may include unintentional dictation errors.    Reylene Stauder, Layla Maw, DO 06/21/21 [phone]

## 2021-06-21 NOTE — ED Notes (Signed)
Paper discharge consent signed. 

## 2021-06-21 NOTE — ED Notes (Signed)
Woke pt up. Provided water and crackers. Explained it is time to eat, drink and try to walk so pt can be discharged. Pt now eating crackers.

## 2021-07-10 ENCOUNTER — Emergency Department
Admission: EM | Admit: 2021-07-10 | Discharge: 2021-07-11 | Disposition: A | Discharge status: Home / Self Care | Payer: Self-pay | Attending: Emergency Medicine | Admitting: Emergency Medicine

## 2021-07-10 ENCOUNTER — Other Ambulatory Visit: Payer: Self-pay

## 2021-07-10 DIAGNOSIS — F17210 Nicotine dependence, cigarettes, uncomplicated: Secondary | ICD-10-CM | POA: Insufficient documentation

## 2021-07-10 DIAGNOSIS — Z79899 Other long term (current) drug therapy: Secondary | ICD-10-CM | POA: Insufficient documentation

## 2021-07-10 DIAGNOSIS — Y908 Blood alcohol level of 240 mg/100 ml or more: Secondary | ICD-10-CM | POA: Insufficient documentation

## 2021-07-10 DIAGNOSIS — F10920 Alcohol use, unspecified with intoxication, uncomplicated: Secondary | ICD-10-CM | POA: Insufficient documentation

## 2021-07-10 LAB — COMPREHENSIVE METABOLIC PANEL
ALT: 51 U/L — ABNORMAL HIGH (ref 0–44)
AST: 53 U/L — ABNORMAL HIGH (ref 15–41)
Albumin: 3.8 g/dL (ref 3.5–5.0)
Alkaline Phosphatase: 52 U/L (ref 38–126)
Anion gap: 7 (ref 5–15)
BUN: 6 mg/dL (ref 6–20)
CO2: 27 mmol/L (ref 22–32)
Calcium: 8.3 mg/dL — ABNORMAL LOW (ref 8.9–10.3)
Chloride: 110 mmol/L (ref 98–111)
Creatinine, Ser: 0.98 mg/dL (ref 0.61–1.24)
GFR, Estimated: >60 mL/min (ref >60)
Glucose, Bld: 96 mg/dL (ref 70–99)
Potassium: 4.1 mmol/L (ref 3.5–5.1)
Sodium: 144 mmol/L (ref 135–145)
Total Bilirubin: 0.6 mg/dL (ref 0.3–1.2)
Total Protein: 6.4 g/dL — ABNORMAL LOW (ref 6.5–8.1)

## 2021-07-10 LAB — ACETAMINOPHEN LEVEL: Acetaminophen (Tylenol), Serum: <10 ug/mL — ABNORMAL LOW (ref 10–30)

## 2021-07-10 LAB — CBC
HCT: 46.2 % (ref 39.0–52.0)
Hemoglobin: 16.3 g/dL (ref 13.0–17.0)
MCH: 35.1 pg — ABNORMAL HIGH (ref 26.0–34.0)
MCHC: 35.3 g/dL (ref 30.0–36.0)
MCV: 99.6 fL (ref 80.0–100.0)
Platelets: 202 10*3/uL (ref 150–400)
RBC: 4.64 MIL/uL (ref 4.22–5.81)
RDW: 11.5 % (ref 11.5–15.5)
WBC: 5.7 10*3/uL (ref 4.0–10.5)
nRBC: 0 % (ref 0.0–0.2)

## 2021-07-10 LAB — SALICYLATE LEVEL: Salicylate Lvl: <7 mg/dL — ABNORMAL LOW (ref 7.0–30.0)

## 2021-07-10 LAB — ETHANOL: Alcohol, Ethyl (B): 315 mg/dL (ref <10)

## 2021-07-10 MED ORDER — LORAZEPAM 2 MG PO TABS
2.0000 mg | ORAL_TABLET | Freq: Once | ORAL | Status: AC
  Administered 2021-07-10: 2 mg via ORAL
  Filled 2021-07-10: qty 1

## 2021-07-10 NOTE — ED Notes (Signed)
Patient provided snack at appropriate snack time.  Pt consumed 100% of snack provided, tolerated well w/o complaints   Trash disposted of appropriately by patient.  

## 2021-07-10 NOTE — ED Triage Notes (Signed)
Pt here to see psychiatry, pt unable to give reason to as to why he is here. Pt unable to say if he is SI or HI at this time.

## 2021-07-10 NOTE — ED Provider Notes (Signed)
[facility] Emergency Department Provider Note   ____________________________________________   Event Date/Time   First MD Initiated Contact with Patient 07/10/21 1948     (approximate)  I have reviewed the triage vital signs and the nursing notes.   HISTORY  Chief Complaint Addiction Problem  EM caveat: Patient very poor historian,  HPI Richard Mclean. is a 45 y.o. male reports that he is here for evaluation.  He mentions drinking.  He also began singing songs.  Reports "this is my [place]" and has a somewhat jubilant expression.  He neither denies or admits any sort of suicidal ideation.  Denies overdose.  He seems to indicate that "I live here" when asked where he lives, and reports that he has been drinking quite a bit tonight.   Past Medical History:  Diagnosis Date   Depression     There are no problems to display for this patient.   No past surgical history on file.  Prior to Admission medications   Medication Sig Start Date End Date Taking? Authorizing Provider  folic acid (FOLVITE) 1 MG tablet Take 1 tablet (1 mg total) by mouth daily. Patient not taking: Reported on 06/21/2021 02/13/21 02/13/22  Ward, Layla Maw, DO  HYDROcodone-acetaminophen (NORCO/VICODIN) 5-325 MG tablet Take 2 tablets by mouth every 6 (six) hours as needed for moderate pain or severe pain. Patient not taking: Reported on 06/21/2021 04/30/21   Loleta Rose, MD  multivitamin (ONE-A-DAY MEN'S) TABS tablet Take 1 tablet by mouth daily. Patient not taking: Reported on 06/21/2021 02/13/21   Ward, Layla Maw, DO  thiamine 100 MG tablet Take 1 tablet (100 mg total) by mouth daily. Patient not taking: Reported on 06/21/2021 02/13/21   Ward, Layla Maw, DO    Allergies Patient has no known allergies.  Family History  Problem Relation Age of Onset   Alcohol abuse Mother    Depression Mother    Mental illness Mother    Mental illness Father    Mental illness Sister     HIV/AIDS Brother    Mental illness Maternal Grandmother     Social History Social History   Tobacco Use   Smoking status: Every Day    Types: Cigarettes   Smokeless tobacco: Never  Vaping Use   Vaping Use: Every day  Substance Use Topics   Alcohol use: Yes    Comment: Malt liquor or liquor every other weekend   Drug use: Not Currently    Types: Marijuana    Review of Systems EM caveat: Denies being any pain.  Denies that he suffered any injury.   ____________________________________________   PHYSICAL EXAM:  VITAL SIGNS: ED Triage Vitals  Enc Vitals Group     BP 07/10/21 1931 116/80     Pulse Rate 07/10/21 1931 87     Resp 07/10/21 1931 18     Temp 07/10/21 1931 98.4 F (36.9 C)     Temp Source 07/10/21 1931 Oral     SpO2 07/10/21 1931 100 %     Weight 07/10/21 1936 271 lb 2.7 oz (123 kg)     Height --      Head Circumference --      Peak Flow --      Pain Score 07/10/21 1936 0     Pain Loc --      Pain Edu? --      Excl. in GC? --     Constitutional: Alert and oriented to himself and being in  the hospital recognize me as a doctor.  Speech is slurred, slow ataxic movements of the arms bilaterally. Eyes: Conjunctivae are slightly injected bilaterally l. Head: Atraumatic. Nose: No congestion/rhinnorhea. Mouth/Throat: Mucous membranes are moist. Neck: No stridor.  Cardiovascular: Normal rate, regular rhythm. Grossly normal heart sounds.  Good peripheral circulation. Respiratory: Normal respiratory effort.  No retractions. Lungs CTAB. Gastrointestinal: Soft and nontender. No distention. Musculoskeletal: No lower extremity tenderness nor edema. Neurologic: Slurred speech.  Somewhat elevated and jubilant mood.  Occasionally saying what could best be described as historical drinking songs.  No gross focal neurologic deficits are appreciated.  Skin:  Skin is warm, dry and intact. No rash noted. Psychiatric: Mood and affect are slightly elevated.  Appears  intoxicated, however at this time I do not have confirmation of an ethanol level.  But certainly seems to demonstrate evidence of intoxication  ____________________________________________   LABS (all labs ordered are listed, but only abnormal results are displayed)  Labs Reviewed  CBC - Abnormal; Notable for the following components:      Result Value   MCH 35.1 (*)    All other components within normal limits  COMPREHENSIVE METABOLIC PANEL - Abnormal; Notable for the following components:   Calcium 8.3 (*)    Total Protein 6.4 (*)    AST 53 (*)    ALT 51 (*)    All other components within normal limits  ETHANOL - Abnormal; Notable for the following components:   Alcohol, Ethyl (B) 315 (*)    All other components within normal limits  SALICYLATE LEVEL - Abnormal; Notable for the following components:   Salicylate Lvl <7.0 (*)    All other components within normal limits  ACETAMINOPHEN LEVEL - Abnormal; Notable for the following components:   Acetaminophen (Tylenol), Serum <10 (*)    All other components within normal limits  URINE DRUG SCREEN, QUALITATIVE ([facility] ONLY)   ____________________________________________  EKG   ____________________________________________  RADIOLOGY   check ethanol ____________________________________________   PROCEDURES  Procedure(s) performed: None  Procedures  Critical Care performed: No  ____________________________________________   INITIAL IMPRESSION / ASSESSMENT AND PLAN / ED COURSE  Pertinent labs & imaging results that were available during my care of the patient were reviewed by me and considered in my medical decision making (see chart for details).   Patient presents for evaluation, this seems to demonstrate evidence of likely ethanol intoxication based on the report of his drinking elevated mood seeing songs in the hallway.  He does not answer questions about if he is any sort of suicidal or homicidal ideation, but he  also does not indicate or say that he is suicidal.  He seems rather jubilant with elevated mood singing songs.  Plan to observe, check labs check ethanol level.  If ethanol level is elevated I think this would likely explain his symptoms with no evidence of trauma.  He does have a history of heavy alcohol use in the past.  He does not complain of overt suicidal ideation not make any statements to suggest that he would be at risk of self-harm.  He is however seemingly quite intoxicated  Normal vital signs.  Labs reviewed most notable for significant alcohol intoxication  Clinical Course as of 07/10/21 2348  Mon Jul 10, 2021  2118 Patient currently sitting up eating a sandwich without distress. [MQ]    Clinical Course User Index [MQ] Sharyn Creamer, MD   ----------------------------------------- 10:52 PM on 07/10/2021 ----------------------------------------- Patient resting, recommends he is at the hospital.  Ongoing plan of care including allowing patient to be observed for his sobering from alcohol abuse and reassessment of his mental health situation assigned to oncoming physician Dr. Dolores Frame.  Patient also recently given Ativan as restless having difficulty sleeping.  ____________________________________________   FINAL CLINICAL IMPRESSION(S) / ED DIAGNOSES  Final diagnoses:  Alcoholic intoxication without complication (HCC)        Note:  This document was prepared using Dragon voice recognition software and may include unintentional dictation errors       Sharyn Creamer, MD 07/10/21 2348

## 2021-07-11 LAB — URINE DRUG SCREEN, QUALITATIVE (ARMC ONLY)
Amphetamines, Ur Screen: NOT DETECTED
Barbiturates, Ur Screen: NOT DETECTED
Benzodiazepine, Ur Scrn: NOT DETECTED
Cannabinoid 50 Ng, Ur ~~LOC~~: POSITIVE — AB
Cocaine Metabolite,Ur ~~LOC~~: NOT DETECTED
MDMA (Ecstasy)Ur Screen: NOT DETECTED
Methadone Scn, Ur: NOT DETECTED
Opiate, Ur Screen: NOT DETECTED
Phencyclidine (PCP) Ur S: NOT DETECTED
Tricyclic, Ur Screen: NOT DETECTED

## 2021-07-11 MED ORDER — ACETAMINOPHEN 325 MG PO TABS
650.0000 mg | ORAL_TABLET | Freq: Once | ORAL | Status: AC
  Administered 2021-07-11: 650 mg via ORAL
  Filled 2021-07-11: qty 2

## 2021-07-11 NOTE — ED Notes (Signed)
VOL, Detox

## 2021-07-11 NOTE — ED Notes (Signed)
Pt states he came in with a jacket, shoes and can of tobacco. Unable to find these items in storage or at the quad area. Pt was never dressed out and had his cell phone with him the entire time he was in the ER. Pt came in intoxicated at admission. Given gray socks to put on at discharge. Ambulatory with steady gait, A&O x4 and in NAD. Escorted to lobby for discharge.

## 2021-07-11 NOTE — ED Provider Notes (Signed)
Patient is now awake alert and oriented he is sober vital signs are stable.  He wants to go home he does not want any help with his alcohol problems.  He is not suicidal or homicidal.  I will discharge him as he wishes.   Arnaldo Natal, MD 07/11/21 [phone]

## 2021-07-11 NOTE — ED Provider Notes (Signed)
Emergency Medicine Observation Re-evaluation Note  Richard Mclean. is a 45 y.o. male, seen on rounds today.  Pt initially presented to the ED for complaints of Addiction Problem Currently, the patient is resting, voices no medical complaints.  Physical Exam  BP 116/80 (BP Location: Left Arm)   Pulse 87   Temp 98.4 F (36.9 C) (Oral)   Resp 18   Wt 123 kg   SpO2 100%   BMI 36.78 kg/m  Physical Exam General: Sleeping in no acute distress Cardiac: No cyanosis Lungs: Equal rise and fall Psych: Not agitated  ED Course / MDM  EKG:   I have reviewed the labs performed to date as well as medications administered while in observation.  Recent changes in the last 24 hours include no events overnight; patient slept all night.  Plan  Current plan is for reassessment once patient is awake and sober.  Anticipate discharge home if patient is not suicidal.  [name] is not under involuntary commitment.     Irean Hong, MD 07/11/21 [phone]

## 2021-07-11 NOTE — Discharge Instructions (Addendum)
Please return for any further problems.  If you wish you can try attending alcoholics anonymous or we can try to get you into [facility] for inpatient detox beginning treatment.

## 2021-07-11 NOTE — ED Notes (Signed)
Gave breakfast tray with juice. 

## 2021-08-10 ENCOUNTER — Emergency Department: Payer: Self-pay

## 2021-08-10 ENCOUNTER — Emergency Department
Admission: EM | Admit: 2021-08-10 | Discharge: 2021-08-10 | Disposition: A | Discharge status: Home / Self Care | Payer: Self-pay | Attending: Emergency Medicine | Admitting: Emergency Medicine

## 2021-08-10 DIAGNOSIS — J9601 Acute respiratory failure with hypoxia: Secondary | ICD-10-CM

## 2021-08-10 DIAGNOSIS — F10920 Alcohol use, unspecified with intoxication, uncomplicated: Secondary | ICD-10-CM

## 2021-08-10 DIAGNOSIS — Y908 Blood alcohol level of 240 mg/100 ml or more: Secondary | ICD-10-CM | POA: Insufficient documentation

## 2021-08-10 DIAGNOSIS — R4182 Altered mental status, unspecified: Secondary | ICD-10-CM | POA: Insufficient documentation

## 2021-08-10 DIAGNOSIS — G4730 Sleep apnea, unspecified: Secondary | ICD-10-CM

## 2021-08-10 DIAGNOSIS — F17210 Nicotine dependence, cigarettes, uncomplicated: Secondary | ICD-10-CM | POA: Insufficient documentation

## 2021-08-10 DIAGNOSIS — F10129 Alcohol abuse with intoxication, unspecified: Secondary | ICD-10-CM | POA: Insufficient documentation

## 2021-08-10 LAB — CBC WITH DIFFERENTIAL/PLATELET
Abs Immature Granulocytes: 0.02 10*3/uL (ref 0.00–0.07)
Basophils Absolute: 0.1 10*3/uL (ref 0.0–0.1)
Basophils Relative: 1 %
Eosinophils Absolute: 0.1 10*3/uL (ref 0.0–0.5)
Eosinophils Relative: 1 %
HCT: 45 % (ref 39.0–52.0)
Hemoglobin: 15.3 g/dL (ref 13.0–17.0)
Immature Granulocytes: 0 %
Lymphocytes Relative: 39 %
Lymphs Abs: 2.6 10*3/uL (ref 0.7–4.0)
MCH: 33.6 pg (ref 26.0–34.0)
MCHC: 34 g/dL (ref 30.0–36.0)
MCV: 98.7 fL (ref 80.0–100.0)
Monocytes Absolute: 0.4 10*3/uL (ref 0.1–1.0)
Monocytes Relative: 6 %
Neutro Abs: 3.5 10*3/uL (ref 1.7–7.7)
Neutrophils Relative %: 53 %
Platelets: 208 10*3/uL (ref 150–400)
RBC: 4.56 MIL/uL (ref 4.22–5.81)
RDW: 11.8 % (ref 11.5–15.5)
WBC: 6.7 10*3/uL (ref 4.0–10.5)
nRBC: 0 % (ref 0.0–0.2)

## 2021-08-10 LAB — COMPREHENSIVE METABOLIC PANEL
ALT: 37 U/L (ref 0–44)
AST: 52 U/L — ABNORMAL HIGH (ref 15–41)
Albumin: 4 g/dL (ref 3.5–5.0)
Alkaline Phosphatase: 47 U/L (ref 38–126)
Anion gap: 7 (ref 5–15)
BUN: 11 mg/dL (ref 6–20)
CO2: 25 mmol/L (ref 22–32)
Calcium: 8.2 mg/dL — ABNORMAL LOW (ref 8.9–10.3)
Chloride: 112 mmol/L — ABNORMAL HIGH (ref 98–111)
Creatinine, Ser: 0.89 mg/dL (ref 0.61–1.24)
GFR, Estimated: >60 mL/min (ref >60)
Glucose, Bld: 100 mg/dL — ABNORMAL HIGH (ref 70–99)
Potassium: 3.8 mmol/L (ref 3.5–5.1)
Sodium: 144 mmol/L (ref 135–145)
Total Bilirubin: 0.7 mg/dL (ref 0.3–1.2)
Total Protein: 6.4 g/dL — ABNORMAL LOW (ref 6.5–8.1)

## 2021-08-10 LAB — ACETAMINOPHEN LEVEL: Acetaminophen (Tylenol), Serum: <10 ug/mL — ABNORMAL LOW (ref 10–30)

## 2021-08-10 LAB — ETHANOL: Alcohol, Ethyl (B): 392 mg/dL (ref <10)

## 2021-08-10 LAB — SALICYLATE LEVEL: Salicylate Lvl: <7 mg/dL — ABNORMAL LOW (ref 7.0–30.0)

## 2021-08-10 LAB — TROPONIN I (HIGH SENSITIVITY): Troponin I (High Sensitivity): 4 ng/L (ref <18)

## 2021-08-10 MED ORDER — ONDANSETRON HCL 4 MG/2ML IJ SOLN: 4.0000 mg | Freq: Once | INTRAMUSCULAR | Status: AC

## 2021-08-10 MED ORDER — ONDANSETRON HCL 4 MG/2ML IJ SOLN
INTRAMUSCULAR | Status: AC
  Administered 2021-08-10: 16:00:00 4 mg via INTRAVENOUS
  Filled 2021-08-10: qty 2

## 2021-08-10 NOTE — ED Notes (Addendum)
RN placed pt on 6l/min via [place] due to apnea while sleeping.

## 2021-08-10 NOTE — ED Triage Notes (Signed)
Pt arrived via EMS from the streets of [place]. Pt was seen laying on the ground and a concern citizen called 911. Pt is acting intoxicated with slurred words and flight of ideas.
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
## 2021-08-10 NOTE — ED Notes (Signed)
RN found pt ambulating in the hall without assistance with a steady gait. Pt is A/Ox4, NAD and has mental capacity. Pt was shown the lobby to wait for a ride to come and pick him up.

## 2021-08-10 NOTE — ED Notes (Signed)
ETOH level critical of 392 notified to provider

## 2021-08-10 NOTE — Discharge Instructions (Signed)
Your oxygen was quite low while you are metabolizing her alcohol use today.  I suspect you have undiagnosed sleep apnea and I highly recommend you follow-up with a primary care doctor to have this further evaluated.
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
## 2021-08-28 LAB — BLOOD GAS, VENOUS
Acid-Base Excess: 1.7 mmol/L (ref 0.0–2.0)
Bicarbonate: 28.2 mmol/L — ABNORMAL HIGH (ref 20.0–28.0)
O2 Saturation: 52 %
Patient temperature: 37
pCO2, Ven: 50 mmHg (ref 44.0–60.0)
pH, Ven: 7.36 (ref 7.250–7.430)

## 2021-09-01 ENCOUNTER — Other Ambulatory Visit: Payer: Self-pay

## 2021-09-01 ENCOUNTER — Emergency Department
Admission: EM | Admit: 2021-09-01 | Discharge: 2021-09-02 | Disposition: A | Discharge status: Home / Self Care | Payer: Self-pay | Attending: Emergency Medicine | Admitting: Emergency Medicine

## 2021-09-01 ENCOUNTER — Emergency Department: Payer: Self-pay

## 2021-09-01 DIAGNOSIS — F10920 Alcohol use, unspecified with intoxication, uncomplicated: Secondary | ICD-10-CM

## 2021-09-01 DIAGNOSIS — F17210 Nicotine dependence, cigarettes, uncomplicated: Secondary | ICD-10-CM | POA: Insufficient documentation

## 2021-09-01 DIAGNOSIS — F10129 Alcohol abuse with intoxication, unspecified: Secondary | ICD-10-CM | POA: Insufficient documentation
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
## 2021-09-01 NOTE — ED Notes (Signed)
Pt contact not answering. Pt unable to give number for a ride. Pt unable to be DC due to intoxication

## 2021-09-01 NOTE — ED Notes (Signed)
Pt in hall yelling " you know whats better than a blonde nurse? A brunette nurse, but shes married so I guess I will just take her in the bathroom and get me a sweet piece. Pt told multiple times to lay down and be quiet.
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
## 2021-09-02 NOTE — ED Notes (Signed)
No [place] available at this time. Pt signed hardcopy.

## 2021-09-02 NOTE — ED Notes (Signed)
This RN at bedside to wake pt up. Pt is ambulatory with a steady down the hall and to the bathroom. No assistance needed. When asked if he had someone to pick him up pt states he will take the bus. Pt does not have a vehicle on the premises. Pt is A&OX4 and NAD. VSS

## 2021-09-02 NOTE — ED Notes (Signed)
Attempted to call pt's emergency contact, Tillie Horner, pt's grandmother. No answer at this time. Given HIPAA compliant voicemail.

## 2021-09-02 NOTE — ED Notes (Signed)
Pt resting with eyes closed.

## 2021-10-10 NOTE — Congregational Nurse Program (Signed)
°  Dept: [phone]   Congregational Nurse Program Note  Date of Encounter: 10/10/2021 Client to clinic for support and nail care. No other concerns/.needs at this time. Support given  Past Medical History: Past Medical History:  Diagnosis Date   Depression     Encounter Details:  CNP Questionnaire - 10/10/21 0900       Questionnaire   Do you give verbal consent to treat you today? Yes    Location Patient Served  [facility]    Visit Setting Church or Organization    Patient Status Homeless    Insurance Uninsured ([employer]/Self-Pay)    Insurance Referral N/A    Medication N/A    Medical Provider No    Screening Referrals N/A    Medical Referral N/A    Medical Appointment Made N/A    Food Have Food Insecurities   client is homeless, does get food from various sources, [place] food stamps   Transportation N/A   cleint uses the [employer] bus   Housing/Utilities No permanent housing    Interpersonal Safety Do not feel safe at current residence   [place]   Intervention Support    ED Visit Averted N/A    Life-Saving Intervention Made N/A

## 2021-12-30 ENCOUNTER — Emergency Department: Payer: Self-pay

## 2021-12-30 ENCOUNTER — Emergency Department
Admission: EM | Admit: 2021-12-30 | Discharge: 2021-12-30 | Disposition: A | Discharge status: Home / Self Care | Payer: Self-pay | Attending: Emergency Medicine | Admitting: Emergency Medicine

## 2021-12-30 ENCOUNTER — Other Ambulatory Visit: Payer: Self-pay

## 2021-12-30 DIAGNOSIS — S0081XA Abrasion of other part of head, initial encounter: Secondary | ICD-10-CM | POA: Insufficient documentation

## 2021-12-30 DIAGNOSIS — S62643A Nondisplaced fracture of proximal phalanx of left middle finger, initial encounter for closed fracture: Secondary | ICD-10-CM | POA: Insufficient documentation

## 2021-12-30 DIAGNOSIS — M79642 Pain in left hand: Secondary | ICD-10-CM | POA: Insufficient documentation

## 2021-12-30 DIAGNOSIS — W19XXXA Unspecified fall, initial encounter: Secondary | ICD-10-CM | POA: Insufficient documentation

## 2021-12-30 DIAGNOSIS — S62647A Nondisplaced fracture of proximal phalanx of left little finger, initial encounter for closed fracture: Secondary | ICD-10-CM | POA: Insufficient documentation

## 2021-12-30 DIAGNOSIS — S62641A Nondisplaced fracture of proximal phalanx of left index finger, initial encounter for closed fracture: Secondary | ICD-10-CM | POA: Insufficient documentation

## 2021-12-30 DIAGNOSIS — S62645A Nondisplaced fracture of proximal phalanx of left ring finger, initial encounter for closed fracture: Secondary | ICD-10-CM | POA: Insufficient documentation

## 2021-12-30 NOTE — ED Notes (Signed)
RN placed splint on pt left arm. Pt has PMS prior and after application of splint. Provider at bedside to approve. ?

## 2021-12-30 NOTE — ED Triage Notes (Signed)
Pt states he suffered a mechanical fall yesterday after stepping in a pot hole. Pt reports left hand pain and facial pain from the fall. Denies any LOC.  ?

## 2021-12-30 NOTE — ED Notes (Signed)
See triage note. Significant edema to left hand noted with ecchymosis to palmar surface. Pt claims significant decrease in RoM of left hand. Pulses intact, cap refill <3 sec.  ?
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
## 2021-12-30 NOTE — Discharge Instructions (Signed)
Please make follow-up appointment with Dr. Martha Clan ?You can take Tylenol for pain. ?

## 2022-01-06 ENCOUNTER — Other Ambulatory Visit: Payer: Self-pay

## 2022-01-06 ENCOUNTER — Encounter: Payer: Self-pay | Admitting: Emergency Medicine

## 2022-01-06 ENCOUNTER — Emergency Department
Admission: EM | Admit: 2022-01-06 | Discharge: 2022-01-06 | Disposition: A | Discharge status: Home / Self Care | Payer: Self-pay | Attending: Emergency Medicine | Admitting: Emergency Medicine

## 2022-01-06 DIAGNOSIS — Z5900 Homelessness unspecified: Secondary | ICD-10-CM | POA: Insufficient documentation

## 2022-01-06 DIAGNOSIS — S62617S Displaced fracture of proximal phalanx of left little finger, sequela: Secondary | ICD-10-CM | POA: Insufficient documentation

## 2022-01-06 DIAGNOSIS — W1839XA Other fall on same level, initial encounter: Secondary | ICD-10-CM | POA: Insufficient documentation

## 2022-01-06 DIAGNOSIS — S6292XS Unspecified fracture of left wrist and hand, sequela: Secondary | ICD-10-CM

## 2022-01-06 DIAGNOSIS — Y92039 Unspecified place in apartment as the place of occurrence of the external cause: Secondary | ICD-10-CM | POA: Insufficient documentation

## 2022-01-06 MED ORDER — NAPROXEN 500 MG PO TABS
500.0000 mg | ORAL_TABLET | Freq: Once | ORAL | Status: AC
  Administered 2022-01-06: 500 mg via ORAL
  Filled 2022-01-06: qty 1

## 2022-01-06 MED ORDER — NAPROXEN 500 MG PO TABS: 500.0000 mg | ORAL_TABLET | Freq: Two times a day (BID) | ORAL | 2 refills | Status: AC

## 2022-01-06 NOTE — ED Notes (Signed)
See triage note. Pt resting in bed with blanket. L hand does appear swollen compared to R. Pt rates pain as 10/10. ?

## 2022-01-06 NOTE — ED Notes (Signed)
Patient unable to sign for discharge due to splint on writing hand. Patient verbalized understanding with no questions for RN. ?

## 2022-01-06 NOTE — ED Triage Notes (Signed)
Pt to ED via POV, pt states that on 4/7 he fell and broke his hand. Pt was seen in ED on 4/8. Pt states that he is still having a lot of pain in his hand. Pt states that he had a follow up appt with ortho but he was not able to go to his appt.  ?

## 2022-01-06 NOTE — ED Provider Notes (Signed)
? ?[facility] ?Provider Note ? ? ? Event Date/Time  ? First MD Initiated Contact with Patient 01/06/22 1407   ?  (approximate) ? ? ?History  ? ?Hand Injury ? ? ?HPI ? ?Richard Mclean. is a 46 y.o. male patient follow-up for hand fracture which occurred on 12/29/2021.  Patient was seen in the emergency room at this time apartment on 12/30/2021.  Patient was splinted and advised to follow-up with [facility].  Patient did not keep appointment.  Patient stated clinic became dirty and useless and he removed it 2 days ago.  Denies loss of sensation or loss of function of the left nondominant hand.  Rates pain as a 10/10.  Patient state he is homeless condition interfere with him keeping appointments.  Requests Ortho surgeon come and evaluate him in the emergency room.  Patient is very adamant about seeing orthopedics today.  Discussed rationale for not calling orthopedic surgeon.  Patient is amenable to be resplinted. ?  ? ? ?Physical Exam  ? ?Triage Vital Signs: ?ED Triage Vitals  ?Enc Vitals Group  ?   BP 01/06/22 1355 121/84  ?   Pulse Rate 01/06/22 1355 91  ?   Resp 01/06/22 1355 16  ?   Temp 01/06/22 1355 98.4 ?F (36.9 ?C)  ?   Temp src --   ?   SpO2 01/06/22 1355 94 %  ?   Weight 01/06/22 1354 260 lb 2.3 oz (118 kg)  ?   Height 01/06/22 1354 5\' 9"  (1.753 m)  ?   Head Circumference --   ?   Peak Flow --   ?   Pain Score 01/06/22 1354 10  ?   Pain Loc --   ?   Pain Edu? --   ?   Excl. in GC? --   ? ? ?Most recent vital signs: ?Vitals:  ? 01/06/22 1355  ?BP: 121/84  ?Pulse: 91  ?Resp: 16  ?Temp: 98.4 ?F (36.9 ?C)  ?SpO2: 94%  ? ?General: Awake, no distress.  ?CV:  Good peripheral perfusion.  ?Resp:  Normal effort.  ?Abd:  No distention.  ?Other:  Fracture through the base of the second through the fifth proximal phalanges.  Per x-ray report. ? ? ?ED Results / Procedures / Treatments  ? ?Patient is resplinted in emergency room. ? ? ?PROCEDURES: ? ?Critical Care performed:  No ? ?Procedures ? ? ?MEDICATIONS ORDERED IN ED: ?Medications  ?naproxen (NAPROSYN) tablet 500 mg (500 mg Oral Given 01/06/22 1502)  ? ? ? ?IMPRESSION / MDM / ASSESSMENT AND PLAN / ED COURSE  ?I reviewed the triage vital signs and the nursing notes. ?             ?               ? ?Differential diagnosis includes, but is not limited to, fracture,sub luxation, nerve injury. ? ? ? ?FINAL CLINICAL IMPRESSION(S) / ED DIAGNOSES  ? ?Final diagnoses:  ?Left hand fracture, sequela  ? ? ? ?Rx / DC Orders  ? ?ED Discharge Orders   ? ?      Ordered  ?  naproxen (NAPROSYN) 500 MG tablet  2 times daily with meals       ? 01/06/22 1458  ? ?  ?  ? ?  ? ? ? ?Note:  This document was prepared using Dragon voice recognition software and may include unintentional dictation errors. ? ?  ?01/08/22, PA-C ?01/06/22 1503 ? ?  ?  Sharyn Creamer, MD ?01/06/22 1952 ? ?

## 2022-01-06 NOTE — Discharge Instructions (Addendum)
Wear splint and follow-up with orthopedic as directed he is discharge care instructions. ?

## 2022-01-25 IMAGING — DX DG CHEST 1V PORT
1 series · 1 of 1 positions shown · non-contrast
Comparison: 10/13/2020

CLINICAL DATA: Shortness of breath and chest pain

EXAM:
PORTABLE CHEST 1 VIEW

[chest ap]
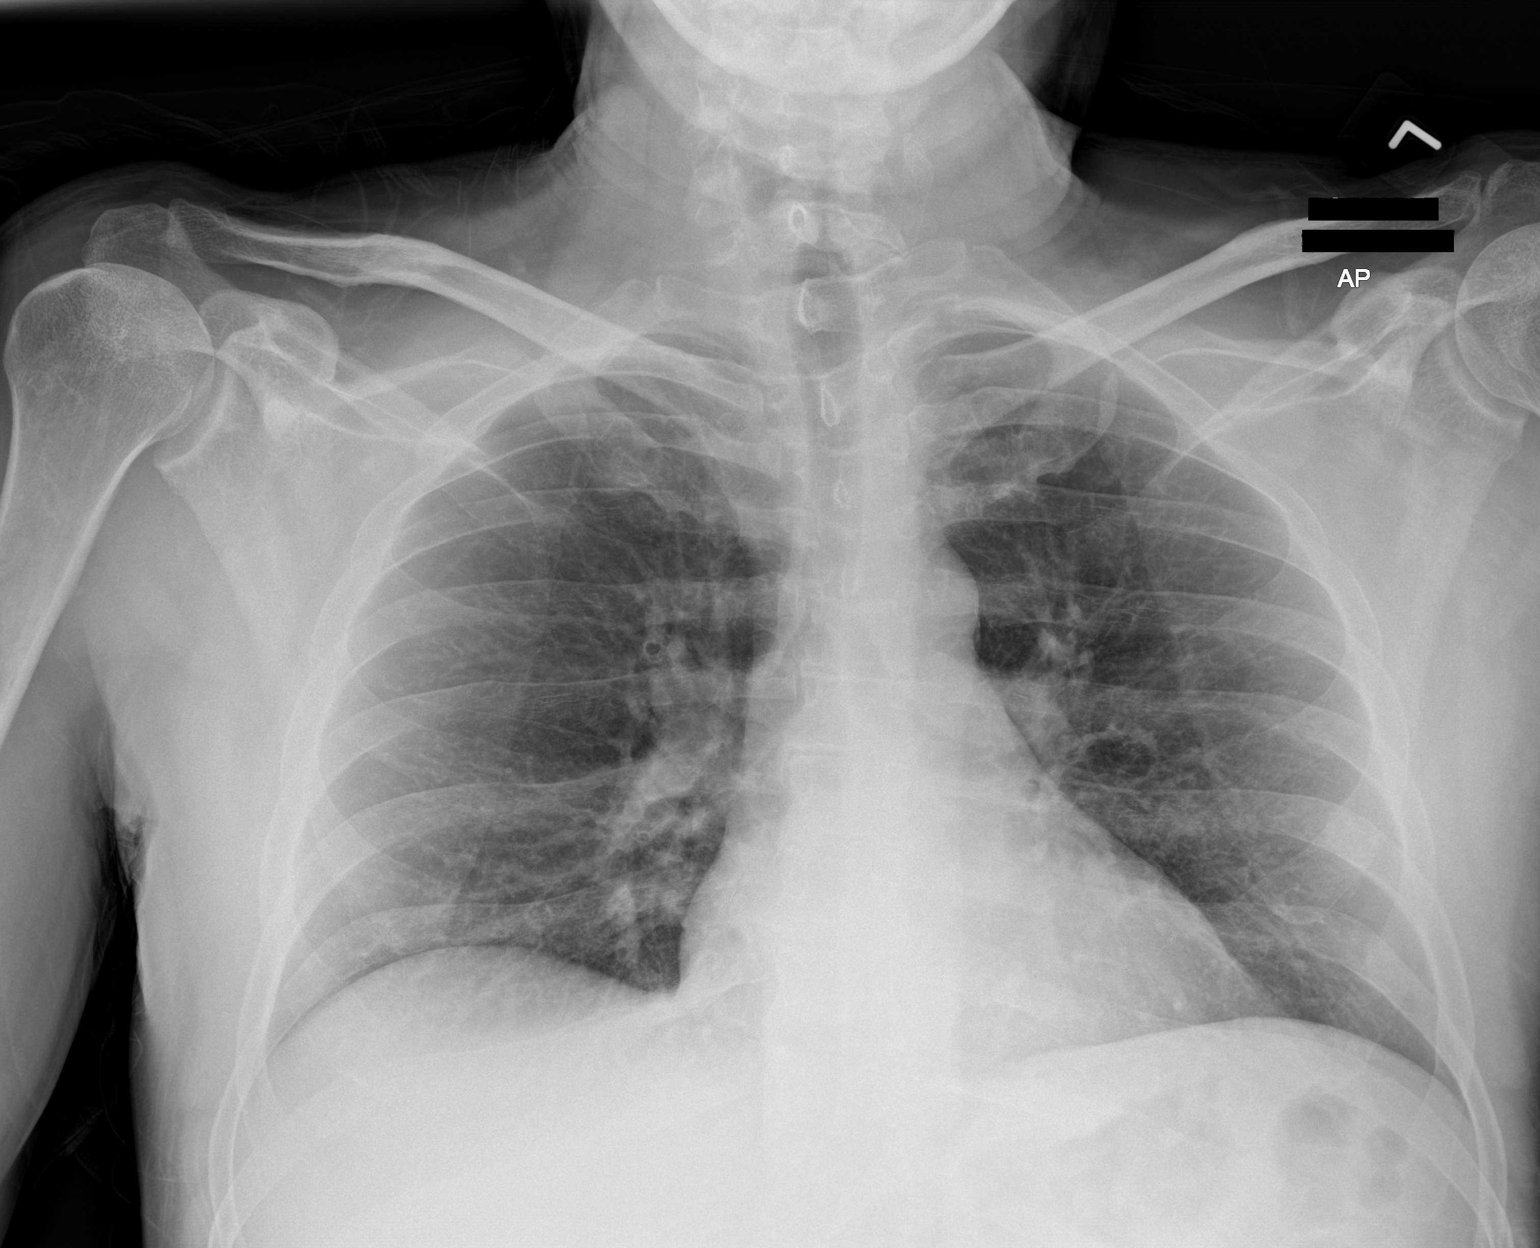

[1 of 1 positions shown; findings below may reference images not displayed]

FINDINGS: The heart size and mediastinal contours are within normal limits.
Both lungs are clear. The visualized skeletal structures are
unremarkable.
IMPRESSION: No active disease.

## 2022-02-26 ENCOUNTER — Emergency Department
Admission: EM | Admit: 2022-02-26 | Discharge: 2022-02-26 | Discharge status: Against Medical Advice | Payer: Self-pay | Attending: Emergency Medicine | Admitting: Emergency Medicine

## 2022-02-26 ENCOUNTER — Emergency Department: Payer: Self-pay

## 2022-02-26 ENCOUNTER — Other Ambulatory Visit: Payer: Self-pay

## 2022-02-26 DIAGNOSIS — X58XXXA Exposure to other specified factors, initial encounter: Secondary | ICD-10-CM | POA: Insufficient documentation

## 2022-02-26 DIAGNOSIS — S65502A Unspecified injury of blood vessel of right middle finger, initial encounter: Secondary | ICD-10-CM | POA: Insufficient documentation

## 2022-02-26 DIAGNOSIS — Z5321 Procedure and treatment not carried out due to patient leaving prior to being seen by health care provider: Secondary | ICD-10-CM | POA: Insufficient documentation

## 2022-02-26 NOTE — ED Triage Notes (Signed)
Pt comes with c/o right finger injury, pt is obviously drunk. Pt speaking in word salad. Pt not making sense. Pt loud and yelling.  Pt states he got drunk and hurt his right middle finger.

## 2022-02-26 NOTE — ED Notes (Signed)
No answer when called several times from lobby 

## 2022-02-27 NOTE — ED Notes (Signed)
WRITER INFORMED THAT PT RELATIONS WENT OUTSIDE WR TO FIND PT TO ROOM IN FLEX AREA BUT SEEN PT WALKING AWAY TOWARDS PARKING LOT. PT RELATIONS INFORMED BY BYSTANDERS STANDING OUTSIDE OF WR THAT PT STATED HE WAS GOING TO GET HIS GUN AND COME BACK. WRITER CALLED SECURITY AND REQUESTED [employer] AS WELL AS LOCK DOWN. WRITER ALSO CONTACTED PTS EMERGENCY CONTACT IN WHICH WAS GRANDMOTHER WHO STATED THAT PT WAS HOMELESS AND THAT SHE WAS NOT IN CONTACT WITH HIM ON REGULAR BASIS AND THAT PT DID NOT HAVE A CELL PHONE.

## 2022-02-28 ENCOUNTER — Encounter: Payer: Self-pay | Admitting: Emergency Medicine

## 2022-02-28 ENCOUNTER — Emergency Department
Admission: EM | Admit: 2022-02-28 | Discharge: 2022-02-28 | Disposition: A | Discharge status: Home / Self Care | Payer: Self-pay | Attending: Emergency Medicine | Admitting: Emergency Medicine

## 2022-02-28 ENCOUNTER — Emergency Department: Payer: Self-pay

## 2022-02-28 DIAGNOSIS — Y92149 Unspecified place in prison as the place of occurrence of the external cause: Secondary | ICD-10-CM | POA: Insufficient documentation

## 2022-02-28 DIAGNOSIS — Y909 Presence of alcohol in blood, level not specified: Secondary | ICD-10-CM | POA: Insufficient documentation

## 2022-02-28 DIAGNOSIS — F10129 Alcohol abuse with intoxication, unspecified: Secondary | ICD-10-CM | POA: Insufficient documentation

## 2022-02-28 DIAGNOSIS — S0081XA Abrasion of other part of head, initial encounter: Secondary | ICD-10-CM | POA: Insufficient documentation

## 2022-02-28 DIAGNOSIS — F10929 Alcohol use, unspecified with intoxication, unspecified: Secondary | ICD-10-CM

## 2022-02-28 DIAGNOSIS — W19XXXA Unspecified fall, initial encounter: Secondary | ICD-10-CM

## 2022-02-28 DIAGNOSIS — W01198A Fall on same level from slipping, tripping and stumbling with subsequent striking against other object, initial encounter: Secondary | ICD-10-CM | POA: Insufficient documentation

## 2022-02-28 DIAGNOSIS — S0031XA Abrasion of nose, initial encounter: Secondary | ICD-10-CM | POA: Insufficient documentation

## 2022-02-28 DIAGNOSIS — R519 Headache, unspecified: Secondary | ICD-10-CM | POA: Insufficient documentation

## 2022-02-28 MED ORDER — ACETAMINOPHEN 500 MG PO TABS
1000.0000 mg | ORAL_TABLET | Freq: Once | ORAL | Status: AC
Start: 2022-02-28 — End: 2022-02-28
  Administered 2022-02-28: 1000 mg via ORAL
  Filled 2022-02-28: qty 2

## 2022-02-28 NOTE — ED Notes (Signed)
Patient discharged to home per MD order. Patient in stable condition, and deemed medically cleared by ED provider for discharge. Discharge instructions reviewed with patient/family using "Teach Back"; verbalized understanding of medication education and administration, and information about follow-up care. Denies further concerns. Pt discharged to police custody.

## 2022-02-28 NOTE — ED Triage Notes (Addendum)
Pt presents in [facility] custody after being arrested for intoxication and disturbing the peace - he was brought in for medical clearance due to fall while in the holding cell. Pt has a abrasions to his forehead and bridge of his nose. Denies LOC - not on thinners.

## 2022-02-28 NOTE — Discharge Instructions (Signed)

## 2022-02-28 NOTE — ED Provider Notes (Signed)
[facility] Provider Note    Event Date/Time   First MD Initiated Contact with Patient 02/28/22 [phone]     (approximate)   History   Medical Clearance   HPI  Richard Vollbrecht. is a 46 y.o. male history of alcohol abuse who presents from jail for fall and face trauma.  Patient was arrested tonight for trespassing being under the influence.  Patient was taken to jail.  While being holding the cell patient had a fall.  Patient hit his face on the floor.  He is complaining of mild pain in his nose and forehead.  Tetanus vaccination is up-to-date.  Denies headache, neck pain, chest pain, extremity pain, abdominal pain, back pain.     Past Medical History:  Diagnosis Date   Depression     History reviewed. No pertinent surgical history.   Physical Exam   Triage Vital Signs: ED Triage Vitals  Enc Vitals Group     BP 02/28/22 0234 121/77     Pulse Rate 02/28/22 0234 83     Resp 02/28/22 0234 18     Temp 02/28/22 0234 98.6 F (37 C)     Temp Source 02/28/22 0234 Oral     SpO2 02/28/22 0234 92 %     Weight 02/28/22 0240 268 lb 15.4 oz (122 kg)     Height 02/28/22 0240 5\' 9"  (1.753 m)     Head Circumference --      Peak Flow --      Pain Score 02/28/22 0233 10     Pain Loc --      Pain Edu? --      Excl. in GC? --     Most recent vital signs: Vitals:   02/28/22 0234  BP: 121/77  Pulse: 83  Resp: 18  Temp: 98.6 F (37 C)  SpO2: 92%    Full spinal precautions maintained throughout the trauma exam. Constitutional: Alert and oriented. No acute distress.  Patient is slightly intoxicated HEENT Head: Normocephalic and atraumatic. Face: No facial bony tenderness. Stable midface.  Bruising to the bridge of the nose and forehead Ears: No hemotympanum bilaterally. No Battle sign Eyes: No eye injury. PERRL. No raccoon eyes Nose: Nontender. No epistaxis. No rhinorrhea Mouth/Throat: Mucous membranes are moist. No oropharyngeal blood. No dental  injury. Airway patent without stridor. Normal voice. Neck: no C-collar. No midline c-spine tenderness.  Cardiovascular: Normal rate, regular rhythm. Normal and symmetric distal pulses are present in all extremities. Pulmonary/Chest: Chest wall is stable and nontender to palpation/compression. Normal respiratory effort. Breath sounds are normal. No crepitus.  Abdominal: Soft, nontender, non distended. Musculoskeletal: Nontender with normal full range of motion in all extremities. No deformities. No thoracic or lumbar midline spinal tenderness. Pelvis is stable. Skin: Skin is warm, dry and intact. No abrasions or contutions. Psychiatric: Speech and behavior are appropriate. Neurological: Normal speech and language. Moves all extremities to command. No gross focal neurologic deficits are appreciated.  Glascow Coma Score: 4 - Opens eyes on own 6 - Follows simple motor commands 5 - Alert and oriented GCS: 15   ED Results / Procedures / Treatments   Labs (all labs ordered are listed, but only abnormal results are displayed) Labs Reviewed - No data to display   EKG  none   RADIOLOGY I, Nita Sickle, attending MD, have personally viewed and interpreted the images obtained during this visit as below:  CT head, C-spine, and face without any fractures or bleeding.  ___________________________________________________ Interpretation by Radiologist:  CT HEAD [place] (5MM)  Result Date: 02/28/2022 CLINICAL DATA:  Recent fall with headaches and neck pain, initial encounter EXAM: CT HEAD WITHOUT CONTRAST CT MAXILLOFACIAL WITHOUT CONTRAST CT CERVICAL SPINE WITHOUT CONTRAST TECHNIQUE: Multidetector CT imaging of the head, cervical spine, and maxillofacial structures were performed using the standard protocol without intravenous contrast. Multiplanar CT image reconstructions of the cervical spine and maxillofacial structures were also generated. RADIATION DOSE REDUCTION: This exam was  performed according to the departmental dose-optimization program which includes automated exposure control, adjustment of the mA and/or kV according to patient size and/or use of iterative reconstruction technique. COMPARISON:  12/30/2021 FINDINGS: CT HEAD FINDINGS Brain: No evidence of acute infarction, hemorrhage, hydrocephalus, extra-axial collection or mass lesion/mass effect. Vascular: No hyperdense vessel or unexpected calcification. Skull: Normal. Negative for fracture or focal lesion. Other: None. CT MAXILLOFACIAL FINDINGS Osseous: Stable old right nasal bone fracture is again seen. No acute fracture is noted. Multiple dental caries are noted. No periapical abscess is seen at this time. Orbits: Orbits and their contents are within normal limits. Sinuses: Paranasal sinuses are within normal limits. Soft tissues: Surrounding soft tissue structures are unremarkable. CT CERVICAL SPINE FINDINGS Alignment: Normal. Skull base and vertebrae: 7 cervical segments are well visualized. Very mild osteophytic changes are noted. No acute fracture or acute facet abnormality is noted. Mild facet hypertrophic changes are seen as well. Soft tissues and spinal canal: Surrounding soft tissue structures are within normal limits. Upper chest: Visualized lung apices are unremarkable. Other: None IMPRESSION: CT of the head: No acute intracranial abnormality noted. CT of the maxillofacial bones: No acute bony abnormality is noted. Old nasal bone fracture is seen on the right. Multiple dental caries. CT of the cervical spine: Mild degenerative change without acute abnormality. Electronically Signed   By: Inez Catalina M.D.   On: 02/28/2022 03:59   CT Cervical Spine Wo Contrast  Result Date: 02/28/2022 CLINICAL DATA:  Recent fall with headaches and neck pain, initial encounter EXAM: CT HEAD WITHOUT CONTRAST CT MAXILLOFACIAL WITHOUT CONTRAST CT CERVICAL SPINE WITHOUT CONTRAST TECHNIQUE: Multidetector CT imaging of the head, cervical  spine, and maxillofacial structures were performed using the standard protocol without intravenous contrast. Multiplanar CT image reconstructions of the cervical spine and maxillofacial structures were also generated. RADIATION DOSE REDUCTION: This exam was performed according to the departmental dose-optimization program which includes automated exposure control, adjustment of the mA and/or kV according to patient size and/or use of iterative reconstruction technique. COMPARISON:  12/30/2021 FINDINGS: CT HEAD FINDINGS Brain: No evidence of acute infarction, hemorrhage, hydrocephalus, extra-axial collection or mass lesion/mass effect. Vascular: No hyperdense vessel or unexpected calcification. Skull: Normal. Negative for fracture or focal lesion. Other: None. CT MAXILLOFACIAL FINDINGS Osseous: Stable old right nasal bone fracture is again seen. No acute fracture is noted. Multiple dental caries are noted. No periapical abscess is seen at this time. Orbits: Orbits and their contents are within normal limits. Sinuses: Paranasal sinuses are within normal limits. Soft tissues: Surrounding soft tissue structures are unremarkable. CT CERVICAL SPINE FINDINGS Alignment: Normal. Skull base and vertebrae: 7 cervical segments are well visualized. Very mild osteophytic changes are noted. No acute fracture or acute facet abnormality is noted. Mild facet hypertrophic changes are seen as well. Soft tissues and spinal canal: Surrounding soft tissue structures are within normal limits. Upper chest: Visualized lung apices are unremarkable. Other: None IMPRESSION: CT of the head: No acute intracranial abnormality noted. CT of the maxillofacial bones: No acute  bony abnormality is noted. Old nasal bone fracture is seen on the right. Multiple dental caries. CT of the cervical spine: Mild degenerative change without acute abnormality. Electronically Signed   By: Inez Catalina M.D.   On: 02/28/2022 03:59   CT Maxillofacial Wo  Contrast  Result Date: 02/28/2022 CLINICAL DATA:  Recent fall with headaches and neck pain, initial encounter EXAM: CT HEAD WITHOUT CONTRAST CT MAXILLOFACIAL WITHOUT CONTRAST CT CERVICAL SPINE WITHOUT CONTRAST TECHNIQUE: Multidetector CT imaging of the head, cervical spine, and maxillofacial structures were performed using the standard protocol without intravenous contrast. Multiplanar CT image reconstructions of the cervical spine and maxillofacial structures were also generated. RADIATION DOSE REDUCTION: This exam was performed according to the departmental dose-optimization program which includes automated exposure control, adjustment of the mA and/or kV according to patient size and/or use of iterative reconstruction technique. COMPARISON:  12/30/2021 FINDINGS: CT HEAD FINDINGS Brain: No evidence of acute infarction, hemorrhage, hydrocephalus, extra-axial collection or mass lesion/mass effect. Vascular: No hyperdense vessel or unexpected calcification. Skull: Normal. Negative for fracture or focal lesion. Other: None. CT MAXILLOFACIAL FINDINGS Osseous: Stable old right nasal bone fracture is again seen. No acute fracture is noted. Multiple dental caries are noted. No periapical abscess is seen at this time. Orbits: Orbits and their contents are within normal limits. Sinuses: Paranasal sinuses are within normal limits. Soft tissues: Surrounding soft tissue structures are unremarkable. CT CERVICAL SPINE FINDINGS Alignment: Normal. Skull base and vertebrae: 7 cervical segments are well visualized. Very mild osteophytic changes are noted. No acute fracture or acute facet abnormality is noted. Mild facet hypertrophic changes are seen as well. Soft tissues and spinal canal: Surrounding soft tissue structures are within normal limits. Upper chest: Visualized lung apices are unremarkable. Other: None IMPRESSION: CT of the head: No acute intracranial abnormality noted. CT of the maxillofacial bones: No acute bony  abnormality is noted. Old nasal bone fracture is seen on the right. Multiple dental caries. CT of the cervical spine: Mild degenerative change without acute abnormality. Electronically Signed   By: Inez Catalina M.D.   On: 02/28/2022 03:59       PROCEDURES:  Critical Care performed: No  Procedures    IMPRESSION / MDM / ASSESSMENT AND PLAN / ED COURSE  I reviewed the triage vital signs and the nursing notes.  46 y.o. male history of alcohol abuse who presents from jail for fall and face trauma.  Patient slightly intoxicated but alert and oriented, with normal vital signs, ambulating without any difficulties.  He has abrasions to the forehead and nose bridge.  CT head, C-spine, and face with no acute traumatic injuries.  Tetanus shot is up-to-date.  Patient given Tylenol for pain.  Patient is medically clear and will be discharged under custody to the police officer with him.  Discussed my standard return precautions and follow-up.   MEDICATIONS GIVEN IN ED: Medications  acetaminophen (TYLENOL) tablet 1,000 mg (has no administration in time range)    EMR reviewed none    FINAL CLINICAL IMPRESSION(S) / ED DIAGNOSES   Final diagnoses:  Alcoholic intoxication with complication ([place])  Fall, initial encounter  Abrasion of face, initial encounter     Rx / DC Orders   ED Discharge Orders     None        Note:  This document was prepared using Dragon voice recognition software and may include unintentional dictation errors.   Please note:  Patient was evaluated in Emergency Department today for the symptoms described  in the history of present illness. Patient was evaluated in the context of the global COVID-19 pandemic, which necessitated consideration that the patient might be at risk for infection with the SARS-CoV-2 virus that causes COVID-19. Institutional protocols and algorithms that pertain to the evaluation of patients at risk for COVID-19 are in a state of rapid change  based on information released by regulatory bodies including the CDC and federal and state organizations. These policies and algorithms were followed during the patient's care in the ED.  Some ED evaluations and interventions may be delayed as a result of limited staffing during the pandemic.       Alfred Levins, [place], MD 02/28/22 [phone]

## 2022-04-18 NOTE — Congregational Nurse Program (Signed)
  Dept: [phone]   Congregational Nurse Program Note  Date of Encounter: 04/18/2022 Client to clinic for follow up first aid to his right ear. No redness noted, some dried serous fluid noted in the ear canal. Area cleansed and antibiotic ointment applied to small spot on his outer right ear. No pain on palpation to area behind the ear. No other needs/concerns at this time. RN encouraged client to continue to return to clinic for continued monitoring. He voiced understanding and agreement. Past Medical History: Past Medical History:  Diagnosis Date   Depression     Encounter Details:  CNP Questionnaire - 04/18/22 1258       Questionnaire   Do you give verbal consent to treat you today? Yes    Location Patient Served  [facility]    Visit Setting Church or Organization    Patient Status Homeless    Insurance Uninsured ([employer]s/Self-Pay)   client is unsure if he has insurance or not   Insurance Referral N/A    Medication N/A   currenlty not taking any medications   Medical Provider No    Screening Referrals N/A    Medical Referral N/A    Medical Appointment Made N/A    Food Have Food Insecurities    Transportation N/A   client continues to use the [place] bus system   Housing/Utilities No permanent housing    Interpersonal Safety N/A    Intervention Support   first aide to right ear   ED Visit Averted N/A    Life-Saving Intervention Made N/A

## 2022-04-19 NOTE — Congregational Nurse Program (Signed)
  Dept: [phone]   Congregational Nurse Program Note  Date of Encounter: 04/17/2022 Client to clinic for first aid to scratch on his right outer ear. Area with slight redness, swelling and dried blood noted. Area cleansed with wound wash and antibiotic ointment applied. Unable to visualize the inner ear as clinic does not have an otoscope. Client is not have and hearing loss. He does report some drainage from his ear. He was agreeable to coming back to the clinic tomorrow for monitoring of healing. Past Medical History: Past Medical History:  Diagnosis Date   Depression     Encounter Details:  CNP Questionnaire - 04/17/22 1030       Questionnaire   Do you give verbal consent to treat you today? Yes    Location Patient Served  [facility]    Visit Setting Church or Organization    Patient Status Homeless    Insurance Uninsured ([employer] Connects/Self-Pay)    Insurance Referral N/A    Medication N/A    Medical Provider No    Screening Referrals N/A    Medical Referral N/A    Medical Appointment Made N/A    Food Have Food Insecurities   client does have food stamps   Housing/Utilities No permanent housing    Interpersonal Safety N/A    Intervention Support    ED Visit Averted N/A    Life-Saving Intervention Made N/A

## 2022-04-25 NOTE — Congregational Nurse Program (Signed)
  Dept: [phone]   Congregational Nurse Program Note  Date of Encounter: 04/25/2022 Client to clinic with request for RN to evaluate his right ear. He reports that is still feels swollen. Outer ear with no swelling or redness. Some wax noted to entrance of ear canal. Hydrogen peroxide drops placed with clients permission. Ear gently cleaned and dried. No other needs  at this time.  Past Medical History: Past Medical History:  Diagnosis Date   Depression     Encounter Details:  CNP Questionnaire - 04/25/22 1230       Questionnaire   Do you give verbal consent to treat you today? Yes    Location Patient Served  [facility]    Visit Setting Church or Organization    Patient Status Homeless    Insurance Uninsured ([employer]s/Self-Pay)   client is unsure if he has insurance or not   Insurance Referral N/A    Medication N/A   currenlty not taking any medications   Medical Provider No   client declines assistance with obtaining PCP/[facility]   Screening Referrals N/A    Medical Referral N/A    Medical Appointment Made N/A    Food Have Food Insecurities    Transportation N/A   client continues to use the [place] bus system   Housing/Utilities No permanent housing    Interpersonal Safety N/A    Intervention Support;Counsel   first aide to right ear   ED Visit Averted N/A    Life-Saving Intervention Made N/A

## 2022-04-27 ENCOUNTER — Emergency Department
Admission: EM | Admit: 2022-04-27 | Discharge: 2022-04-27 | Disposition: A | Discharge status: Home / Self Care | Payer: Self-pay | Attending: Emergency Medicine | Admitting: Emergency Medicine

## 2022-04-27 ENCOUNTER — Other Ambulatory Visit: Payer: Self-pay

## 2022-04-27 DIAGNOSIS — H6501 Acute serous otitis media, right ear: Secondary | ICD-10-CM | POA: Insufficient documentation

## 2022-04-27 DIAGNOSIS — H6500 Acute serous otitis media, unspecified ear: Secondary | ICD-10-CM

## 2022-04-27 DIAGNOSIS — H60501 Unspecified acute noninfective otitis externa, right ear: Secondary | ICD-10-CM | POA: Insufficient documentation

## 2022-04-27 MED ORDER — CIPROFLOXACIN-DEXAMETHASONE 0.3-0.1 % OT SUSP
4.0000 [drp] | Freq: Once | OTIC | Status: AC
  Administered 2022-04-27: 4 [drp] via OTIC
  Filled 2022-04-27: qty 7.5

## 2022-04-27 MED ORDER — AMOXICILLIN 875 MG PO TABS: 875.0000 mg | ORAL_TABLET | Freq: Two times a day (BID) | ORAL | 0 refills | Status: DC

## 2022-04-27 MED ORDER — AMOXICILLIN 500 MG PO CAPS
500.0000 mg | ORAL_CAPSULE | Freq: Once | ORAL | Status: AC
  Administered 2022-04-27: 500 mg via ORAL
  Filled 2022-04-27: qty 1

## 2022-04-27 MED ORDER — CIPROFLOXACIN HCL 0.2 % OT SOLN: 0.2000 mL | Freq: Two times a day (BID) | OTIC | 0 refills | Status: DC

## 2022-04-27 NOTE — ED Provider Notes (Signed)
[facility] Provider Note    Event Date/Time   First MD Initiated Contact with Patient 04/27/22 2223     (approximate)   History   Otalgia   HPI  Richard Mclean. is a 46 y.o. male presents emergency department with complaints of right ear pain with a brown drainage and clear liquid crusting.  Patient states he has had problems with his ear for about 2 weeks.  Started after being in jail.  No fever or chills but does feel warm.      Physical Exam   Triage Vital Signs: ED Triage Vitals  Enc Vitals Group     BP 04/27/22 2118 130/80     Pulse Rate 04/27/22 2118 87     Resp 04/27/22 2118 18     Temp 04/27/22 2118 98.9 F (37.2 C)     Temp Source 04/27/22 2118 Oral     SpO2 04/27/22 2118 97 %     Weight 04/27/22 2119 265 lb (120.2 kg)     Height 04/27/22 2119 5\' 9"  (1.753 m)     Head Circumference --      Peak Flow --      Pain Score 04/27/22 2117 10     Pain Loc --      Pain Edu? --      Excl. in GC? --     Most recent vital signs: Vitals:   04/27/22 2118  BP: 130/80  Pulse: 87  Resp: 18  Temp: 98.9 F (37.2 C)  SpO2: 97%     General: Awake, no distress.   CV:  Good peripheral perfusion. regular rate and  rhythm Resp:  Normal effort. Lungs CTA Abd:  No distention.   Other:  Right ear with a clear liquidy drainage along with some brown wax noted, unable to see TM past the liquid, right tonsillar gland tender to palpation, neck is supple,   ED Results / Procedures / Treatments   Labs (all labs ordered are listed, but only abnormal results are displayed) Labs Reviewed - No data to display   EKG     RADIOLOGY     PROCEDURES:   Procedures   MEDICATIONS ORDERED IN ED: Medications  amoxicillin (AMOXIL) capsule 500 mg (has no administration in time range)  ciprofloxacin-dexamethasone (CIPRODEX) 0.3-0.1 % OTIC (EAR) suspension 4 drop (has no administration in time range)     IMPRESSION / MDM / ASSESSMENT AND  PLAN / ED COURSE  I reviewed the triage vital signs and the nursing notes.                              Differential diagnosis includes, but is not limited to, acute otitis media, acute otitis externa, cellulitis  Patient's presentation is most consistent with acute, uncomplicated illness.   Patient's exam is consistent with a otitis externa and media.  Will place patient on amoxicillin and Cipro otic drops.  Patient is to follow-up with his regular doctor if not improving in 3 days or with [facility] ENT.  He can call for appointment.  He is in agreement treatment plan.  Discharged stable condition.      FINAL CLINICAL IMPRESSION(S) / ED DIAGNOSES   Final diagnoses:  Acute otitis externa of right ear, unspecified type  Acute serous otitis media, recurrence not specified, unspecified laterality     Rx / DC Orders   ED Discharge Orders  Ordered    amoxicillin (AMOXIL) 875 MG tablet  2 times daily,   Status:  Discontinued        04/27/22 2230    Ciprofloxacin HCl 0.2 % otic solution  2 times daily        04/27/22 2230    amoxicillin (AMOXIL) 875 MG tablet  2 times daily        04/27/22 2230             Note:  This document was prepared using Dragon voice recognition software and may include unintentional dictation errors.    Faythe Ghee, PA-C 04/27/22 2232    Sharman Cheek, MD 05/07/22 1336

## 2022-04-27 NOTE — ED Triage Notes (Signed)
Right ear pain x 2 weeks. Pt states he feels like his ear swells up for a day or two and then he has purulent drainage from ear and it feels like it runs down the back of his throat.  States drainage in brown in color and continuously oozes from ear.  Also states he feels like something is continuously crawling in ear.  C/o headache due to ear pain.

## 2022-04-27 NOTE — Discharge Instructions (Signed)
Follow-up with your regular doctor if not improving or follow-up with the ENT if not improving.  Use medication as prescribed.  Return if worsening

## 2022-06-02 ENCOUNTER — Emergency Department (HOSPITAL_COMMUNITY): Payer: Self-pay

## 2022-06-02 ENCOUNTER — Emergency Department (HOSPITAL_COMMUNITY)
Admission: EM | Admit: 2022-06-02 | Discharge: 2022-06-02 | Disposition: A | Discharge status: Home / Self Care | Payer: Self-pay | Attending: Emergency Medicine | Admitting: Emergency Medicine

## 2022-06-02 ENCOUNTER — Other Ambulatory Visit: Payer: Self-pay

## 2022-06-02 DIAGNOSIS — Y908 Blood alcohol level of 240 mg/100 ml or more: Secondary | ICD-10-CM | POA: Insufficient documentation

## 2022-06-02 DIAGNOSIS — S0990XA Unspecified injury of head, initial encounter: Secondary | ICD-10-CM

## 2022-06-02 DIAGNOSIS — W1812XA Fall from or off toilet with subsequent striking against object, initial encounter: Secondary | ICD-10-CM | POA: Insufficient documentation

## 2022-06-02 DIAGNOSIS — Y92002 Bathroom of unspecified non-institutional (private) residence single-family (private) house as the place of occurrence of the external cause: Secondary | ICD-10-CM | POA: Insufficient documentation

## 2022-06-02 DIAGNOSIS — F10120 Alcohol abuse with intoxication, uncomplicated: Secondary | ICD-10-CM | POA: Insufficient documentation

## 2022-06-02 DIAGNOSIS — S0093XA Contusion of unspecified part of head, initial encounter: Secondary | ICD-10-CM | POA: Insufficient documentation

## 2022-06-02 DIAGNOSIS — F10920 Alcohol use, unspecified with intoxication, uncomplicated: Secondary | ICD-10-CM

## 2022-06-02 LAB — BASIC METABOLIC PANEL
Anion gap: 9 (ref 5–15)
BUN: 7 mg/dL (ref 6–20)
CO2: 23 mmol/L (ref 22–32)
Calcium: 8.7 mg/dL — ABNORMAL LOW (ref 8.9–10.3)
Chloride: 109 mmol/L (ref 98–111)
Creatinine, Ser: 0.9 mg/dL (ref 0.61–1.24)
GFR, Estimated: >60 mL/min (ref >60)
Glucose, Bld: 124 mg/dL — ABNORMAL HIGH (ref 70–99)
Potassium: 3.5 mmol/L (ref 3.5–5.1)
Sodium: 141 mmol/L (ref 135–145)

## 2022-06-02 LAB — CBC WITH DIFFERENTIAL/PLATELET
Abs Immature Granulocytes: 0.03 10*3/uL (ref 0.00–0.07)
Basophils Absolute: 0.1 10*3/uL (ref 0.0–0.1)
Basophils Relative: 1 %
Eosinophils Absolute: 0.1 10*3/uL (ref 0.0–0.5)
Eosinophils Relative: 2 %
HCT: 41.5 % (ref 39.0–52.0)
Hemoglobin: 14.6 g/dL (ref 13.0–17.0)
Immature Granulocytes: 0 %
Lymphocytes Relative: 29 %
Lymphs Abs: 2.3 10*3/uL (ref 0.7–4.0)
MCH: 33.6 pg (ref 26.0–34.0)
MCHC: 35.2 g/dL (ref 30.0–36.0)
MCV: 95.4 fL (ref 80.0–100.0)
Monocytes Absolute: 0.5 10*3/uL (ref 0.1–1.0)
Monocytes Relative: 6 %
Neutro Abs: 4.8 10*3/uL (ref 1.7–7.7)
Neutrophils Relative %: 62 %
Platelets: 236 10*3/uL (ref 150–400)
RBC: 4.35 MIL/uL (ref 4.22–5.81)
RDW: 12.8 % (ref 11.5–15.5)
WBC: 7.8 10*3/uL (ref 4.0–10.5)
nRBC: 0 % (ref 0.0–0.2)

## 2022-06-02 LAB — ETHANOL: Alcohol, Ethyl (B): 322 mg/dL (ref <10)

## 2022-06-02 MED ORDER — SODIUM CHLORIDE 0.9 % IV BOLUS
1000.0000 mL | Freq: Once | INTRAVENOUS | Status: AC
  Administered 2022-06-02: 1000 mL via INTRAVENOUS

## 2022-06-02 NOTE — ED Notes (Signed)
Pt has a [employer], but brought from [place]. Pt may need to have pharmacy reassessed if prescriptions are written at DC.

## 2022-06-02 NOTE — ED Notes (Signed)
Taxi arranged and arrived for pt to be transported to his mother's house, less than 2 miles away. This RN visualized pt entering car, aware of transport back to her house.

## 2022-06-02 NOTE — ED Triage Notes (Signed)
Pt to the ED with complaints of falling off the toilet after being startled and hitting his head and nose.  Pt has a minor laceration on the back of his head with bleeding controlled. Pt has had some minor bleeding to his nose.

## 2022-06-02 NOTE — ED Notes (Signed)
Pt given bag of NS and hot lunch and soda. Compliant, receiving fluid at this time.

## 2022-06-02 NOTE — Discharge Instructions (Signed)
I would advise you to stop drinking.  Please return to the emergency department for any worsening symptoms.

## 2022-06-02 NOTE — ED Notes (Addendum)
Pt brought in by [facility] from his mother's house. Mother called 911 and reported to EMS that he needed "mental help", but she did not report if he had been diagnosed with any mental health problems. Pt reported to EMS that he was startled by a cat and fell off the toilet. He also reported to EMS that he had been drinking "a hell of a lot". Pt is mostly pleasant and cooperative. When asking pt why he is here today he will not answer and just stare at me. Pt appears to have small abrasion to back of his head. Blood to tip of his nose, but no cut seen.

## 2022-06-02 NOTE — ED Notes (Signed)
Pt up out of bed twice after return from CT, reminded he needs to stay in bed for unsteady gait and to continue being monitored. Pt refused and started to put clothes on, got lighters and cigarettes out of coat, and said he's leaving to go smoke. This RN stated he cannot smoke on hospital premises and cannot leave due to concern of pt's safety at this time. Pt reluctant to stay, PA and security brought to bedside.

## 2022-06-02 NOTE — ED Provider Notes (Signed)
[facility] EMERGENCY DEPARTMENT Provider Note   CSN: [id number] Arrival date & time: 06/02/22  1840     History Chief Complaint  Patient presents with   Alcohol Intoxication    Richard Mclean. is a 46 y.o. male patient who presents to the emergency department with a head injury secondary to falling off the toilet.  He states that he was startled by a cat.  He also mentions that he has been drinking heavily today.  It is difficult to get additional history secondary to patient's current condition.   Alcohol Intoxication       Home Medications Prior to Admission medications   Medication Sig Start Date End Date Taking? Authorizing Provider  amoxicillin (AMOXIL) 875 MG tablet Take 1 tablet (875 mg total) by mouth 2 (two) times daily. 04/27/22   Fisher, Roselyn Bering, PA-C  Ciprofloxacin HCl 0.2 % otic solution Place 0.2 mLs into the right ear 2 (two) times daily. 04/27/22   Fisher, Roselyn Bering, PA-C  multivitamin (ONE-A-DAY MEN'S) TABS tablet Take 1 tablet by mouth daily. Patient not taking: Reported on 06/21/2021 02/13/21   Ward, Layla Maw, DO  naproxen (NAPROSYN) 500 MG tablet Take 1 tablet (500 mg total) by mouth 2 (two) times daily with a meal. 01/06/22 01/06/23  Joni Reining, PA-C  thiamine 100 MG tablet Take 1 tablet (100 mg total) by mouth daily. Patient not taking: Reported on 06/21/2021 02/13/21   Ward, Layla Maw, DO      Allergies    Patient has no known allergies.    Review of Systems   Review of Systems  All other systems reviewed and are negative.   Physical Exam Updated Vital Signs BP (!) 146/95   Pulse (!) 101   Temp 98.2 F (36.8 C) (Oral)   Resp 12   Ht 5\' 9"  (1.753 m)   Wt 120.2 kg   SpO2 96%   BMI 39.13 kg/m  Physical Exam Vitals and nursing note reviewed.  Constitutional:      General: He is not in acute distress.    Appearance: Normal appearance.     Comments: Intoxicated.   HENT:     Head: Normocephalic and atraumatic.      Nose: Nose normal.      Right Nostril: No epistaxis or septal hematoma.     Left Nostril: No epistaxis or septal hematoma.     Right Turbinates: Not enlarged or swollen.     Left Turbinates: Not enlarged or swollen.   Eyes:     General:        Right eye: No discharge.        Left eye: No discharge.  Cardiovascular:     Comments: Regular rate and rhythm.  S1/S2 are distinct without any evidence of murmur, rubs, or gallops.  Radial pulses are 2+ bilaterally.  Dorsalis pedis pulses are 2+ bilaterally.  No evidence of pedal edema. Pulmonary:     Comments: Clear to auscultation bilaterally.  Normal effort.  No respiratory distress.  No evidence of wheezes, rales, or rhonchi heard throughout. Abdominal:     General: Abdomen is flat. Bowel sounds are normal. There is no distension.     Tenderness: There is no abdominal tenderness. There is no guarding or rebound.  Musculoskeletal:        General: Normal range of motion.     Cervical back: Neck supple.  Skin:    General: Skin is warm and dry.     Findings: No  rash.  Neurological:     General: No focal deficit present.     Mental Status: He is alert.  Psychiatric:        Mood and Affect: Mood normal.        Behavior: Behavior normal. Behavior is cooperative.     ED Results / Procedures / Treatments   Labs (all labs ordered are listed, but only abnormal results are displayed) Labs Reviewed  BASIC METABOLIC PANEL - Abnormal; Notable for the following components:      Result Value   Glucose, Bld 124 (*)    Calcium 8.7 (*)    All other components within normal limits  ETHANOL - Abnormal; Notable for the following components:   Alcohol, Ethyl (B) 322 (*)    All other components within normal limits  CBC WITH DIFFERENTIAL/PLATELET    EKG None  Radiology CT Head Wo Contrast  Result Date: 06/02/2022 CLINICAL DATA:  Fall, head injury EXAM: CT HEAD WITHOUT CONTRAST CT CERVICAL SPINE WITHOUT CONTRAST TECHNIQUE: Multidetector CT imaging of the head and  cervical spine was performed following the standard protocol without intravenous contrast. Multiplanar CT image reconstructions of the cervical spine were also generated. RADIATION DOSE REDUCTION: This exam was performed according to the departmental dose-optimization program which includes automated exposure control, adjustment of the mA and/or kV according to patient size and/or use of iterative reconstruction technique. COMPARISON:  02/28/2022 FINDINGS: CT HEAD FINDINGS Brain: No evidence of acute infarction, hemorrhage, hydrocephalus, extra-axial collection or mass lesion/mass effect. Vascular: No hyperdense vessel or unexpected calcification. Skull: Normal. Negative for fracture or focal lesion. Sinuses/Orbits: The visualized paranasal sinuses are essentially clear. The mastoid air cells are unopacified. Other: None. CT CERVICAL SPINE FINDINGS Alignment: Normal cervical lordosis. Skull base and vertebrae: No acute fracture. No primary bone lesion or focal pathologic process. Soft tissues and spinal canal: No prevertebral fluid or swelling. No visible canal hematoma. Disc levels: Intervertebral disc spaces are maintained. Spinal canal is patent. Upper chest: Visualized lung apices are clear. Other: Visualized thyroid is unremarkable. IMPRESSION: Normal head CT. Normal cervical spine CT. Electronically Signed   By: Charline Bills M.D.   On: 06/02/2022 19:53   CT Cervical Spine Wo Contrast  Result Date: 06/02/2022 CLINICAL DATA:  Fall, head injury EXAM: CT HEAD WITHOUT CONTRAST CT CERVICAL SPINE WITHOUT CONTRAST TECHNIQUE: Multidetector CT imaging of the head and cervical spine was performed following the standard protocol without intravenous contrast. Multiplanar CT image reconstructions of the cervical spine were also generated. RADIATION DOSE REDUCTION: This exam was performed according to the departmental dose-optimization program which includes automated exposure control, adjustment of the mA and/or kV  according to patient size and/or use of iterative reconstruction technique. COMPARISON:  02/28/2022 FINDINGS: CT HEAD FINDINGS Brain: No evidence of acute infarction, hemorrhage, hydrocephalus, extra-axial collection or mass lesion/mass effect. Vascular: No hyperdense vessel or unexpected calcification. Skull: Normal. Negative for fracture or focal lesion. Sinuses/Orbits: The visualized paranasal sinuses are essentially clear. The mastoid air cells are unopacified. Other: None. CT CERVICAL SPINE FINDINGS Alignment: Normal cervical lordosis. Skull base and vertebrae: No acute fracture. No primary bone lesion or focal pathologic process. Soft tissues and spinal canal: No prevertebral fluid or swelling. No visible canal hematoma. Disc levels: Intervertebral disc spaces are maintained. Spinal canal is patent. Upper chest: Visualized lung apices are clear. Other: Visualized thyroid is unremarkable. IMPRESSION: Normal head CT. Normal cervical spine CT. Electronically Signed   By: Charline Bills M.D.   On: 06/02/2022 19:53  Procedures Procedures    Medications Ordered in ED Medications  sodium chloride 0.9 % bolus 1,000 mL (1,000 mLs Intravenous New Bag/Given 06/02/22 1942)    ED Course/ Medical Decision Making/ A&P Clinical Course as of 06/02/22 2053  Sat Jun 02, 2022  2051 CBC with Differential Normal. [CF]  2051 Basic metabolic panel(!) Elevated glucose otherwise normal. [CF]  2051 Ethanol(!!) Elevated ethanol this seems to be at the patient's baseline in comparison to previous results. [CF]    Clinical Course User Index [CF] Teressa Lower, PA-C                           Medical Decision Making Richard Uzzle Geovany Trudo. is a 46 y.o. male patient who presents to the emergency department today for further evaluation of head injury.  Patient is clearly intoxicated and is difficult to get an accurate history.  I will plan to scan his head and neck in addition to get some basic labs.  I will also  give him some fluids.  He ultimately need to stay for acute alcohol intoxication for observation in the ED.  Patient does have an elevated ethanol but this seems to be at the patient's baseline in comparison to previous results.  Patient is definitely much more with it after fluids and 2 hours here in the emergency department.  I do not feel comfortable with him walking.  We will order him an Bened[employer] Goad to get him to his mother's house who lives 5 minutes away.  Patient amenable to this plan.  I advised him to stop drinking.  All questions or concerns addressed.  No signs of intracranial or intracervical pathology.  He is safe for discharge.  Amount and/or Complexity of Data Reviewed Labs: ordered. Decision-making details documented in ED Course. Radiology: ordered.   Final Clinical Impression(s) / ED Diagnoses Final diagnoses:  Alcoholic intoxication without complication [facility])    Rx / DC Orders ED Discharge Orders     None         Jolyn Lent 06/02/22 2053    Bethann Berkshire, MD 06/04/22 1148

## 2022-06-02 NOTE — ED Notes (Addendum)
Pt's mom states pt can return back to her house tonight. Attempting to arrange for transportation back to to her house.

## 2022-06-02 NOTE — ED Notes (Signed)
Pt's brother, Reuel Boom [phone]), called for an update. He stated that pt is homeless, lives in [place], and was over at his mother's house this afternoon when she called EMS in concern over pt's intoxication.

## 2022-06-02 NOTE — ED Notes (Signed)
Patient verbalizes understanding of discharge instructions. Opportunity for questioning and answers were provided. Armband removed by staff, pt discharged from ED. Ambulated out to bench, awaiting taxi.

## 2022-06-20 NOTE — Congregational Nurse Program (Signed)
  Dept: [phone]   Congregational Nurse Program Note  Date of Encounter: 06/20/2022 Client to [facility] clinic with request for eye drops d/t dry irritated eyes. Over the counter dry eye relief eye drops administered. No other needs at this time.  Past Medical History: Past Medical History:  Diagnosis Date   Depression     Encounter Details:  CNP Questionnaire - 06/20/22 0835       Questionnaire   Do you give verbal consent to treat you today? Yes    Location Patient [place] or Organization    Patient Status Homeless    Insurance Uninsured ([employer] [employer]/Self-Pay)   client is unsure if he has insurance or not   Insurance Referral N/A    Medication N/A   currenlty not taking any medications   Medical Provider No   client declines assistance with obtaining PCP/[facility]   Screening Referrals N/A    Medical Referral N/A    Medical Appointment Made N/A    Food Have Food Insecurities    Transportation N/A   client continues to use the [place] bus system   Housing/Utilities No permanent housing    Interpersonal Safety N/A    Intervention Support;Counsel   first aide to right ear   ED Visit Averted N/A    Life-Saving Intervention Made N/A

## 2022-06-23 ENCOUNTER — Emergency Department: Payer: Self-pay

## 2022-06-23 ENCOUNTER — Other Ambulatory Visit: Payer: Self-pay

## 2022-06-23 ENCOUNTER — Emergency Department
Admission: EM | Admit: 2022-06-23 | Discharge: 2022-06-23 | Disposition: A | Discharge status: Home / Self Care | Payer: Self-pay | Attending: Emergency Medicine | Admitting: Emergency Medicine

## 2022-06-23 DIAGNOSIS — Z20822 Contact with and (suspected) exposure to covid-19: Secondary | ICD-10-CM | POA: Insufficient documentation

## 2022-06-23 DIAGNOSIS — F10129 Alcohol abuse with intoxication, unspecified: Secondary | ICD-10-CM | POA: Insufficient documentation

## 2022-06-23 DIAGNOSIS — J209 Acute bronchitis, unspecified: Secondary | ICD-10-CM | POA: Insufficient documentation

## 2022-06-23 DIAGNOSIS — Y908 Blood alcohol level of 240 mg/100 ml or more: Secondary | ICD-10-CM | POA: Insufficient documentation

## 2022-06-23 DIAGNOSIS — R531 Weakness: Secondary | ICD-10-CM | POA: Diagnosis not present

## 2022-06-23 DIAGNOSIS — R5383 Other fatigue: Secondary | ICD-10-CM | POA: Insufficient documentation

## 2022-06-23 DIAGNOSIS — R000 Tachycardia, unspecified: Secondary | ICD-10-CM | POA: Diagnosis not present

## 2022-06-23 DIAGNOSIS — R509 Fever, unspecified: Secondary | ICD-10-CM | POA: Diagnosis not present

## 2022-06-23 DIAGNOSIS — R112 Nausea with vomiting, unspecified: Secondary | ICD-10-CM | POA: Insufficient documentation

## 2022-06-23 DIAGNOSIS — F1010 Alcohol abuse, uncomplicated: Secondary | ICD-10-CM | POA: Insufficient documentation

## 2022-06-23 LAB — CBC WITH DIFFERENTIAL/PLATELET
Abs Immature Granulocytes: 0.03 10*3/uL (ref 0.00–0.07)
Basophils Absolute: 0.1 10*3/uL (ref 0.0–0.1)
Basophils Relative: 1 %
Eosinophils Absolute: 0 10*3/uL (ref 0.0–0.5)
Eosinophils Relative: 0 %
HCT: 42.5 % (ref 39.0–52.0)
Hemoglobin: 14.5 g/dL (ref 13.0–17.0)
Immature Granulocytes: 0 %
Lymphocytes Relative: 6 %
Lymphs Abs: 0.7 10*3/uL (ref 0.7–4.0)
MCH: 33.3 pg (ref 26.0–34.0)
MCHC: 34.1 g/dL (ref 30.0–36.0)
MCV: 97.5 fL (ref 80.0–100.0)
Monocytes Absolute: 1.2 10*3/uL — ABNORMAL HIGH (ref 0.1–1.0)
Monocytes Relative: 9 %
Neutro Abs: 10.5 10*3/uL — ABNORMAL HIGH (ref 1.7–7.7)
Neutrophils Relative %: 84 %
Platelets: 203 10*3/uL (ref 150–400)
RBC: 4.36 MIL/uL (ref 4.22–5.81)
RDW: 12.4 % (ref 11.5–15.5)
WBC: 12.6 10*3/uL — ABNORMAL HIGH (ref 4.0–10.5)
nRBC: 0 % (ref 0.0–0.2)

## 2022-06-23 LAB — COMPREHENSIVE METABOLIC PANEL
ALT: 22 U/L (ref 0–44)
AST: 29 U/L (ref 15–41)
Albumin: 3.6 g/dL (ref 3.5–5.0)
Alkaline Phosphatase: 63 U/L (ref 38–126)
Anion gap: 11 (ref 5–15)
BUN: 8 mg/dL (ref 6–20)
CO2: 21 mmol/L — ABNORMAL LOW (ref 22–32)
Calcium: 8.2 mg/dL — ABNORMAL LOW (ref 8.9–10.3)
Chloride: 106 mmol/L (ref 98–111)
Creatinine, Ser: 0.88 mg/dL (ref 0.61–1.24)
GFR, Estimated: >60 mL/min (ref >60)
Glucose, Bld: 113 mg/dL — ABNORMAL HIGH (ref 70–99)
Potassium: 3.7 mmol/L (ref 3.5–5.1)
Sodium: 138 mmol/L (ref 135–145)
Total Bilirubin: 0.6 mg/dL (ref 0.3–1.2)
Total Protein: 6.3 g/dL — ABNORMAL LOW (ref 6.5–8.1)

## 2022-06-23 LAB — RESP PANEL BY RT-PCR (FLU A&B, COVID) ARPGX2
Influenza A by PCR: NEGATIVE
Influenza B by PCR: NEGATIVE
SARS Coronavirus 2 by RT PCR: NEGATIVE

## 2022-06-23 LAB — PROCALCITONIN: Procalcitonin: <0.1 ng/mL

## 2022-06-23 LAB — ETHANOL: Alcohol, Ethyl (B): 143 mg/dL — ABNORMAL HIGH (ref <10)

## 2022-06-23 LAB — LACTIC ACID, PLASMA: Lactic Acid, Venous: 1.9 mmol/L (ref 0.5–1.9)

## 2022-06-23 LAB — SARS CORONAVIRUS 2 BY RT PCR: SARS Coronavirus 2 by RT PCR: NEGATIVE

## 2022-06-23 MED ORDER — ACETAMINOPHEN 500 MG PO TABS: 1000.0000 mg | ORAL_TABLET | Freq: Once | ORAL | Status: AC

## 2022-06-23 MED ORDER — ALBUTEROL SULFATE HFA 108 (90 BASE) MCG/ACT IN AERS
2.0000 | INHALATION_SPRAY | Freq: Four times a day (QID) | RESPIRATORY_TRACT | Status: DC | PRN
  Administered 2022-06-23 (×2): 2 via RESPIRATORY_TRACT
  Filled 2022-06-23: qty 6.7

## 2022-06-23 MED ORDER — DEXAMETHASONE SODIUM PHOSPHATE 10 MG/ML IJ SOLN
10.0000 mg | Freq: Once | INTRAMUSCULAR | Status: AC
  Administered 2022-06-23: 10 mg via INTRAVENOUS
  Filled 2022-06-23: qty 1

## 2022-06-23 MED ORDER — ONDANSETRON HCL 4 MG/2ML IJ SOLN
4.0000 mg | INTRAMUSCULAR | Status: AC
  Administered 2022-06-23: 4 mg via INTRAVENOUS
  Filled 2022-06-23: qty 2

## 2022-06-23 MED ORDER — ONDANSETRON 4 MG PO TBDP: 4.0000 mg | ORAL_TABLET | Freq: Four times a day (QID) | ORAL | 0 refills | Status: DC | PRN

## 2022-06-23 MED ORDER — ACETAMINOPHEN 500 MG PO TABS
ORAL_TABLET | ORAL | Status: AC
  Administered 2022-06-23: 1000 mg via ORAL
  Filled 2022-06-23: qty 2

## 2022-06-23 MED ORDER — LACTATED RINGERS IV BOLUS
1000.0000 mL | Freq: Once | INTRAVENOUS | Status: AC
  Administered 2022-06-23: 1000 mL via INTRAVENOUS

## 2022-06-23 MED ORDER — KETOROLAC TROMETHAMINE 30 MG/ML IJ SOLN
15.0000 mg | Freq: Once | INTRAMUSCULAR | Status: AC
  Administered 2022-06-23: 15 mg via INTRAVENOUS
  Filled 2022-06-23: qty 1

## 2022-06-23 MED ORDER — ALBUTEROL SULFATE (2.5 MG/3ML) 0.083% IN NEBU: 3.0000 mL | INHALATION_SOLUTION | Freq: Once | RESPIRATORY_TRACT | Status: DC

## 2022-06-23 MED ORDER — AZITHROMYCIN 250 MG PO TABS: ORAL_TABLET | ORAL | 0 refills | Status: DC

## 2022-06-23 MED ORDER — AZITHROMYCIN 500 MG PO TABS
500.0000 mg | ORAL_TABLET | Freq: Once | ORAL | Status: AC
  Administered 2022-06-23: 500 mg via ORAL
  Filled 2022-06-23: qty 1

## 2022-06-23 NOTE — ED Notes (Signed)
Covid swab sent to the lab at this time.  

## 2022-06-23 NOTE — ED Triage Notes (Signed)
Pt brought by EMS for body aches, fever, chills, ETOH intoxication, fatigue, drowsiness. Pt's speech slurred during triage (admits to drinking alcohol tonight), falling asleep mid-question. No noticeable dyspnea or trouble breathing.

## 2022-06-23 NOTE — Discharge Instructions (Addendum)
Please utilize the albuterol inhaler you were given.  You may use up to 2 puffs every 6 hours as needed for wheezing.  Follow-[facility] closely with the [facility].  Return to the ER right away if your symptoms worsen, you have difficulty breathing, you feel excessively short of breath, weak fatigued or other new concerns or symptoms arise

## 2022-06-23 NOTE — ED Notes (Signed)
Pt provided with snacks to leave with.

## 2022-06-23 NOTE — ED Provider Notes (Signed)
[facility] Provider Note    Event Date/Time   First MD Initiated Contact with Patient 06/23/22 [phone]     (approximate)   History   Generalized Body Aches, Vomiting, Fever, Fatigue, and Alcohol Intoxication   HPI  Richard Mclean. is a 46 y.o. male on review of previous records including recent community nurse visit has a history of depression, also previous alcohol abuse  Patient reports he had 2 days of runny nose cough nasal congestion    Patient reports 2 days feeling fatigued.  Last night he reports he felt just very fatigued little bit of nausea but no vomiting.  Currently reports he is hungry would like something to eat.  He does feel little bit short of breath and has noticed that he has been wheezing slightly off-and-on  No abdominal pain.  No diarrhea.  Reports he got nauseated and sort of dry heaves a little bit, but reports its gone away.  Primarily body aches and a fever with upper respiratory congestion and cough.  He does still drink alcohol he reports he drinks about every other day, and denies that he goes through withdrawals  Physical Exam   Triage Vital Signs: ED Triage Vitals  Enc Vitals Group     BP 06/23/22 0305 (!) 152/114     Pulse Rate 06/23/22 0305 (!) 114     Resp 06/23/22 0331 (!) 30     Temp 06/23/22 0305 (!) 101 F (38.3 C)     Temp Source 06/23/22 0305 Oral     SpO2 06/23/22 0305 95 %     Weight 06/23/22 0306 190 lb (86.2 kg)     Height 06/23/22 0306 5\' 10"  (1.778 m)     Head Circumference --      Peak Flow --      Pain Score 06/23/22 0324 0     Pain Loc --      Pain Edu? --      Excl. in GC? --     Most recent vital signs: Vitals:   06/23/22 0331 06/23/22 0721  BP:  130/80  Pulse:  98  Resp: (!) 30 (!) 24  Temp:  100.1 F (37.8 C)  SpO2:  94%     General: Awake, no distress.  Alert and oriented, currently awake asking for something to eat. CV:  Good peripheral perfusion.  Mild  tachycardia Resp:  Normal effort.  Lung sounds with slightly notable wheeze on expiration. Abd:  No distention.  Other:  Denies headache.  No meningismus.  Moves all extremities well to command.   ED Results / Procedures / Treatments   Labs (all labs ordered are listed, but only abnormal results are displayed) Labs Reviewed  COMPREHENSIVE METABOLIC PANEL - Abnormal; Notable for the following components:      Result Value   CO2 21 (*)    Glucose, Bld 113 (*)    Calcium 8.2 (*)    Total Protein 6.3 (*)    All other components within normal limits  CBC WITH DIFFERENTIAL/PLATELET - Abnormal; Notable for the following components:   WBC 12.6 (*)    Neutro Abs 10.5 (*)    Monocytes Absolute 1.2 (*)    All other components within normal limits  ETHANOL - Abnormal; Notable for the following components:   Alcohol, Ethyl (B) 143 (*)    All other components within normal limits  SARS CORONAVIRUS 2 BY RT PCR  RESP PANEL BY RT-PCR (FLU A&B, COVID)  ARPGX2  LACTIC ACID, PLASMA  PROCALCITONIN  LACTIC ACID, PLASMA  CBC WITH DIFFERENTIAL/PLATELET  URINALYSIS, ROUTINE W REFLEX MICROSCOPIC     RADIOLOGY  Chest x-ray interpreted by me is negative for acute infiltrate   DG Chest 2 View  Result Date: 06/23/2022 CLINICAL DATA:  Fever chills and ethanol intoxication EXAM: CHEST - 2 VIEW COMPARISON:  06/21/2022 FINDINGS: Low volume chest. Stable lung markings without focal infiltrate or edema. No effusion or pneumothorax. Normal heart size. IMPRESSION: Stable low volume chest.  No acute finding. Electronically Signed   By: Tiburcio Pea M.D.   On: 06/23/2022 04:14       PROCEDURES:  Critical Care performed: No  Procedures   MEDICATIONS ORDERED IN ED: Medications  albuterol (VENTOLIN HFA) 108 (90 Base) MCG/ACT inhaler 2 puff (2 puffs Inhalation Given 06/23/22 0822)  azithromycin (ZITHROMAX) tablet 500 mg (has no administration in time range)  lactated ringers bolus 1,000 mL (1,000 mLs  Intravenous New Bag/Given 06/23/22 0734)  ondansetron (ZOFRAN) injection 4 mg (4 mg Intravenous Given 06/23/22 0733)  acetaminophen (TYLENOL) tablet 1,000 mg (1,000 mg Oral Given 06/23/22 0731)  ketorolac (TORADOL) 30 MG/ML injection 15 mg (15 mg Intravenous Given 06/23/22 0740)  dexamethasone (DECADRON) injection 10 mg (10 mg Intravenous Given 06/23/22 0745)     IMPRESSION / MDM / ASSESSMENT AND PLAN / ED COURSE  I reviewed the triage vital signs and the nursing notes.                              Differential diagnosis includes, but is not limited to, upper respiratory syndrome, viral etiology, pneumonia, gastritis, flu, COVID etc.  No acute urinary symptoms.  Patient alert oriented, vital signs have improved on second check here at the ER, and remains just slightly tachypneic but with slight wheezing.  Will trial albuterol also dose of steroid for what appears to be reactive airway like disease or bronchitis.  Influenza test and COVID-negative  CBC interpreted as mild leukocytosis with notable left shift.  Comprehensive metabolic panel was reviewed and normal with exception to slightly reduced CO2.  Lactic acid is not elevated arguing against severe sepsis.  Decadron given as steroid for concerns of bronchitis with wheezing.  Patient's presentation is most consistent with acute complicated illness / injury requiring diagnostic workup.   Clinical Course as of 06/23/22 1050  Sat Jun 23, 2022  0825 Patient resting comfortably without distress at this point.  Ethanol level is elevated [MQ]  0900 Patient appears improved.  Currently sitting up, fully alert, eating a breakfast sandwich without distress.  Reports to me that he is feeling better [MQ]    Clinical Course User Index [MQ] Sharyn Creamer, MD   ----------------------------------------- 10:50 AM on 06/23/2022 -----------------------------------------  Vitals:   06/23/22 0331 06/23/22 0721  BP:  130/80  Pulse:  98  Resp: (!) 30  (!) 24  Temp:  100.1 F (37.8 C)  SpO2:  94%    Vital signs of normalized and patient currently resting without distress.  His respiratory rate is 16, he alerts to voice, has been sleeping comfortably in the hallway preceding this, has eaten a meal.  Given the patient's presentation with fever, history of alcohol use, and symptomatology will initiate azithromycin in case of atypical pneumonia.  Recommend follow-up close outpatient, and careful return precautions given as patient is homeless and does not currently have much access to primary care for follow-up.  Return precautions and  treatment recommendations and follow-up discussed with the patient who is agreeable with the plan.   FINAL CLINICAL IMPRESSION(S) / ED DIAGNOSES   Final diagnoses:  Alcohol abuse  Acute bronchitis, unspecified organism     Rx / DC Orders   ED Discharge Orders          Ordered    azithromycin (ZITHROMAX) 250 MG tablet        06/23/22 1049    ondansetron (ZOFRAN-ODT) 4 MG disintegrating tablet  Every 6 hours PRN        06/23/22 1049             Note:  This document was prepared using Dragon voice recognition software and may include unintentional dictation errors.   Delman Kitten, MD 06/23/22 1051

## 2022-07-26 NOTE — Congregational Nurse Program (Signed)
  Dept: [phone]   Congregational Nurse Program Note  Date of Encounter: 07/26/2022 Client to [facility] clinic with request assistance with nail care. Both hand and foot care provided. No other needs at this time.  Past Medical History: Past Medical History:  Diagnosis Date   Depression     Encounter Details:  CNP Questionnaire - 07/26/22 1045       Questionnaire   Ask client: Do you give verbal consent for me to treat you today? Yes    Student Assistance N/A    Location Patient [facility] Mclean    Visit Setting with Client Organization    Patient Status Unhoused    Insurance Uninsured ([place] Card/Care Connects/Self-Pay/Medicaid Family Planning)    Insurance/Financial Assistance Referral N/A    Medication N/A   not currenlty taking any medications   Medical Provider No   client declines assistance with obtaining PCP/[facility]. Client continues to decline being seen at the [facility]   Screening Referrals Made N/A    Medical Referrals Made N/A    Medical Appointment Made N/A    Recently w/o PCP, now 1st time PCP visit completed due to CNs referral or appointment made N/A    Food Have Food Insecurities   client aware of food pantries in the [employer] N/A   client continues to use the [place] bus system   Housing/Utilities No permanent housing    Chiropractor N/A    Interventions Advocate/Support    Abnormal to Normal Screening Since Last CN Visit N/A    Screenings CN Performed N/A    Sent Client to Lab for: N/A    Did client attend any of the following based off CNs referral or appointments made? N/A    ED Visit Averted N/A    Life-Saving Intervention Made N/A

## 2022-07-31 NOTE — Congregational Nurse Program (Signed)
  Dept: [phone]   Congregational Nurse Program Note  Date of Encounter: 07/31/2022 Client to [facility] day center with request for first aid for removal of a sliver in the palm of his right hand. Area cleansed and sliver removed with sterilized needle.Area cleansed again with wound cleanser. No signs of infection. Client appreciative of care provided. No other needs at this time. Past Medical History: Past Medical History:  Diagnosis Date   Depression     Encounter Details:  CNP Questionnaire - 07/31/22 1100       Questionnaire   Ask client: Do you give verbal consent for me to treat you today? Yes    Student Assistance N/A    Location Patient [place]    Visit Setting with Client Organization    Patient Status Unhoused    Insurance Uninsured ([place] Card/Care Connects/Self-Pay/Medicaid Family Planning)   client may have insurance through the exchange   Insurance/Financial Assistance Referral N/A    Medication N/A   not currenlty taking any medications   Medical Provider No   client declines assistance with obtaining PCP/[facility]. Client continues to decline being seen at the [facility]   Screening Referrals Made N/A    Medical Referrals Made N/A    Medical Appointment Made N/A    Recently w/o PCP, now 1st time PCP visit completed due to CNs referral or appointment made N/A    Food Have Food Insecurities   client aware of food pantries in the [place] N/A   client continues to use the [place] bus system   Housing/Utilities No permanent housing    Chiropractor N/A    Interventions Advocate/Support;Educate    Abnormal to Normal Screening Since Last CN Visit N/A    Screenings CN Performed N/A    Sent Client to Lab for: N/A    Did client attend any of the following based off CNs referral or appointments made? N/A    ED Visit Averted N/A    Life-Saving Intervention Made N/A

## 2022-08-05 IMAGING — DX DG HAND COMPLETE 3+V*L*
4 series · 4 of 4 positions shown · non-contrast
Comparison: None.

CLINICAL DATA: Status post fall.

EXAM:
LEFT HAND - COMPLETE 3+ VIEW

[hand ap]
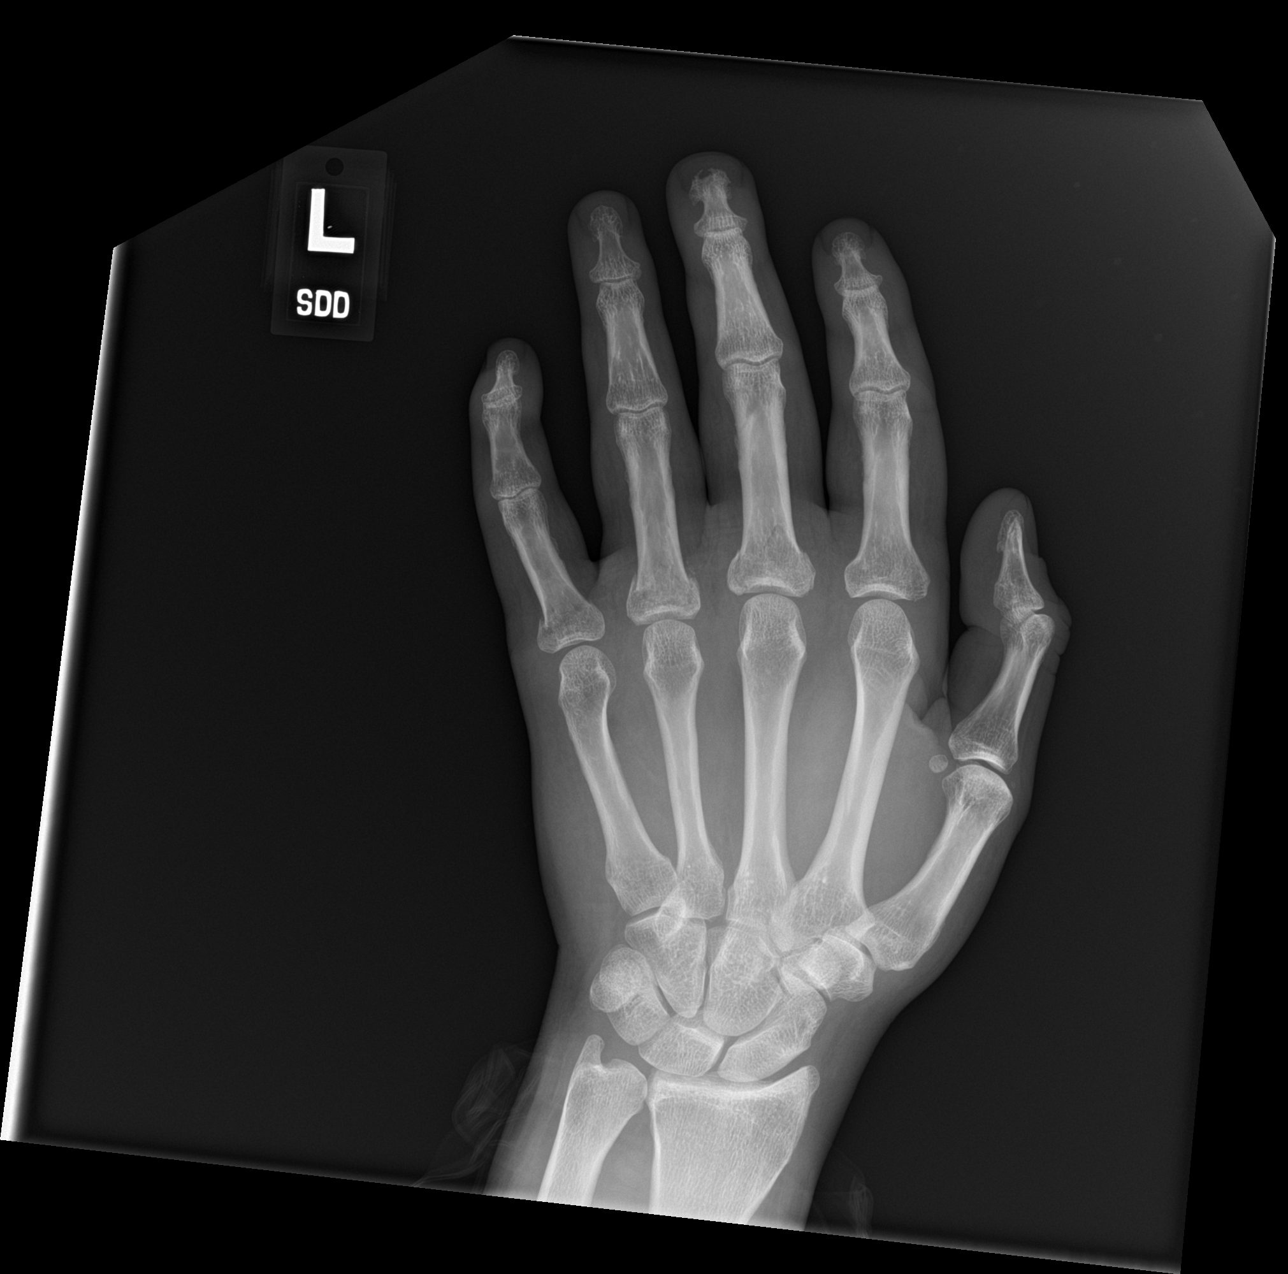

[hand obl]
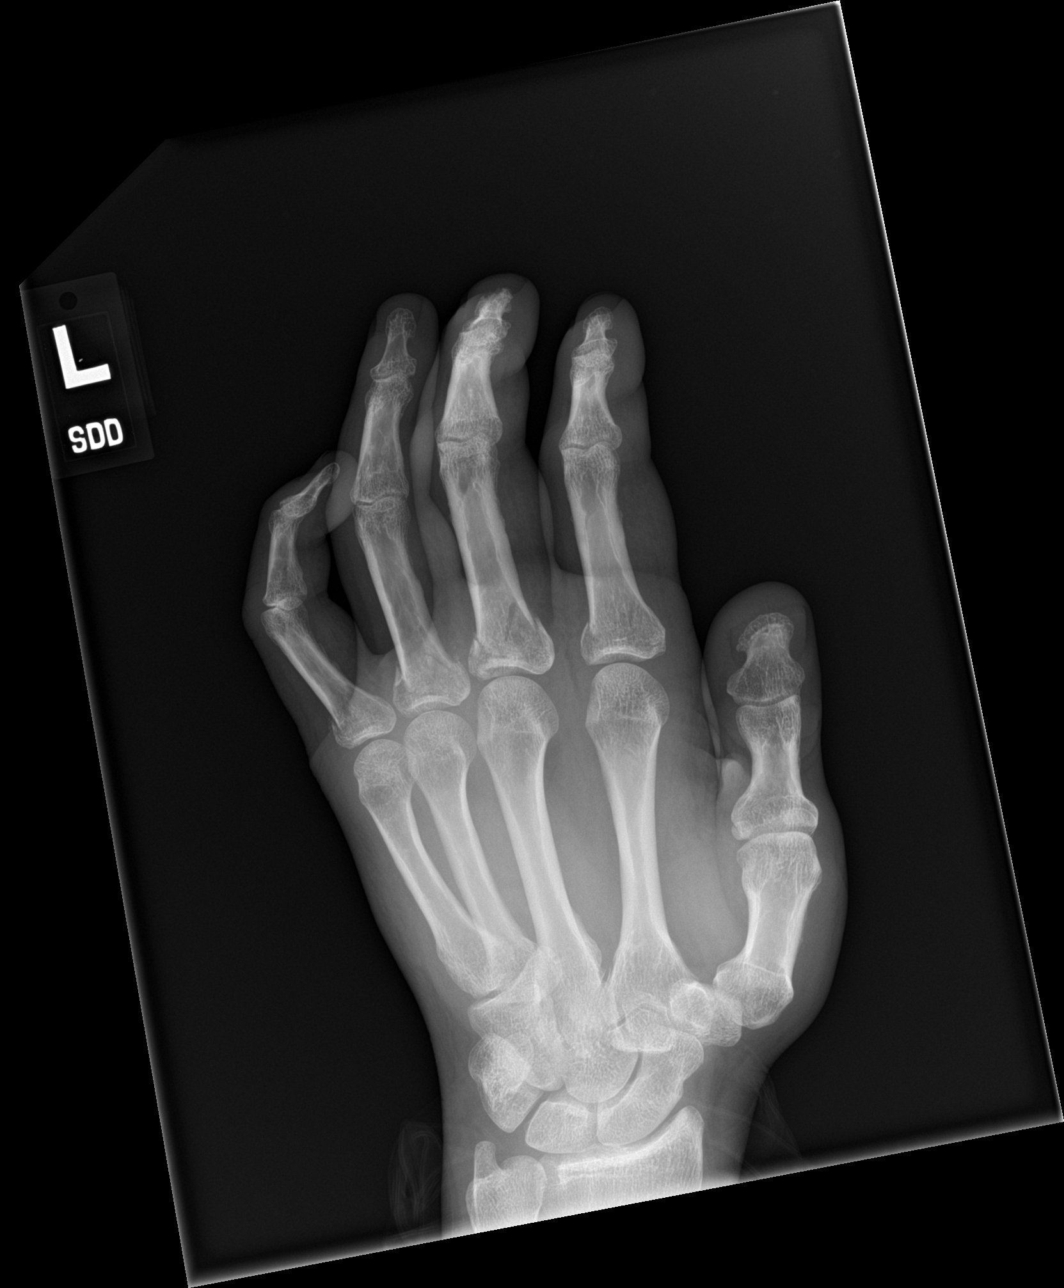

[hand lat (1 of 2)]
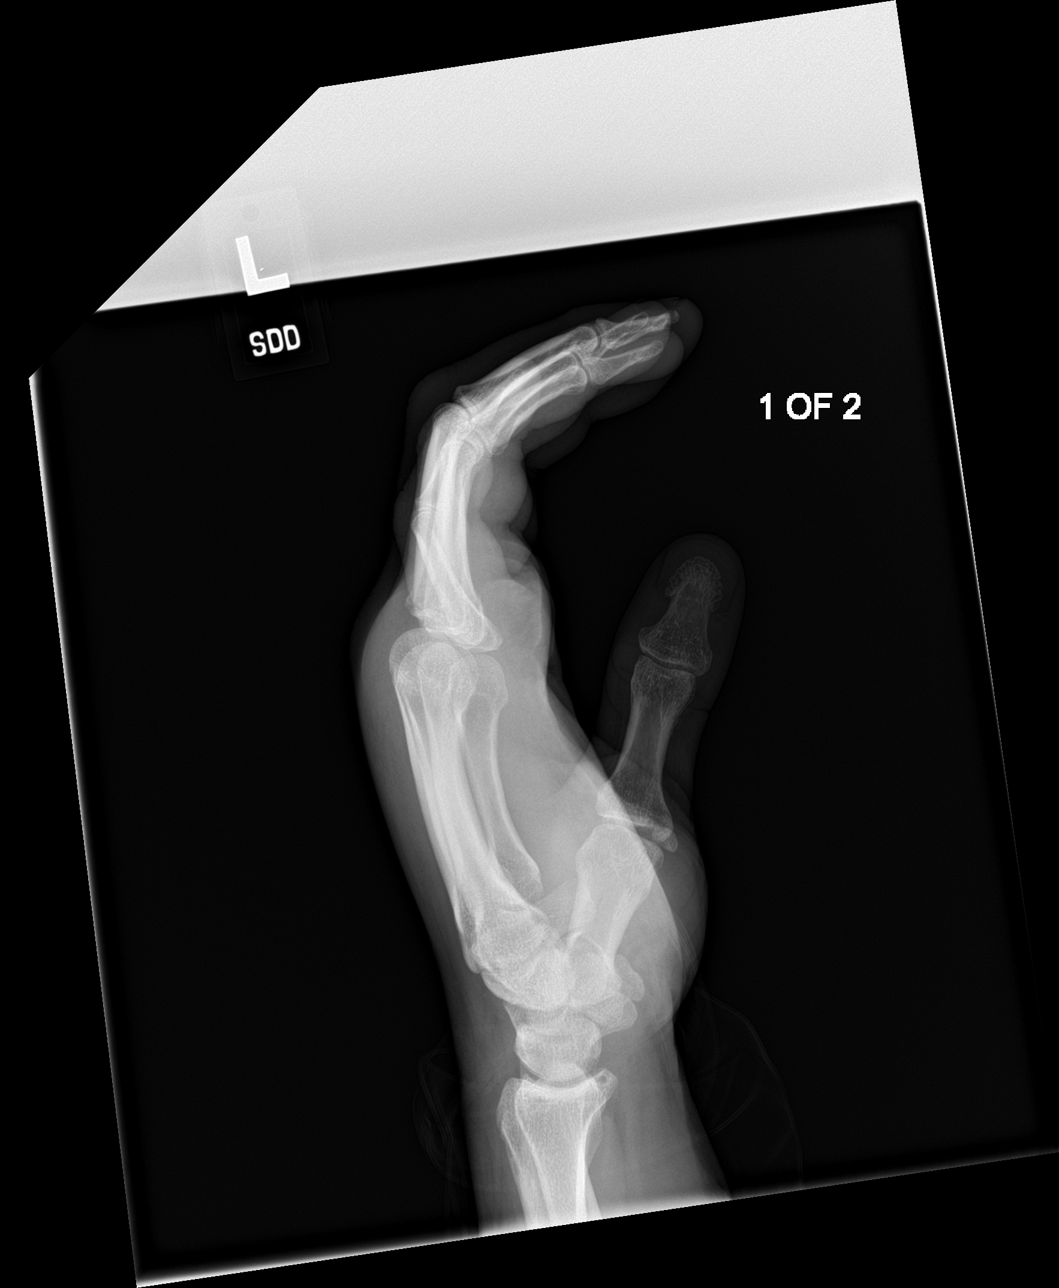

[hand lat (2 of 2)]
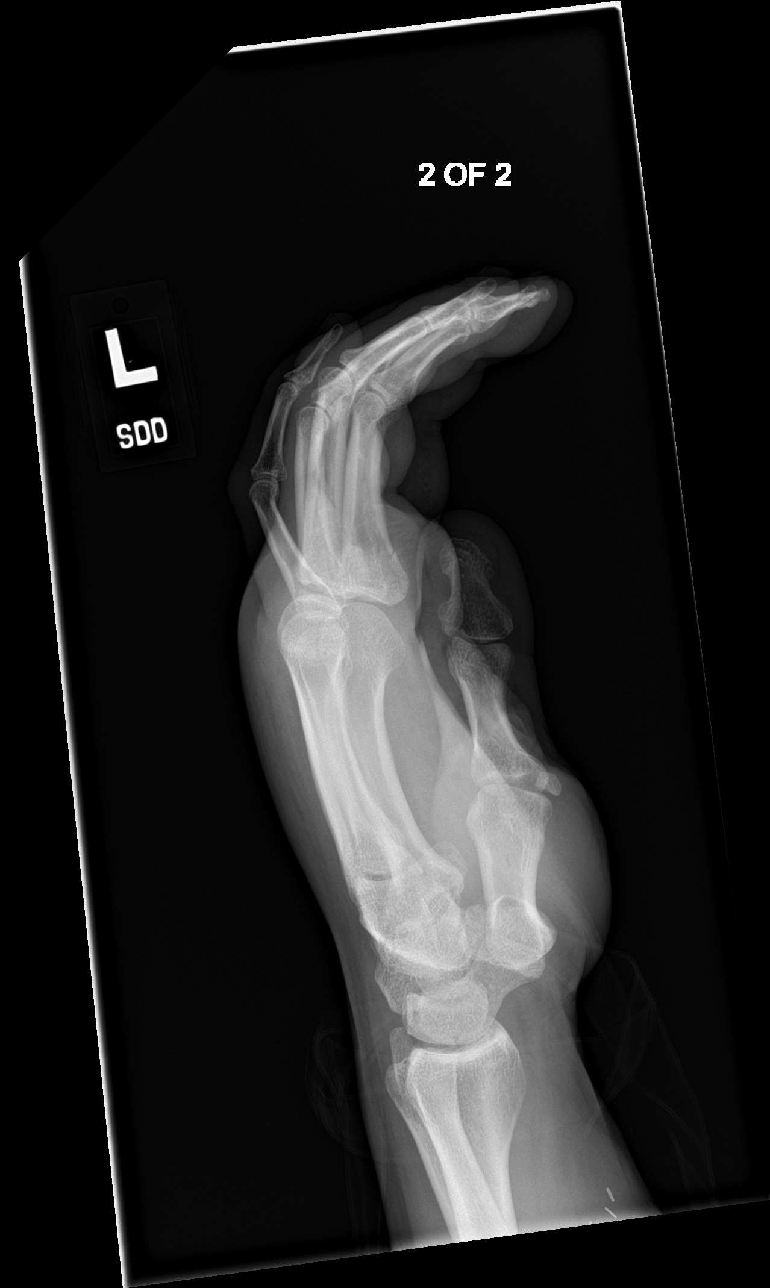

[4 of 4 positions shown; findings below may reference images not displayed]

FINDINGS: Acute fracture deformities are seen involving the bases of the
proximal phalanges of the second, third, fourth and fifth left
fingers. There is no evidence of dislocation. There is no evidence
of arthropathy or other focal bone abnormality. Mild dorsal soft
tissue swelling is seen.
IMPRESSION: Acute fractures of the proximal phalanges of the second, third,
fourth and fifth left fingers.

## 2022-08-05 IMAGING — CT CT MAXILLOFACIAL W/O CM
3 of 6 series · 15 of 47 positions shown, 18 images · non-contrast
Comparison: None.

CLINICAL DATA: Facial trauma, blunt; Neck trauma, dangerous injury
mechanism (Age 18-64y). Pt states he suffered a mechanical fall
yesterday after stepping in a pot hole. Pt reports left hand pain
and facial pain from the fall. Denies any LOC. ETOH



[Series 2: maxilllofacial 2.0 hr40 3 · axial · 0.33mm/px · z∈[-728,-588]mm · 9 of 82 slices shown, 12 images]
[im 6/82  brain]
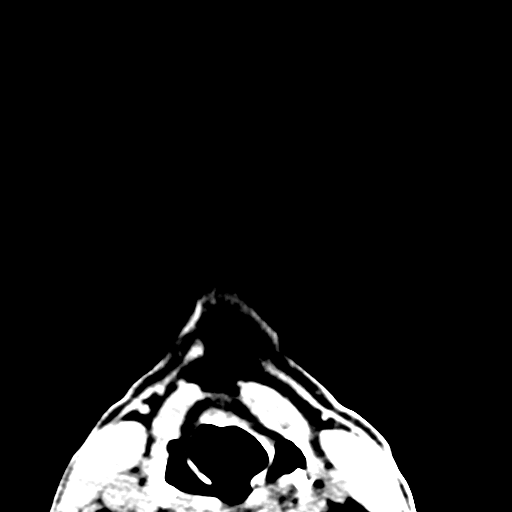
[im 6/82  bone]
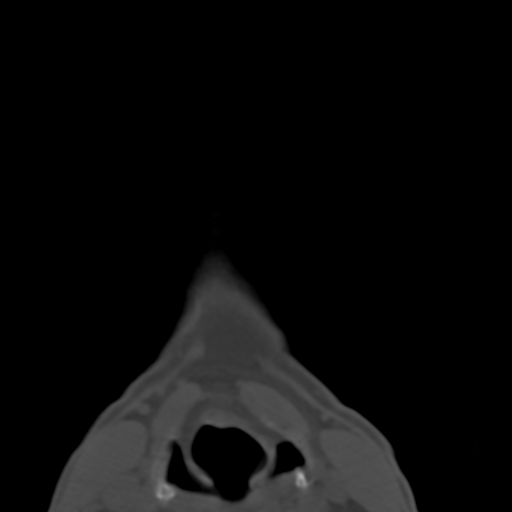
[im 18/82  bone]
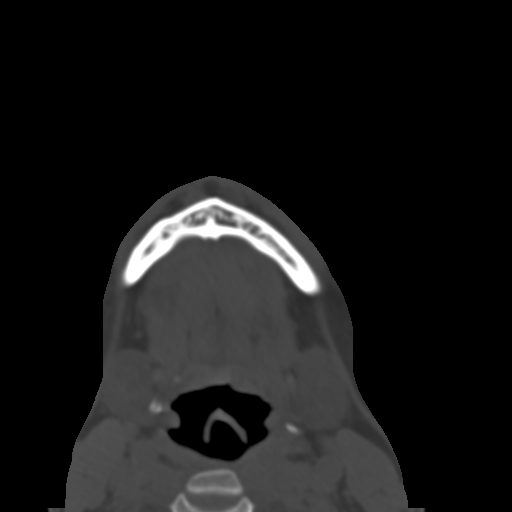
[im 24/82  bone]
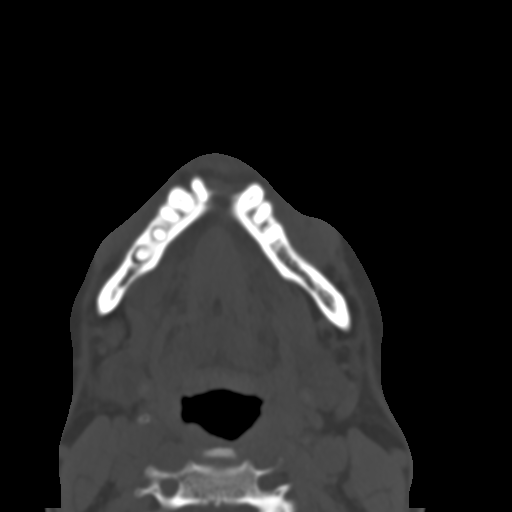
[im 35/82  bone]
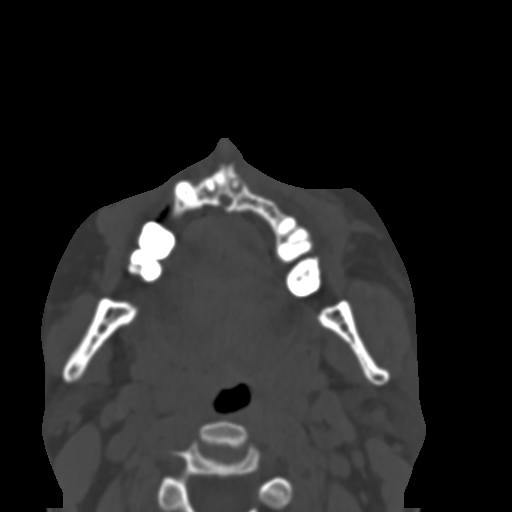
[im 41/82  brain]
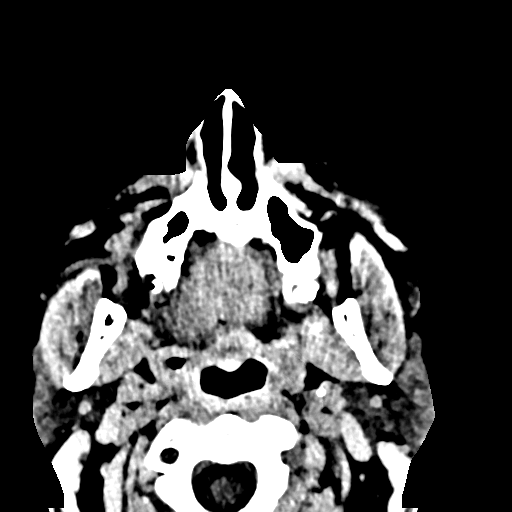
[im 41/82  bone]
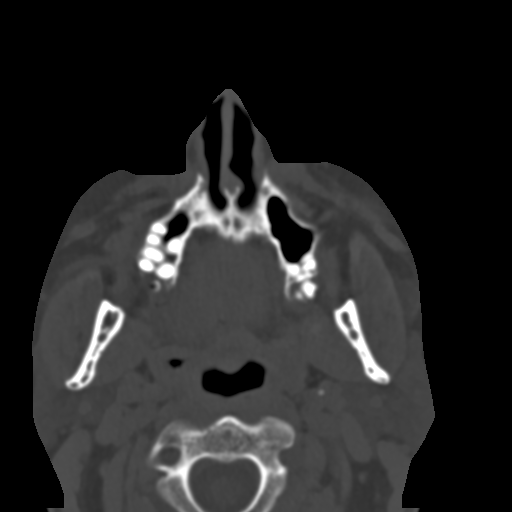
[im 47/82  bone]
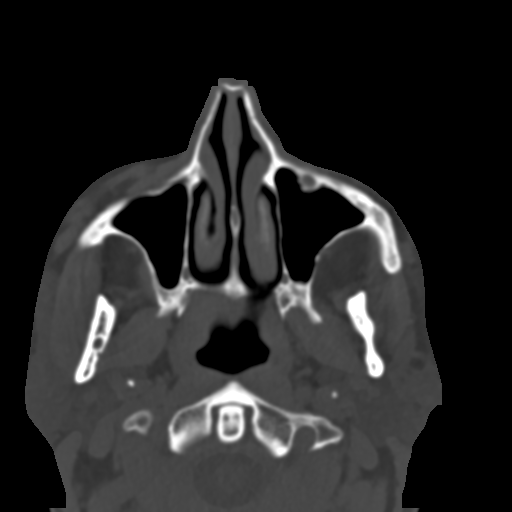
[im 58/82  bone]
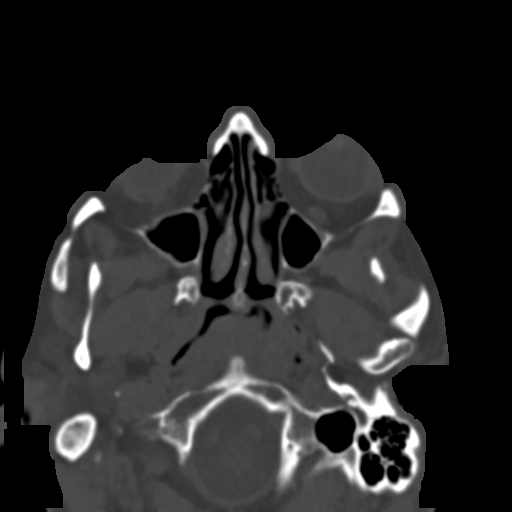
[im 64/82  bone]
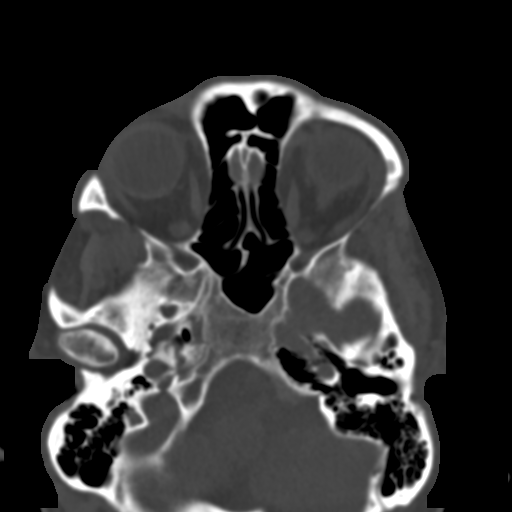
[im 76/82  brain]
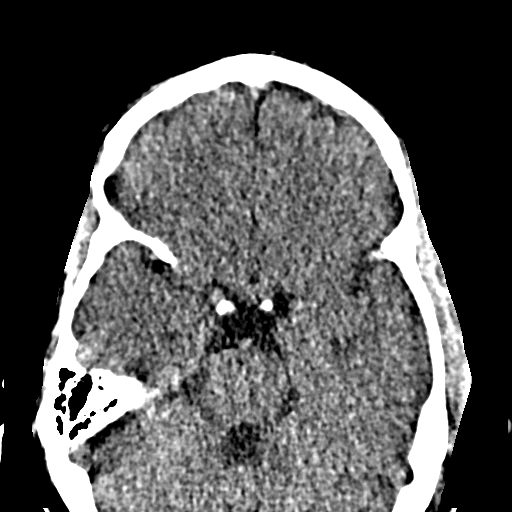
[im 76/82  bone]
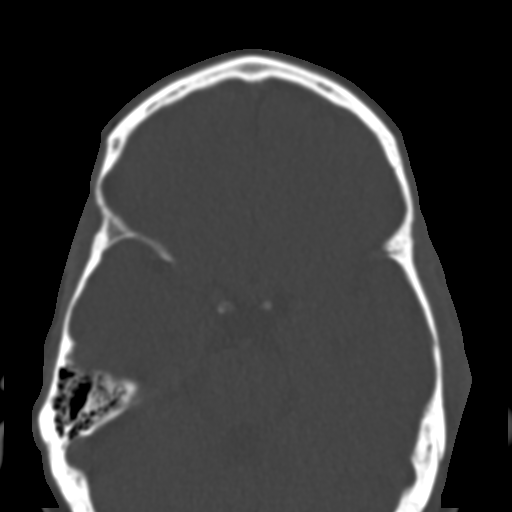

[Series 6: st cor · coronal · 0.35mm/px · 3 of 95 slices shown]
[im 24/95  bone]
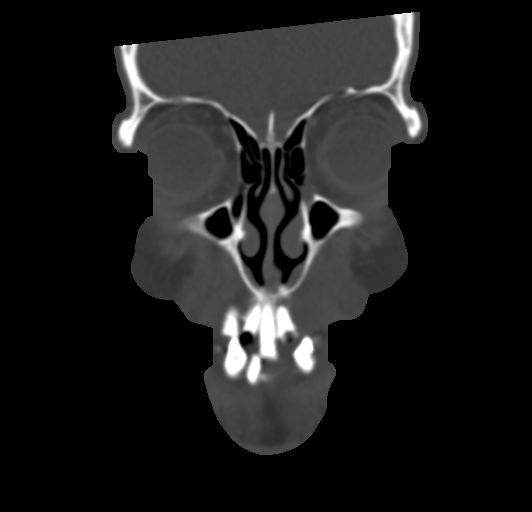
[im 48/95  bone]
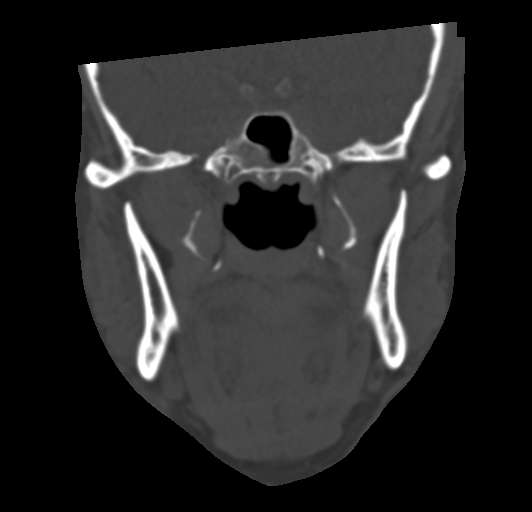
[im 71/95  bone]
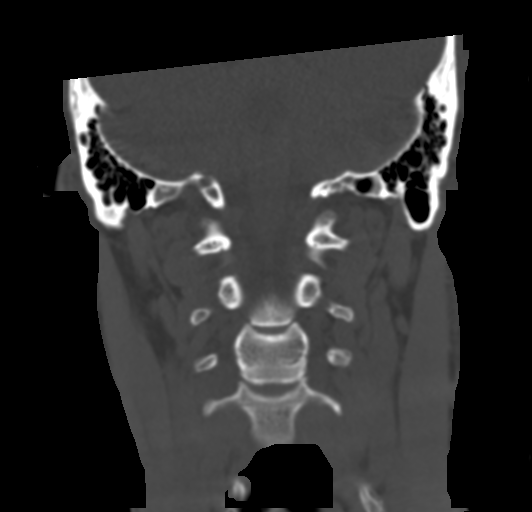

[Series 9: bone sag · sagittal · 0.35mm/px · 3 of 93 slices shown]
[im 3/93  bone]
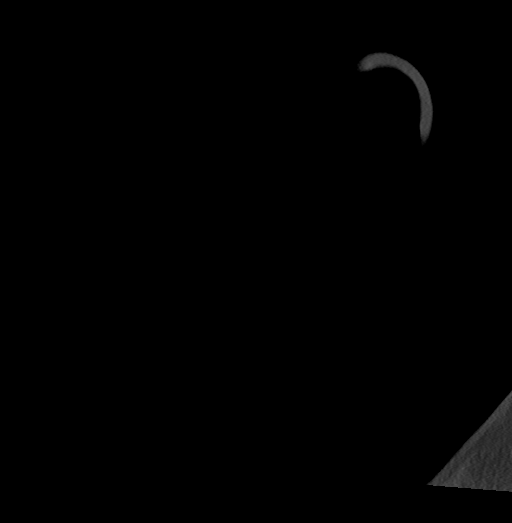
[im 32/93  bone]
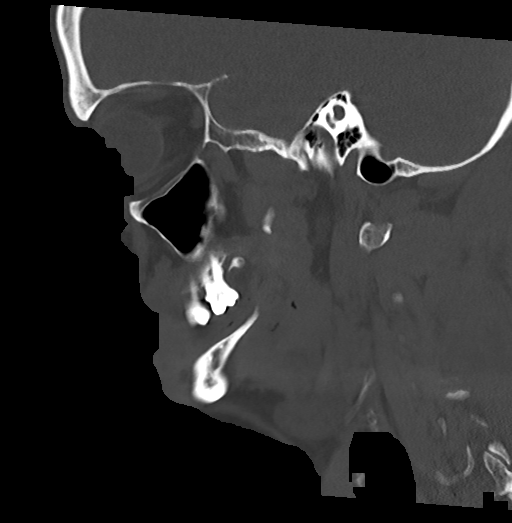
[im 61/93  bone]
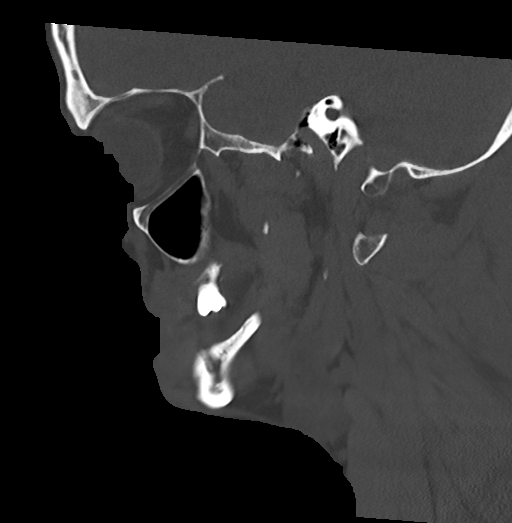

[15 of 47 positions shown; findings below may reference images not displayed]

FINDINGS: CT HEAD FINDINGS

Brain:

No evidence of large-territorial acute infarction. No parenchymal
hemorrhage. No mass lesion. No extra-axial collection.

No mass effect or midline shift. No hydrocephalus. Basilar cisterns
are patent.

Vascular: No hyperdense vessel.

Skull: No acute fracture or focal lesion.

Other: None.

CT MAXILLOFACIAL FINDINGS

Osseous: Age-indeterminate, likely chronic nondisplaced right nasal
bone fracture ([DATE]). No fracture or mandibular dislocation. No
destructive process. Periapical lucency surrounding several right
mandibular teeth. Erosive changes of couple of right mandibular
teeth ([DATE]), left mandibular, and maxillary teeth consistent
with caries.

Sinuses/Orbits: Paranasal sinuses and mastoid air cells are clear.
The orbits are unremarkable.

Soft tissues: Negative.

CT CERVICAL SPINE FINDINGS

Alignment: Normal.

Skull base and vertebrae: Mild C4-C5 degenerative changes. No acute
fracture. No aggressive appearing focal osseous lesion or focal
pathologic process.

Soft tissues and spinal canal: No prevertebral fluid or swelling. No
visible canal hematoma.

Upper chest: Unremarkable.

Other: None.
IMPRESSION: 1. No acute intracranial abnormality.
2. No acute displaced facial fracture. Age-indeterminate, likely
chronic nondisplaced right nasal bone fracture.
3. No acute displaced fracture or traumatic listhesis of the
cervical spine.
4. Periapical lucency surrounding several right mandibular teeth.
Correlate with physical exam for loosening in the setting of trauma
versus infection.
5. Multiple maxillary and mandibular dental caries.

## 2022-08-05 IMAGING — CT CT HEAD W/O CM
4 series · 16 of 47 positions shown, 18 images · non-contrast
Comparison: None.

CLINICAL DATA: Facial trauma, blunt; Neck trauma, dangerous injury
mechanism (Age 18-64y). Pt states he suffered a mechanical fall
yesterday after stepping in a pot hole. Pt reports left hand pain
and facial pain from the fall. Denies any LOC. ETOH



[Series 2: head wo · axial · 0.46mm/px · z∈[-619,-494]mm · 7 of 35 slices shown, 9 images]
[im 5/35  brain]
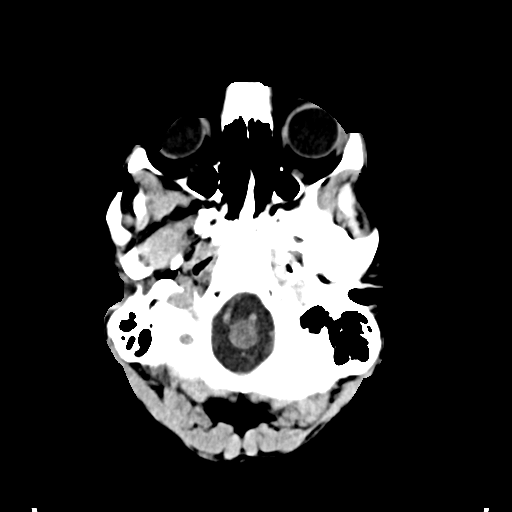
[im 5/35  bone]
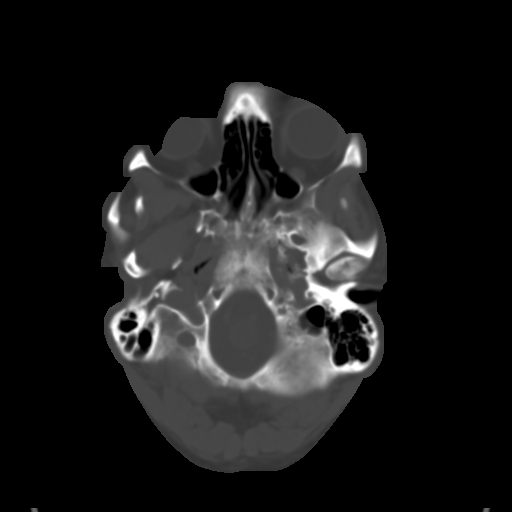
[im 9/35  brain]
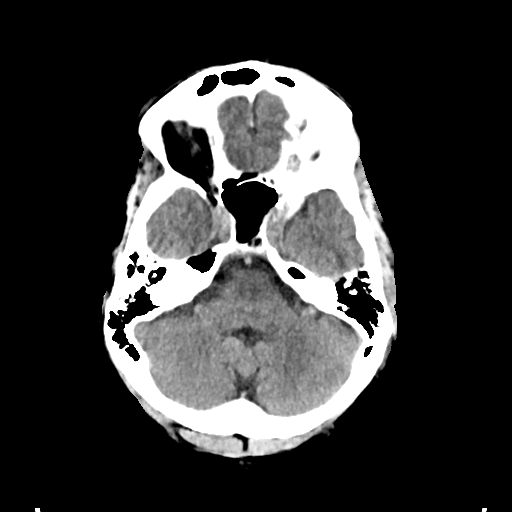
[im 13/35  brain]
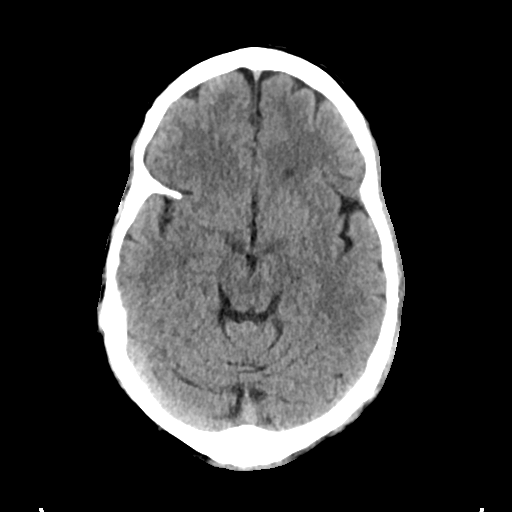
[im 18/35  brain]
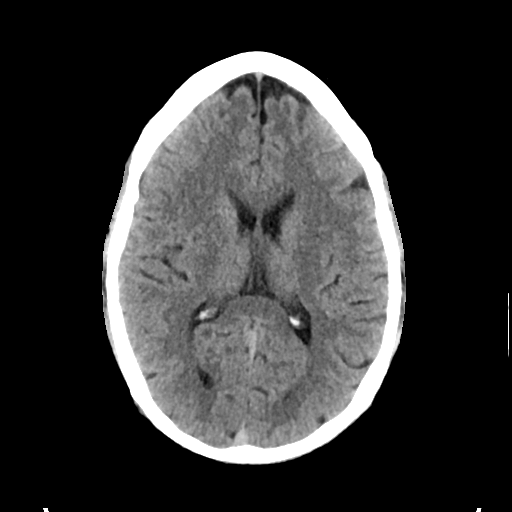
[im 22/35  brain]
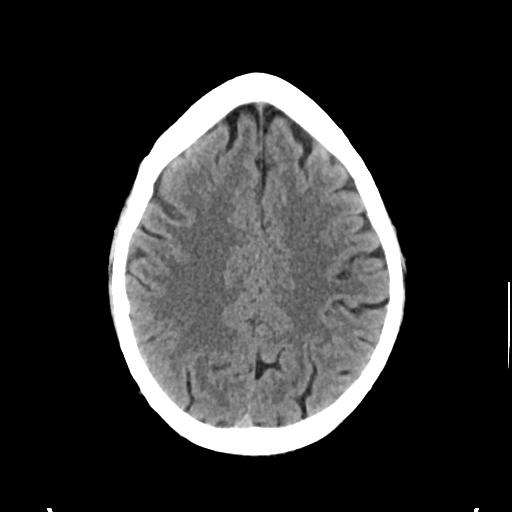
[im 22/35  bone]
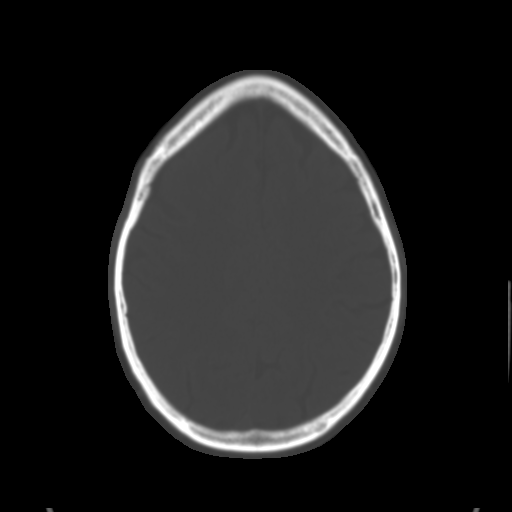
[im 26/35  brain]
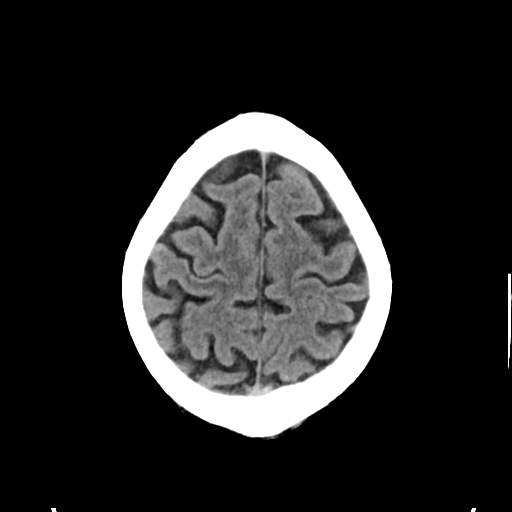
[im 30/35  brain]
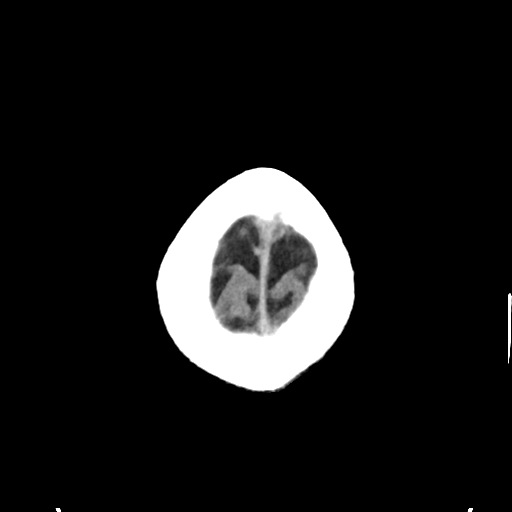

[Series 3: head bone · axial · 0.46mm/px · z∈[-623,-589]mm · 3 of 86 slices shown]
[im 9/86  bone]
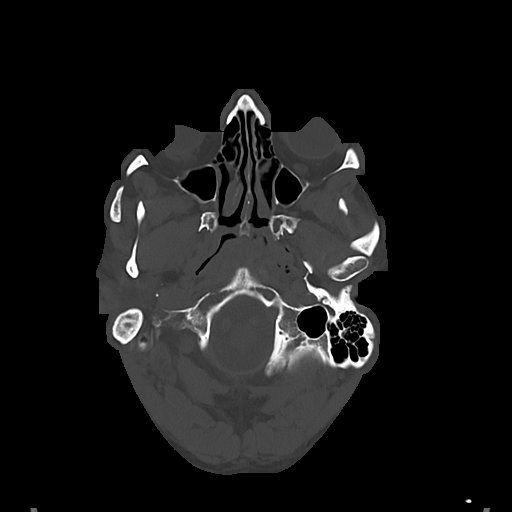
[im 18/86  bone]
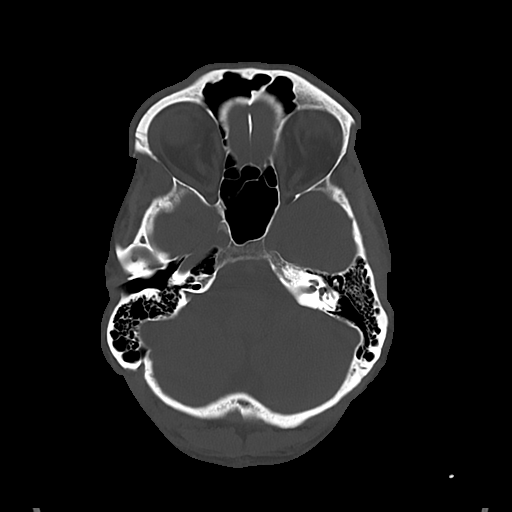
[im 26/86  bone]
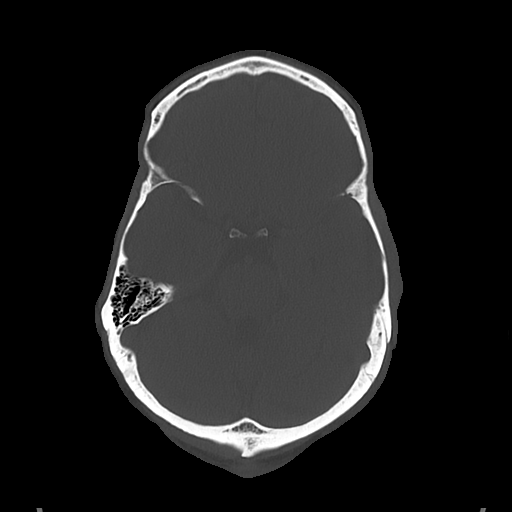

[Series 4: cor soft · coronal · 0.37mm/px · 3 of 74 slices shown]
[im 25/74  brain]
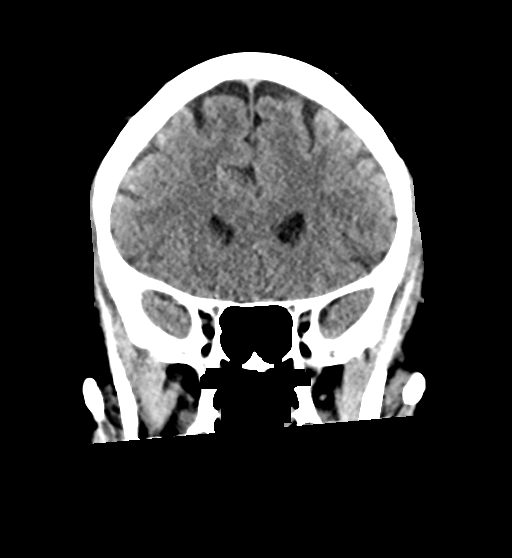
[im 33/74  brain]
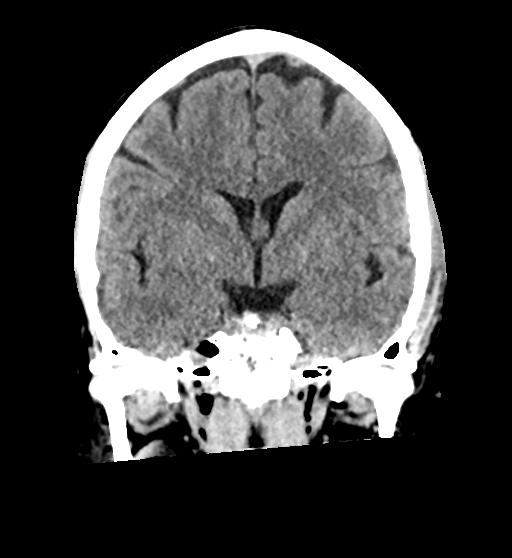
[im 41/74  brain]
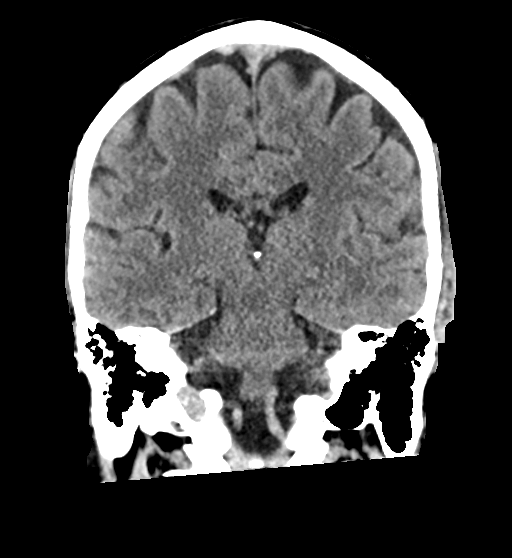

[Series 5: sag soft · sagittal · 0.40mm/px · 3 of 60 slices shown]
[im 20/60  brain]
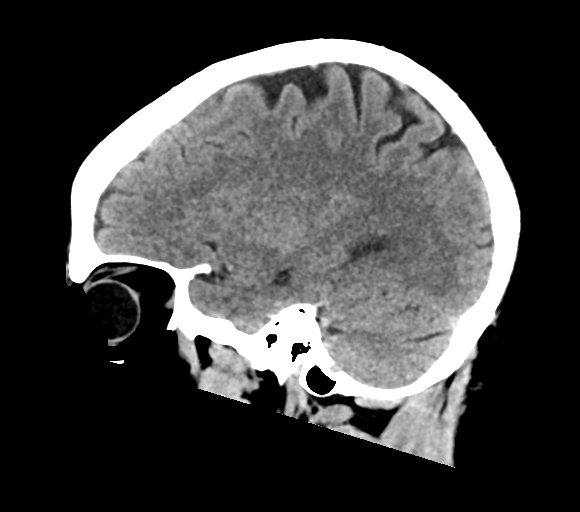
[im 30/60  brain]
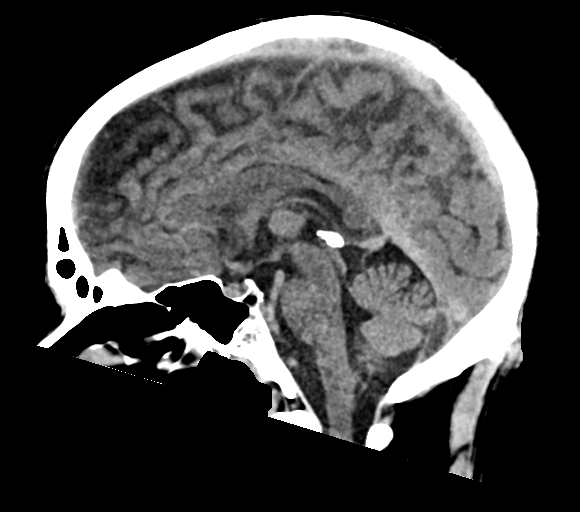
[im 40/60  brain]
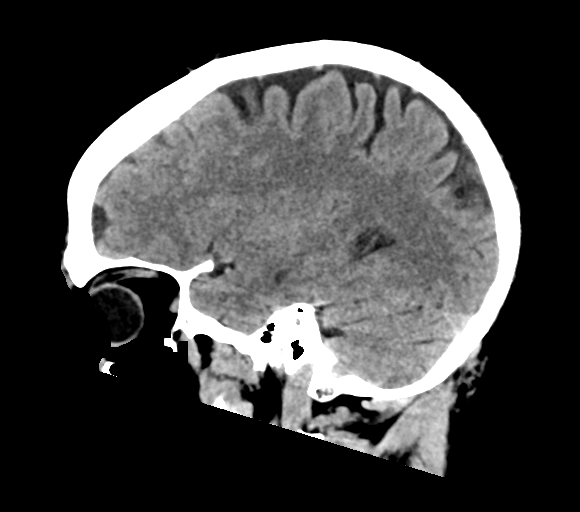

[16 of 47 positions shown; findings below may reference images not displayed]

FINDINGS: CT HEAD FINDINGS

Brain:

No evidence of large-territorial acute infarction. No parenchymal
hemorrhage. No mass lesion. No extra-axial collection.

No mass effect or midline shift. No hydrocephalus. Basilar cisterns
are patent.

Vascular: No hyperdense vessel.

Skull: No acute fracture or focal lesion.

Other: None.

CT MAXILLOFACIAL FINDINGS

Osseous: Age-indeterminate, likely chronic nondisplaced right nasal
bone fracture ([DATE]). No fracture or mandibular dislocation. No
destructive process. Periapical lucency surrounding several right
mandibular teeth. Erosive changes of couple of right mandibular
teeth ([DATE]), left mandibular, and maxillary teeth consistent
with caries.

Sinuses/Orbits: Paranasal sinuses and mastoid air cells are clear.
The orbits are unremarkable.

Soft tissues: Negative.

CT CERVICAL SPINE FINDINGS

Alignment: Normal.

Skull base and vertebrae: Mild C4-C5 degenerative changes. No acute
fracture. No aggressive appearing focal osseous lesion or focal
pathologic process.

Soft tissues and spinal canal: No prevertebral fluid or swelling. No
visible canal hematoma.

Upper chest: Unremarkable.

Other: None.
IMPRESSION: 1. No acute intracranial abnormality.
2. No acute displaced facial fracture. Age-indeterminate, likely
chronic nondisplaced right nasal bone fracture.
3. No acute displaced fracture or traumatic listhesis of the
cervical spine.
4. Periapical lucency surrounding several right mandibular teeth.
Correlate with physical exam for loosening in the setting of trauma
versus infection.
5. Multiple maxillary and mandibular dental caries.

## 2022-08-28 NOTE — Congregational Nurse Program (Signed)
  Dept: [phone]   Congregational Nurse Program Note  Date of Encounter: 08/28/2022 Client to [facility] day center requesting assistance with a letter he received stating he will have insurance coverage effective 09/24/2022. [employer] through the affordable care act. RN to assist with finding a PCP that is with in this plan. Nail care provided, support given, no other needs at this time. Past Medical History: Past Medical History:  Diagnosis Date   Depression     Encounter Details:  CNP Questionnaire - 08/28/22 1213       Questionnaire   Ask client: Do you give verbal consent for me to treat you today? Yes    Student Assistance N/A    Location Patient Served  [facility]    Visit Setting with Client Organization    Patient Status Unhoused    Insurance Uninsured ([place] Card/Care Connects/Self-Pay/Medicaid Family Planning)   client may have insurance through the exchange   Insurance/Financial Assistance Referral N/A   client needed assistance with a letter he had received regarding insurance he will have starting 09/24/2022   Medication N/A   not currenlty taking any medications   Medical Provider No   client declines assistance with obtaining PCP/[facility]. Client continues to decline being seen at the [facility]   Screening Referrals Made N/A    Medical Referrals Made N/A    Medical Appointment Made N/A    Recently w/o PCP, now 1st time PCP visit completed due to CNs referral or appointment made N/A   client needed assistance with finding a participating PCP with in his BC/BS plan. effective 09/24/2022   Food Have Food Insecurities   client aware of food pantries in the [employer] N/A   client continues to use the [employer] bus system   Housing/Utilities No permanent housing    Economist N/A    Interventions Advocate/Support;Educate;Navigate Healthcare System    Abnormal to Normal Screening Since Last CN Visit N/A    Screenings CN  Performed N/A    Sent Client to Lab for: N/A    Did client attend any of the following based off CNs referral or appointments made? N/A    ED Visit Averted N/A    Life-Saving Intervention Made N/A

## 2022-10-28 ENCOUNTER — Emergency Department
Admission: EM | Admit: 2022-10-28 | Discharge: 2022-10-29 | Disposition: A | Discharge status: Home / Self Care | Payer: Self-pay | Attending: Emergency Medicine | Admitting: Emergency Medicine

## 2022-10-28 DIAGNOSIS — Y908 Blood alcohol level of 240 mg/100 ml or more: Secondary | ICD-10-CM | POA: Insufficient documentation

## 2022-10-28 DIAGNOSIS — Z5900 Homelessness unspecified: Secondary | ICD-10-CM | POA: Insufficient documentation

## 2022-10-28 DIAGNOSIS — F10920 Alcohol use, unspecified with intoxication, uncomplicated: Secondary | ICD-10-CM

## 2022-10-28 DIAGNOSIS — F10129 Alcohol abuse with intoxication, unspecified: Secondary | ICD-10-CM | POA: Insufficient documentation

## 2022-10-28 LAB — CBC WITH DIFFERENTIAL/PLATELET
Abs Immature Granulocytes: 0.02 10*3/uL (ref 0.00–0.07)
Basophils Absolute: 0.1 10*3/uL (ref 0.0–0.1)
Basophils Relative: 1 %
Eosinophils Absolute: 0.1 10*3/uL (ref 0.0–0.5)
Eosinophils Relative: 1 %
HCT: 42.2 % (ref 39.0–52.0)
Hemoglobin: 14.8 g/dL (ref 13.0–17.0)
Immature Granulocytes: 0 %
Lymphocytes Relative: 29 %
Lymphs Abs: 2.3 10*3/uL (ref 0.7–4.0)
MCH: 32.5 pg (ref 26.0–34.0)
MCHC: 35.1 g/dL (ref 30.0–36.0)
MCV: 92.7 fL (ref 80.0–100.0)
Monocytes Absolute: 0.8 10*3/uL (ref 0.1–1.0)
Monocytes Relative: 10 %
Neutro Abs: 4.7 10*3/uL (ref 1.7–7.7)
Neutrophils Relative %: 59 %
Platelets: 314 10*3/uL (ref 150–400)
RBC: 4.55 MIL/uL (ref 4.22–5.81)
RDW: 12.1 % (ref 11.5–15.5)
WBC: 8 10*3/uL (ref 4.0–10.5)
nRBC: 0 % (ref 0.0–0.2)

## 2022-10-28 LAB — TROPONIN I (HIGH SENSITIVITY)
Troponin I (High Sensitivity): 5 ng/L
Troponin I (High Sensitivity): 5 ng/L

## 2022-10-28 LAB — SALICYLATE LEVEL: Salicylate Lvl: <7 mg/dL — ABNORMAL LOW (ref 7.0–30.0)

## 2022-10-28 LAB — ETHANOL: Alcohol, Ethyl (B): 276 mg/dL — ABNORMAL HIGH

## 2022-10-28 LAB — ACETAMINOPHEN LEVEL: Acetaminophen (Tylenol), Serum: <10 ug/mL — ABNORMAL LOW (ref 10–30)

## 2022-10-29 LAB — COMPREHENSIVE METABOLIC PANEL
ALT: 27 U/L (ref 0–44)
AST: 30 U/L (ref 15–41)
Albumin: 4.4 g/dL (ref 3.5–5.0)
Alkaline Phosphatase: 56 U/L (ref 38–126)
Anion gap: 13 (ref 5–15)
BUN: 13 mg/dL (ref 6–20)
CO2: 22 mmol/L (ref 22–32)
Calcium: 9 mg/dL (ref 8.9–10.3)
Chloride: 105 mmol/L (ref 98–111)
Creatinine, Ser: 0.96 mg/dL (ref 0.61–1.24)
GFR, Estimated: 53 mL/min — ABNORMAL LOW (ref >60)
Glucose, Bld: 93 mg/dL (ref 70–99)
Potassium: 3.6 mmol/L (ref 3.5–5.1)
Sodium: 140 mmol/L (ref 135–145)
Total Bilirubin: 0.8 mg/dL (ref 0.3–1.2)
Total Protein: 7.7 g/dL (ref 6.5–8.1)

## 2022-10-29 NOTE — ED Notes (Signed)
Able to ambulate safely, eat and drink without any problems.Verified correct patient and correct discharge papers given. Pt alert and oriented X 4, stable for discharge. RR even and unlabored, color WNL. Discussed discharge instructions and follow-up as directed. Discharge medications discussed, when prescribed. Pt had opportunity to ask questions, and RN available to provide patient and/or family education. Left with all of belongings. Leaving via [employer], provided voucher as pt states he is unabe to afford toll.

## 2022-11-07 NOTE — Congregational Nurse Program (Signed)
  Dept: [phone]   Congregational Nurse Program Note  Date of Encounter: 11/07/2022 Client to [facility] day center with request for nail care. Finger nails trimmed by client with RN's clippers. No other needs at this time.Support given. Past Medical History: Past Medical History:  Diagnosis Date   Depression     Encounter Details:  CNP Questionnaire - 11/07/22 1110       Questionnaire   Ask client: Do you give verbal consent for me to treat you today? Yes    Student Assistance N/A    Location Patient [place]    Visit Setting with Client Organization    Patient Status Unhoused    Insurance Uninsured ([place] Card/Care Connects/Self-Pay/Medicaid Family Planning)   client may have insurance through the exchange   Insurance/Financial Assistance Referral N/A   client needed assistance with a letter he had received regarding insurance he will have starting 09/24/2022   Medication N/A   not currenlty taking any medications   Medical Provider No   client declines assistance with obtaining PCP/[facility]. Client continues to decline being seen at the [facility]   Screening Referrals Made N/A    Medical Referrals Made N/A    Medical Appointment Made N/A    Recently w/o PCP, now 1st time PCP visit completed due to CNs referral or appointment made N/A   client needed assistance with finding a participating PCP with in his BC/BS plan. effective 09/24/2022   Food Have Food Insecurities   client aware of food pantries in the [employer] N/A   client continues to use the [employer] bus system   Housing/Utilities No permanent housing    Chiropractor N/A    Interventions Advocate/Support    Abnormal to Normal Screening Since Last CN Visit N/A    Screenings CN Performed N/A    Sent Client to Lab for: N/A    Did client attend any of the following based off CNs referral or appointments made? N/A    ED Visit Averted N/A    Life-Saving Intervention Made N/A

## 2023-02-19 NOTE — Congregational Nurse Program (Signed)
  Dept: [phone]   Congregational Nurse Program Note  Date of Encounter: 02/19/2023 Client to [facility] day center with need of first aid to a cut on his left fore finger. He reports it may be from scrap metal, no knowledge of last tetanus vaccination. Cut does not appear deep, edges are approximated. Area cleaned with soap and water and wound wash, antibiotic ointment applied, covered with non-adhesive dressing. Small scrape to the left side of his face near his check bone, cleansed with wound wash, antibiotic ointment applied and covered with non-adhesive dressing. Educated client on the importance of a tetanus vaccination. RN encouraged client to return to the center tomorrow for continued monitoring. Client voiced understanding. Francesco Runner BSN, RN Past Medical History: Past Medical History:  Diagnosis Date   Depression     Encounter Details:  CNP Questionnaire - 02/19/23 1230       Questionnaire   Ask client: Do you give verbal consent for me to treat you today? Yes    Student Assistance N/A    Location Patient Served  [facility]    Visit Setting with Client Organization    Patient Status Unhoused    Insurance Uninsured ([place] Card/Care Connects/Self-Pay/Medicaid [employer])   client may have insurance through the exchange, he has multiple cards, unclear as to which is in effect   Insurance/Financial Assistance Referral N/A   client needed assistance with a letter he had received regarding insurance he will have starting 09/24/2022   Medication N/A   not currenlty taking any medications   Medical Provider No   client declines assistance with obtaining PCP/[facility]. Client continues to decline being seen at the [facility]   Screening Referrals Made N/A    Medical Referrals Made N/A    Medical Appointment Made N/A    Recently w/o PCP, now 1st time PCP visit completed due to CNs referral or appointment made N/A   client needed assistance with finding a  participating PCP with in his BC/BS plan. effective 09/24/2022   Food Have Food Insecurities   client aware of food pantries in the [employer] N/A   client continues to use the [employer] bus system   Housing/Utilities No permanent housing    Economist N/A    Interventions Advocate/Support    Abnormal to Normal Screening Since Last CN Visit N/A    Screenings CN Performed N/A    Sent Client to Lab for: N/A    Did client attend any of the following based off CNs referral or appointments made? N/A    ED Visit Averted N/A    Life-Saving Intervention Made N/A

## 2023-03-06 NOTE — Congregational Nurse Program (Signed)
  Dept: [phone]   Congregational Nurse Program Note  Date of Encounter: 03/06/2023 Client to [facility] day center with complaints of left heel pain when walking, some throbbing when elevated. He denied any injury. Discussed possible plantar fascitis. During the visit RN was able to confirm that client is uninsured. Medicaid application completed. Rn to take to [facility] today. Rn requested that client share information when received regarding his approval/denial for Medicaid. RN can then assist with a podiatry apt. Client agreeable. Client requested a crutch to help take pressure off his foot when he walks. One given. Madolyn Frieze Past Medical History: Past Medical History:  Diagnosis Date   Depression     Encounter Details:  CNP Questionnaire - 03/06/23 1200       Questionnaire   Ask client: Do you give verbal consent for me to treat you today? Yes    Student Assistance N/A    Location Patient Served  [facility]    Visit Setting with Client Organization    Patient Status Unhoused    Insurance Uninsured ([place] [employer]s/Self-Pay/Medicaid Family Planning)   client does not have curent Medicaid, verified with [place] financial navigator   Insurance/Financial Assistance Referral Medicaid   Medicaid application completed at visit, RN to take to [facility]   Medication N/A   not currenlty taking any medications   Medical Provider No   client declines assistance with obtaining PCP/[facility]. Client continues to decline being seen at [facility]   Screening Referrals Made N/A    Medical Referrals Made N/A    Medical Appointment Made N/A    Recently w/o PCP, now 1st time PCP visit completed due to CNs referral or appointment made N/A   client needed assistance with finding a participating PCP with in his BC/BS plan. effective 09/24/2022   Food Have Food Insecurities   does have food stamps   Transportation N/A   client continues to use the  [place] bus system   Housing/Utilities No permanent housing    Interpersonal Safety N/A    Interventions Advocate/Support;Case Management   Medicaid application completed 6/11   Abnormal to Normal Screening Since Last CN Visit N/A    Screenings CN Performed N/A    Sent Client to Lab for: N/A    Did client attend any of the following based off CNs referral or appointments made? N/A    ED Visit Averted N/A    Life-Saving Intervention Made N/A

## 2023-03-19 NOTE — Congregational Nurse Program (Signed)
  Dept: [phone]   Congregational Nurse Program Note  Date of Encounter: 03/19/2023 Client to [facility] day center with information regarding he had received about his Medicaid. He was approved and was assigned to [facility] RN was able to make him a new patient apt at [facility] with Della Goo FNP. Apt is Monday 7/15 at 1:20 pm. RN to assist with transportation either thru his insurance or [place] bus pass.Francesco Runner BSN, RN Past Medical History: Past Medical History:  Diagnosis Date   Depression     Encounter Details:  CNP Questionnaire - 03/19/23 1227       Questionnaire   Ask client: Do you give verbal consent for me to treat you today? Yes    Student Assistance N/A    Location Patient Served  [facility]    Visit Setting with Client Organization    Patient Status Unhoused    Insurance Medicaid   Mediciad has been approved. [employer] care, still needs his card   Insurance/Financial Assistance Referral N/A   Medicaid has been approved   Medication N/A   not currenlty taking any medications   Medical Provider No   apt made at [facility]   Screening Referrals Made N/A    Medical Referrals Made [facility]   [facility]   Medical Appointment Made [facility]   [facility] with Della Goo FNP 7/15 @ 1:20 pm   Recently w/o PCP, now 1st time PCP visit completed due to CNs referral or appointment made N/A   client needed assistance with finding a participating PCP with in his BC/BS plan. effective 09/24/2022   Food Have Food Insecurities   does have food stamps   Transportation N/A   client continues to use the [place] bus system, may need assistance with bus passes for Md apt   Housing/Utilities No permanent housing    Economist N/A    Interventions Advocate/Support;Case Management;[facility]   PCP apt made   Abnormal to Normal Screening Since Last CN Visit N/A    Screenings CN Performed N/A     Sent Client to Lab for: N/A    Did client attend any of the following based off CNs referral or appointments made? N/A    ED Visit Averted N/A    Life-Saving Intervention Made N/A

## 2023-04-08 ENCOUNTER — Ambulatory Visit: Payer: Medicaid Other | Admitting: Nurse Practitioner

## 2023-04-08 NOTE — Progress Notes (Deleted)
   There were no vitals taken for this visit.   Subjective:    Patient ID: Fabio Neighbors., male    DOB: 1976/04/05, 47 y.o.   MRN: [id number]  HPI: Aydan Levitz. is a 47 y.o. male  No chief complaint on file.  Establish care: his last physical was ***.  Medical history includes ***.  Family history includes ***.  Health Maintenance ***.   Relevant past medical, surgical, family and social history reviewed and updated as indicated. Interim medical history since our last visit reviewed. Allergies and medications reviewed and updated.  Review of Systems  Constitutional: Negative for fever or weight change.  Respiratory: Negative for cough and shortness of breath.   Cardiovascular: Negative for chest pain or palpitations.  Gastrointestinal: Negative for abdominal pain, no bowel changes.  Musculoskeletal: Negative for gait problem or joint swelling.  Skin: Negative for rash.  Neurological: Negative for dizziness or headache.  No other specific complaints in a complete review of systems (except as listed in HPI above).      Objective:    There were no vitals taken for this visit.  Wt Readings from Last 3 Encounters:  06/23/22 190 lb (86.2 kg)  06/02/22 264 lb 15.9 oz (120.2 kg)  04/27/22 265 lb (120.2 kg)    Physical Exam  Constitutional: Patient appears well-developed and well-nourished. Obese *** No distress.  HEENT: head atraumatic, normocephalic, pupils equal and reactive to light, ears ***, neck supple, throat within normal limits Cardiovascular: Normal rate, regular rhythm and normal heart sounds.  No murmur heard. No BLE edema. Pulmonary/Chest: Effort normal and breath sounds normal. No respiratory distress. Abdominal: Soft.  There is no tenderness. Psychiatric: Patient has a normal mood and affect. behavior is normal. Judgment and thought content normal.      Assessment & Plan:   Problem List Items Addressed This Visit   None    Follow up plan: No  follow-ups on file.

## 2023-05-15 NOTE — Congregational Nurse Program (Signed)
  Dept: [phone]   Congregational Nurse Program Note  Date of Encounter: 05/15/2023 Client to [facility] day center with request for nail care. Client given nail clippers and client able to perform his own care. No other needs at this time. Francesco Runner BSN, RN Past Medical History: Past Medical History:  Diagnosis Date   Depression     Encounter Details:  CNP Questionnaire - 05/15/23 1059       Questionnaire   Ask client: Do you give verbal consent for me to treat you today? Yes    Student Assistance N/A    Location Patient Served  [facility]    Visit Setting with Client Organization    Patient Status Unhoused    Insurance Medicaid   Mediciad has been approved. [employer] care, still needs his card   Insurance/Financial Assistance Referral N/A   Medicaid has been approved   Medication N/A   not currenlty taking any medications   Medical Provider No   apt made at [facility]   Screening Referrals Made N/A    Medical Referrals Made N/A    Medical Appointment Made N/A   [facility] with Della Goo FNP 7/15 @ 1:20 pm, client did NOT attaned this appointment   Recently w/o PCP, now 1st time PCP visit completed due to CNs referral or appointment made N/A   client needed assistance with finding a participating PCP with in his BC/BS plan. effective 09/24/2022   Food N/A   does have food stamps and gets food from food Programme researcher, broadcasting/film/video N/A   client continues to use the [place] bus system, may need assistance with bus passes for Md apt   Housing/Utilities No permanent housing    Interpersonal Safety N/A    Interventions Advocate/Support   PCP apt made   Abnormal to Normal Screening Since Last CN Visit N/A    Screenings CN Performed N/A    Sent Client to Lab for: N/A    Did client attend any of the following based off CNs referral or appointments made? N/A    ED Visit Averted N/A    Life-Saving Intervention Made N/A

## 2024-02-16 ENCOUNTER — Encounter: Payer: Self-pay | Admitting: Emergency Medicine

## 2024-02-16 ENCOUNTER — Other Ambulatory Visit: Payer: Self-pay

## 2024-02-16 ENCOUNTER — Emergency Department
Admission: EM | Admit: 2024-02-16 | Discharge: 2024-02-16 | Disposition: A | Discharge status: Home / Self Care | Attending: Emergency Medicine | Admitting: Emergency Medicine

## 2024-02-16 DIAGNOSIS — F1010 Alcohol abuse, uncomplicated: Secondary | ICD-10-CM | POA: Diagnosis not present

## 2024-02-16 DIAGNOSIS — F17200 Nicotine dependence, unspecified, uncomplicated: Secondary | ICD-10-CM | POA: Diagnosis not present

## 2024-02-16 DIAGNOSIS — F1090 Alcohol use, unspecified, uncomplicated: Secondary | ICD-10-CM

## 2024-02-16 DIAGNOSIS — Y902 Blood alcohol level of 40-59 mg/100 ml: Secondary | ICD-10-CM | POA: Diagnosis not present

## 2024-02-16 DIAGNOSIS — K429 Umbilical hernia without obstruction or gangrene: Secondary | ICD-10-CM | POA: Insufficient documentation

## 2024-02-16 DIAGNOSIS — R109 Unspecified abdominal pain: Secondary | ICD-10-CM | POA: Diagnosis present

## 2024-02-16 LAB — CBC
HCT: 42.8 % (ref 39.0–52.0)
Hemoglobin: 15.1 g/dL (ref 13.0–17.0)
MCH: 32.9 pg (ref 26.0–34.0)
MCHC: 35.3 g/dL (ref 30.0–36.0)
MCV: 93.2 fL (ref 80.0–100.0)
Platelets: 241 10*3/uL (ref 150–400)
RBC: 4.59 MIL/uL (ref 4.22–5.81)
RDW: 12.2 % (ref 11.5–15.5)
WBC: 11.8 10*3/uL — ABNORMAL HIGH (ref 4.0–10.5)
nRBC: 0 % (ref 0.0–0.2)

## 2024-02-16 LAB — COMPREHENSIVE METABOLIC PANEL WITH GFR
ALT: 19 U/L (ref 0–44)
AST: 20 U/L (ref 15–41)
Albumin: 4.5 g/dL (ref 3.5–5.0)
Alkaline Phosphatase: 57 U/L (ref 38–126)
Anion gap: 10 (ref 5–15)
BUN: 18 mg/dL (ref 6–20)
CO2: 23 mmol/L (ref 22–32)
Calcium: 9.6 mg/dL (ref 8.9–10.3)
Chloride: 104 mmol/L (ref 98–111)
Creatinine, Ser: 1.09 mg/dL (ref 0.61–1.24)
GFR, Estimated: >60 mL/min (ref >60)
Glucose, Bld: 100 mg/dL — ABNORMAL HIGH (ref 70–99)
Potassium: 3.6 mmol/L (ref 3.5–5.1)
Sodium: 137 mmol/L (ref 135–145)
Total Bilirubin: 0.4 mg/dL (ref 0.0–1.2)
Total Protein: 7.7 g/dL (ref 6.5–8.1)

## 2024-02-16 LAB — LIPASE, BLOOD: Lipase: 45 U/L (ref 11–51)

## 2024-02-16 LAB — ETHANOL: Alcohol, Ethyl (B): 53 mg/dL — ABNORMAL HIGH (ref <15)

## 2024-02-16 MED ORDER — IBUPROFEN 800 MG PO TABS
800.0000 mg | ORAL_TABLET | Freq: Once | ORAL | Status: AC
  Administered 2024-02-16: 800 mg via ORAL
  Filled 2024-02-16: qty 1

## 2024-02-16 NOTE — ED Triage Notes (Signed)
 Patient complaining of generalized abdominal pain for a week. Endorses nausea, vomiting, and diarrhea. Denies fevers.

## 2024-02-16 NOTE — Discharge Instructions (Addendum)
You may alternate Tylenol 1000 mg every 6 hours as needed for pain, fever and Ibuprofen 800 mg every 6-8 hours as needed for pain, fever.  Please take Ibuprofen with food.  Do not take more than 4000 mg of Tylenol (acetaminophen) in a 24 hour period. ° °

## 2024-02-16 NOTE — ED Notes (Signed)
Ward MD at bedside. 

## 2024-02-16 NOTE — ED Provider Notes (Signed)
 [facility] Provider Note    Event Date/Time   First MD Initiated Contact with Patient 02/16/24 [phone]     (approximate)   History   Abdominal Pain   HPI  Richard Mclean. is a 48 y.o. male with alcohol use disorder who presents to the emergency department complaints of an abdominal hernia that has been ongoing "for a while".  Patient has a hard time describing how long the hernia has been present or how long it has been bothering him.  This does not seem to be an acute issue as he states "I just wanted to get checked out".  He admits to continued alcohol use, tobacco use.  Denies any drug use.  No vomiting, diarrhea.  Having normal bowel movements, passing gas.  No fever.   History provided by patient.    Past Medical History:  Diagnosis Date   Depression     History reviewed. No pertinent surgical history.  MEDICATIONS:  Prior to Admission medications   Medication Sig Start Date End Date Taking? Authorizing Provider  amoxicillin  (AMOXIL ) 875 MG tablet Take 1 tablet (875 mg total) by mouth 2 (two) times daily. 04/27/22   Shann Darnel Rufino Coulter, PA-C  azithromycin  (ZITHROMAX ) 250 MG tablet Take 1 tablet PO daily for 4 more days starting the day after your visit to the Emergency Department 06/23/22   Iver Marker, MD  Ciprofloxacin  HCl 0.2 % otic solution Place 0.2 mLs into the right ear 2 (two) times daily. 04/27/22   Fisher, Rufino Coulter, PA-C  multivitamin (ONE-A-DAY MEN'S) TABS tablet Take 1 tablet by mouth daily. Patient not taking: Reported on 06/21/2021 02/13/21   Frederika Hukill, Clover Dao, DO  ondansetron  (ZOFRAN -ODT) 4 MG disintegrating tablet Take 1 tablet (4 mg total) by mouth every 6 (six) hours as needed for nausea or vomiting. 06/23/22   Iver Marker, MD  thiamine  100 MG tablet Take 1 tablet (100 mg total) by mouth daily. Patient not taking: Reported on 06/21/2021 02/13/21   Ibrahem Volkman, Clover Dao, DO    Physical Exam   Triage Vital Signs: ED Triage Vitals  Encounter  Vitals Group     BP 02/16/24 0240 120/82     Systolic BP Percentile --      Diastolic BP Percentile --      Pulse Rate 02/16/24 0240 86     Resp 02/16/24 0240 20     Temp 02/16/24 0240 97.9 F (36.6 C)     Temp src --      SpO2 02/16/24 0240 100 %     Weight 02/16/24 0235 185 lb (83.9 kg)     Height 02/16/24 0235 5\' 9"  (1.753 m)     Head Circumference --      Peak Flow --      Pain Score 02/16/24 0234 7     Pain Loc --      Pain Education --      Exclude from Growth Chart --     Most recent vital signs: Vitals:   02/16/24 0400 02/16/24 0430  BP: 120/81   Pulse: 75 73  Resp:    Temp:    SpO2: 96% 97%    CONSTITUTIONAL: Alert, responds appropriately to questions. Well-appearing; well-nourished, smiling and laughing, smells of alcohol HEAD: Normocephalic, atraumatic EYES: Conjunctivae clear, pupils appear equal, sclera nonicteric ENT: normal nose; moist mucous membranes, poor dentition NECK: Supple, normal ROM CARD: RRR; S1 and S2 appreciated RESP: Normal chest excursion without splinting or tachypnea; breath  sounds clear and equal bilaterally; no wheezes, no rhonchi, no rales, no hypoxia or respiratory distress, speaking full sentences ABD/GI: Non-distended; soft, non-tender, no rebound, no guarding, no peritoneal signs, small umbilical hernia that is easily reducible without overlying skin changes BACK: The back appears normal EXT: Normal ROM in all joints; no deformity noted, no edema SKIN: Normal color for age and race; warm; no rash on exposed skin NEURO: Moves all extremities equally, normal speech PSYCH: The patient's mood and manner are appropriate.   ED Results / Procedures / Treatments   LABS: (all labs ordered are listed, but only abnormal results are displayed) Labs Reviewed  COMPREHENSIVE METABOLIC PANEL WITH GFR - Abnormal; Notable for the following components:      Result Value   Glucose, Bld 100 (*)    All other components within normal limits  CBC -  Abnormal; Notable for the following components:   WBC 11.8 (*)    All other components within normal limits  ETHANOL - Abnormal; Notable for the following components:   Alcohol, Ethyl (B) 53 (*)    All other components within normal limits  LIPASE, BLOOD     EKG:  EKG Interpretation Date/Time:    Ventricular Rate:    PR Interval:    QRS Duration:    QT Interval:    QTC Calculation:   R Axis:      Text Interpretation:           RADIOLOGY: My personal review and interpretation of imaging:    I have personally reviewed all radiology reports.   No results found.   PROCEDURES:  Critical Care performed: No    Procedures    IMPRESSION / MDM / ASSESSMENT AND PLAN / ED COURSE  I reviewed the triage vital signs and the nursing notes.    Patient here for umbilical hernia.     DIFFERENTIAL DIAGNOSIS (includes but not limited to):   Umbilical hernia, no signs of incarcerated or strangulated hernia, doubt bowel obstruction, no sign of gangrene   Patient's presentation is most consistent with acute, uncomplicated illness.   PLAN: Patient here with uncomplicated umbilical hernia that is easily reducible.  No signs or symptoms of incarceration, strangulation or bowel obstruction.  Patient well-appearing here.  Labs obtained from triage show normal hemoglobin, electrolytes, renal function, LFTs and lipase.  Alcohol level minimally elevated.  Will get outpatient general surgery follow-up information.  No indication for emergent imaging or surgical evaluation at this time.  Discussed return precautions.  Will give ibuprofen  for discomfort here.  He is tolerating p.o.   MEDICATIONS GIVEN IN ED: Medications  ibuprofen  (ADVIL ) tablet 800 mg (800 mg Oral Given 02/16/24 0427)     ED COURSE:  At this time, I do not feel there is any life-threatening condition present. I reviewed all nursing notes, vitals, pertinent previous records.  All lab and urine results, EKGs, imaging  ordered have been independently reviewed and interpreted by myself.  I reviewed all available radiology reports from any imaging ordered this visit.  Based on my assessment, I feel the patient is safe to be discharged home without further emergent workup and can continue workup as an outpatient as needed. Discussed all findings, treatment plan as well as usual and customary return precautions.  They verbalize understanding and are comfortable with this plan.  Outpatient follow-up has been provided as needed.  All questions have been answered.    CONSULTS:  none   OUTSIDE RECORDS REVIEWED: Reviewed prior admission in  2017.       FINAL CLINICAL IMPRESSION(S) / ED DIAGNOSES   Final diagnoses:  Umbilical hernia without obstruction and without gangrene  Alcohol use disorder     Rx / DC Orders   ED Discharge Orders     None        Note:  This document was prepared using Dragon voice recognition software and may include unintentional dictation errors.   Keyonia Gluth, Clover Dao, DO 02/16/24 [phone]

## 2024-04-16 ENCOUNTER — Emergency Department
Admission: EM | Admit: 2024-04-16 | Discharge: 2024-04-16 | Disposition: A | Discharge status: Home / Self Care | Payer: MEDICAID | Attending: Emergency Medicine | Admitting: Emergency Medicine

## 2024-04-16 ENCOUNTER — Other Ambulatory Visit: Payer: Self-pay

## 2024-04-16 ENCOUNTER — Encounter: Payer: Self-pay | Admitting: Emergency Medicine

## 2024-04-16 DIAGNOSIS — Z5900 Homelessness unspecified: Secondary | ICD-10-CM | POA: Diagnosis not present

## 2024-04-16 DIAGNOSIS — M62830 Muscle spasm of back: Secondary | ICD-10-CM | POA: Insufficient documentation

## 2024-04-16 DIAGNOSIS — M545 Low back pain, unspecified: Secondary | ICD-10-CM | POA: Diagnosis present

## 2024-04-16 MED ORDER — IBUPROFEN 800 MG PO TABS
800.0000 mg | ORAL_TABLET | Freq: Once | ORAL | Status: AC
  Administered 2024-04-16: 800 mg via ORAL
  Filled 2024-04-16: qty 1

## 2024-04-16 MED ORDER — IBUPROFEN 800 MG PO TABS: 800.0000 mg | ORAL_TABLET | Freq: Three times a day (TID) | ORAL | 0 refills | Status: DC | PRN

## 2024-04-16 MED ORDER — LIDOCAINE 5 % EX PTCH
1.0000 | MEDICATED_PATCH | Freq: Once | CUTANEOUS | Status: DC
  Administered 2024-04-16: 1 via TRANSDERMAL
  Filled 2024-04-16: qty 1

## 2024-04-16 MED ORDER — LIDOCAINE 5 % EX PTCH
1.0000 | MEDICATED_PATCH | CUTANEOUS | 0 refills | Status: DC
  Filled 2024-04-21: qty 10, 10d supply, fill #0

## 2024-04-16 NOTE — Discharge Instructions (Addendum)
 You may alternate over the counter Tylenol 1000 mg every 6 hours as needed for pain, fever and Ibuprofen 800 mg every 6-8 hours as needed for pain, fever.  Please take Ibuprofen with food.  Do not take more than 4000 mg of Tylenol (acetaminophen) in a 24 hour period.

## 2024-04-16 NOTE — ED Triage Notes (Signed)
 Pt [place] [facility] [place] requested to come to ED for back pain and pt stated he had been picking up used vapes and cigarettes. Pt urinating on himself as well as stating he is going to poop himself.

## 2024-04-16 NOTE — ED Provider Notes (Signed)
 [facility] Provider Note    Event Date/Time   First MD Initiated Contact with Patient 04/16/24 [phone]     (approximate)   History   Back Pain   HPI  Srijan Givan. is a 48 y.o. male with history of depression, alcohol use disorder, homelessness who presents to the emergency department with complaints of low back pain.  He denies any injury.  Denies any current numbness, tingling, weakness, bowel or bladder incontinence, urinary retention.  No fever.  No prior back surgery.  He is ambulatory.   History provided by patient.    Past Medical History:  Diagnosis Date   Depression     History reviewed. No pertinent surgical history.  MEDICATIONS:  Prior to Admission medications   Medication Sig Start Date End Date Taking? Authorizing Provider  ibuprofen  (ADVIL ) 800 MG tablet Take 1 tablet (800 mg total) by mouth every 8 (eight) hours as needed. 04/16/24  Yes Michaelyn Wall, Josette SAILOR, DO  lidocaine  (LIDODERM ) 5 % Place 1 patch onto the skin daily for 10 days. Remove & Discard patch within 12 hours or as directed by MD 04/16/24 04/26/24 Yes Jonesha Tsuchiya, Josette SAILOR, DO  amoxicillin  (AMOXIL ) 875 MG tablet Take 1 tablet (875 mg total) by mouth 2 (two) times daily. 04/27/22   Gasper Devere LELON, PA-C  azithromycin  (ZITHROMAX ) 250 MG tablet Take 1 tablet PO daily for 4 more days starting the day after your visit to the Emergency Department 06/23/22   Dicky Anes, MD  Ciprofloxacin  HCl 0.2 % otic solution Place 0.2 mLs into the right ear 2 (two) times daily. 04/27/22   Fisher, Devere LELON, PA-C  multivitamin (ONE-A-DAY MEN'S) TABS tablet Take 1 tablet by mouth daily. Patient not taking: Reported on 06/21/2021 02/13/21   Graziella Connery, Josette SAILOR, DO  ondansetron  (ZOFRAN -ODT) 4 MG disintegrating tablet Take 1 tablet (4 mg total) by mouth every 6 (six) hours as needed for nausea or vomiting. 06/23/22   Dicky Anes, MD  thiamine  100 MG tablet Take 1 tablet (100 mg total) by mouth daily. Patient not taking:  Reported on 06/21/2021 02/13/21   Magdalina Whitehead, Josette SAILOR, DO    Physical Exam   Triage Vital Signs: ED Triage Vitals  Encounter Vitals Group     BP 04/16/24 0331 120/76     Girls Systolic BP Percentile --      Girls Diastolic BP Percentile --      Boys Systolic BP Percentile --      Boys Diastolic BP Percentile --      Pulse Rate 04/16/24 0331 79     Resp 04/16/24 0331 18     Temp 04/16/24 0331 97.7 F (36.5 C)     Temp Source 04/16/24 0331 Oral     SpO2 04/16/24 0331 98 %     Weight --      Height --      Head Circumference --      Peak Flow --      Pain Score 04/16/24 0332 6     Pain Loc --      Pain Education --      Exclude from Growth Chart --     Most recent vital signs: Vitals:   04/16/24 0331 04/16/24 0630  BP: 120/76 118/74  Pulse: 79 83  Resp: 18 20  Temp: 97.7 F (36.5 C) 98 F (36.7 C)  SpO2: 98% 99%    CONSTITUTIONAL: Alert, responds appropriately to questions.  Chronically ill-appearing HEAD: Normocephalic, atraumatic  EYES: Conjunctivae clear, pupils appear equal, sclera nonicteric ENT: normal nose; moist mucous membranes NECK: Supple, normal ROM CARD: RRR; S1 and S2 appreciated RESP: Normal chest excursion without splinting or tachypnea; breath sounds clear and equal bilaterally; no wheezes, no rhonchi, no rales, no hypoxia or respiratory distress, speaking full sentences ABD/GI: Non-distended; soft, non-tender, no rebound, no guarding, no peritoneal signs BACK: The back appears normal, no midline spinal tenderness or step-off or deformity EXT: Normal ROM in all joints; no deformity noted, no edema SKIN: Normal color for age and race; warm; no rash on exposed skin NEURO: Moves all extremities equally, normal speech, ambulates with normal gait, normal strength in bilateral lower extremities, 2+ bilateral deep tendon reflexes in bilateral lower extremities, no clonus, no saddle anesthesia, normal sensation diffusely PSYCH: The patient's mood and manner are  appropriate.   ED Results / Procedures / Treatments   LABS: (all labs ordered are listed, but only abnormal results are displayed) Labs Reviewed - No data to display   EKG:   RADIOLOGY: My personal review and interpretation of imaging:    I have personally reviewed all radiology reports.   No results found.   PROCEDURES:  Critical Care performed: No    Procedures    IMPRESSION / MDM / ASSESSMENT AND PLAN / ED COURSE  I reviewed the triage vital signs and the nursing notes.    Patient here with complaints of low back pain.  Neurologically intact.  No red flag symptoms.     DIFFERENTIAL DIAGNOSIS (includes but not limited to):   Muscle strain, muscle spasm, homelessness, malingering, doubt fracture, doubt cauda equina, epidural abscess hematoma, discitis or osteomyelitis, transverse myelitis, critical spinal stenosis   Patient's presentation is most consistent with acute complicated illness / injury requiring diagnostic workup.   PLAN: Will give ibuprofen , Lidoderm .  Patient is well-appearing here, able to get up quickly out of the chair without any difficulty and ambulate.  He has a normal neurologic exam and is afebrile.  Does have history of alcohol use disorder therefore I feel it be appropriate to avoid further sedatives including narcotics, muscle relaxers especially given he does not appear to be in distress.  I feel he is safe for discharge without further emergent workup.  Due to his homelessness, provided with list of local shelters.  MEDICATIONS GIVEN IN ED: Medications  lidocaine  (LIDODERM ) 5 % 1 patch (1 patch Transdermal Patch Applied 04/16/24 0725)  ibuprofen  (ADVIL ) tablet 800 mg (800 mg Oral Given 04/16/24 0723)     ED COURSE:  At this time, I do not feel there is any life-threatening condition present. I reviewed all nursing notes, vitals, pertinent previous records.  All lab and urine results, EKGs, imaging ordered have been independently  reviewed and interpreted by myself.  I reviewed all available radiology reports from any imaging ordered this visit.  Based on my assessment, I feel the patient is safe to be discharged home without further emergent workup and can continue workup as an outpatient as needed. Discussed all findings, treatment plan as well as usual and customary return precautions.  They verbalize understanding and are comfortable with this plan.  Outpatient follow-up has been provided as needed.  All questions have been answered.    CONSULTS:  none   OUTSIDE RECORDS REVIEWED: Reviewed last [facility] admission in August 2017.       FINAL CLINICAL IMPRESSION(S) / ED DIAGNOSES   Final diagnoses:  Spasm of muscle of lower back  Homelessness  Rx / DC Orders   ED Discharge Orders          Ordered    ibuprofen  (ADVIL ) 800 MG tablet  Every 8 hours PRN        04/16/24 0638    lidocaine  (LIDODERM ) 5 %  Every 24 hours        04/16/24 9361             Note:  This document was prepared using Dragon voice recognition software and may include unintentional dictation errors.   Dabria Wadas, Josette SAILOR, DO 04/16/24 [phone]

## 2024-04-21 ENCOUNTER — Other Ambulatory Visit: Payer: Self-pay

## 2024-04-26 ENCOUNTER — Other Ambulatory Visit: Payer: Self-pay

## 2024-04-26 ENCOUNTER — Emergency Department
Admission: EM | Admit: 2024-04-26 | Discharge: 2024-04-26 | Disposition: A | Discharge status: Home / Self Care | Payer: MEDICAID | Attending: Emergency Medicine | Admitting: Emergency Medicine

## 2024-04-26 DIAGNOSIS — L299 Pruritus, unspecified: Secondary | ICD-10-CM | POA: Insufficient documentation

## 2024-04-26 DIAGNOSIS — R232 Flushing: Secondary | ICD-10-CM

## 2024-04-26 MED ORDER — DIPHENHYDRAMINE HCL 25 MG PO CAPS
50.0000 mg | ORAL_CAPSULE | Freq: Once | ORAL | Status: AC
  Administered 2024-04-26: 50 mg via ORAL
  Filled 2024-04-26: qty 2

## 2024-04-26 MED ORDER — IBUPROFEN 600 MG PO TABS
600.0000 mg | ORAL_TABLET | Freq: Once | ORAL | Status: AC
  Administered 2024-04-26: 600 mg via ORAL
  Filled 2024-04-26: qty 1

## 2024-04-26 NOTE — ED Provider Notes (Signed)
 [facility] Provider Note    Event Date/Time   First MD Initiated Contact with Patient 04/26/24 1030     (approximate)   History   Itchiness   HPI  Richard Mclean. is a 48 y.o. male who presents to the ED for evaluation of Itchiness   Patient presents for evaluation of a diffuse flushing sensation.,  Scattered scabs throughout his skin.  Reports these are chronic issues though seemed worse over the past 3 days.  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  No particular trauma or lacerations.  No fevers, emesis, dizziness or syncope.   Physical Exam   Triage Vital Signs: ED Triage Vitals  Encounter Vitals Group     BP 04/26/24 1024 111/80     Girls Systolic BP Percentile --      Girls Diastolic BP Percentile --      Boys Systolic BP Percentile --      Boys Diastolic BP Percentile --      Pulse Rate 04/26/24 1024 61     Resp 04/26/24 1024 18     Temp 04/26/24 1024 97.9 F (36.6 C)     Temp Source 04/26/24 1024 Oral     SpO2 04/26/24 1024 96 %     Weight 04/26/24 1029 200 lb (90.7 kg)     Height 04/26/24 1029 5' 9 (1.753 m)     Head Circumference --      Peak Flow --      Pain Score 04/26/24 1029 10     Pain Loc --      Pain Education --      Exclude from Growth Chart --     Most recent vital signs: Vitals:   04/26/24 1024  BP: 111/80  Pulse: 61  Resp: 18  Temp: 97.9 F (36.6 C)  SpO2: 96%    General: Awake, no distress.  CV:  Good peripheral perfusion.  Resp:  Normal effort.  Abd:  No distention.  Soft and benign MSK:  No deformity noted.  Neuro:  No focal deficits appreciated. Other:  No hives or significant rash.  Couple scabbing skin lesions to his bilateral wrists and hands, nothing involving the palms or soles or mucous membranes. No wheezing or signs of anaphylaxis or significant allergic reaction.   ED Results / Procedures / Treatments   Labs (all labs ordered  are listed, but only abnormal results are displayed) Labs Reviewed - No data to display  EKG   RADIOLOGY   Official radiology report(s): No results found.  PROCEDURES and INTERVENTIONS:  Procedures  Medications  diphenhydrAMINE  (BENADRYL ) capsule 50 mg (50 mg Oral Given 04/26/24 1129)  ibuprofen  (ADVIL ) tablet 600 mg (600 mg Oral Given 04/26/24 1129)     IMPRESSION / MDM / ASSESSMENT AND PLAN / ED COURSE  I reviewed the triage vital signs and the nursing notes.  Differential diagnosis includes, but is not limited to, anaphylaxis, hives, atopic reaction, IVDU, cellulitis  {Patient presents with symptoms of an acute illness or injury that is potentially life-threatening.  Patient presents with a itching and flushing sensation suitable for outpatient management.  Fairly vague symptoms and essentially normal exam without indications for diagnostics at this point.  Provide ibuprofen  and Benadryl .  No indication for an EpiPen.  Discussed ED return precautions.      FINAL CLINICAL IMPRESSION(S) / ED DIAGNOSES   Final diagnoses:  Pruritus  Flushing     Rx / DC Orders   ED Discharge Orders     None        Note:  This document was prepared using Dragon voice recognition software and may include unintentional dictation errors.   Claudene Rover, MD 04/26/24 [phone]

## 2024-04-26 NOTE — ED Triage Notes (Addendum)
 BIBEMS, c/o itching/burning of skin, cuts/pustutles on skin x3 days and chronic back pain, and eyes burning. 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 [employer] 15. Ambulatory with EMS.

## 2024-04-26 NOTE — Discharge Instructions (Signed)
 Please take Tylenol  and ibuprofen /Advil  for your pain.  It is safe to take them together, or to alternate them every few hours.  Take up to 1000mg  of Tylenol  at a time, up to 4 times per day.  Do not take more than 4000 mg of Tylenol  in 24 hours.  For ibuprofen , take 400-600 mg, 3 - 4 times per day.  Benadryl , 25-50 mg (1-2 tablets) per dose  Return to the ED with any worsening symptoms despite these measures.

## 2024-04-29 ENCOUNTER — Emergency Department (HOSPITAL_COMMUNITY)
Admission: EM | Admit: 2024-04-29 | Discharge: 2024-04-29 | Discharge status: Against Medical Advice | Payer: MEDICAID | Attending: Emergency Medicine | Admitting: Emergency Medicine

## 2024-04-29 ENCOUNTER — Other Ambulatory Visit: Payer: Self-pay

## 2024-04-29 ENCOUNTER — Emergency Department (HOSPITAL_COMMUNITY): Payer: MEDICAID

## 2024-04-29 ENCOUNTER — Encounter (HOSPITAL_COMMUNITY): Payer: Self-pay

## 2024-04-29 DIAGNOSIS — Z5329 Procedure and treatment not carried out because of patient's decision for other reasons: Secondary | ICD-10-CM | POA: Diagnosis not present

## 2024-04-29 DIAGNOSIS — R072 Precordial pain: Secondary | ICD-10-CM | POA: Diagnosis not present

## 2024-04-29 DIAGNOSIS — M546 Pain in thoracic spine: Secondary | ICD-10-CM | POA: Insufficient documentation

## 2024-04-29 DIAGNOSIS — M79672 Pain in left foot: Secondary | ICD-10-CM | POA: Diagnosis not present

## 2024-04-29 DIAGNOSIS — D649 Anemia, unspecified: Secondary | ICD-10-CM | POA: Diagnosis not present

## 2024-04-29 DIAGNOSIS — M79671 Pain in right foot: Secondary | ICD-10-CM | POA: Insufficient documentation

## 2024-04-29 DIAGNOSIS — F17200 Nicotine dependence, unspecified, uncomplicated: Secondary | ICD-10-CM | POA: Diagnosis not present

## 2024-04-29 DIAGNOSIS — R079 Chest pain, unspecified: Secondary | ICD-10-CM | POA: Diagnosis present

## 2024-04-29 LAB — CBC
HCT: 36.8 % — ABNORMAL LOW (ref 39.0–52.0)
Hemoglobin: 12.9 g/dL — ABNORMAL LOW (ref 13.0–17.0)
MCH: 32.5 pg (ref 26.0–34.0)
MCHC: 35.1 g/dL (ref 30.0–36.0)
MCV: 92.7 fL (ref 80.0–100.0)
Platelets: 247 K/uL (ref 150–400)
RBC: 3.97 MIL/uL — ABNORMAL LOW (ref 4.22–5.81)
RDW: 11.6 % (ref 11.5–15.5)
WBC: 6.5 K/uL (ref 4.0–10.5)
nRBC: 0 % (ref 0.0–0.2)

## 2024-04-29 LAB — TROPONIN I (HIGH SENSITIVITY)
Troponin I (High Sensitivity): <2 ng/L (ref <18)
Troponin I (High Sensitivity): 3 ng/L (ref <18)

## 2024-04-29 LAB — BASIC METABOLIC PANEL WITH GFR
Anion gap: 10 (ref 5–15)
BUN: 12 mg/dL (ref 6–20)
CO2: 23 mmol/L (ref 22–32)
Calcium: 8.9 mg/dL (ref 8.9–10.3)
Chloride: 106 mmol/L (ref 98–111)
Creatinine, Ser: 1.06 mg/dL (ref 0.61–1.24)
GFR, Estimated: >60 mL/min (ref >60)
Glucose, Bld: 119 mg/dL — ABNORMAL HIGH (ref 70–99)
Potassium: 3.9 mmol/L (ref 3.5–5.1)
Sodium: 139 mmol/L (ref 135–145)

## 2024-04-29 MED ORDER — ASPIRIN 81 MG PO CHEW: 324.0000 mg | CHEWABLE_TABLET | Freq: Once | ORAL | Status: DC

## 2024-04-29 NOTE — ED Provider Notes (Signed)
 [place] EMERGENCY DEPARTMENT AT [facility] Provider Note   CSN: [id number] Arrival date & time: 04/29/24  9465     Patient presents with: Chest Pain and Back Pain   Richard Mclean. is a 48 y.o. male.   The history is provided by the patient and the EMS personnel.  Patient with history of depression presents with chest pain.  Patient arrives via EMS from [place] [employer] [facility] Patient reports over the past several hours he has had intermittent sharp chest pain as well as pain in his upper back.  He reports feeling nauseous and dizzy, but no vomiting.  No syncope.  No shortness of breath No focal arm or leg weakness.  Denies any known history of CAD/VTE Patient is a current smoker.  Patient also admits to recent cocaine use but not in the last 24 hours Patient has been given nitroglycerin and aspirin  with minimal improvement   Past Medical History:  Diagnosis Date   Depression     Prior to Admission medications   Medication Sig Start Date End Date Taking? Authorizing Provider  ibuprofen  (ADVIL ) 800 MG tablet Take 1 tablet (800 mg total) by mouth every 8 (eight) hours as needed. 04/16/24   Ward, Josette SAILOR, DO    Allergies: Patient has no known allergies.    Review of Systems  Constitutional:  Negative for fever.  Cardiovascular:  Positive for chest pain.  Neurological:  Negative for syncope.    Updated Vital Signs BP 117/73   Pulse 89   Temp 98.2 F (36.8 C)   Resp 18   SpO2 100%   Physical Exam CONSTITUTIONAL: Disheveled, no acute distress HEAD: Normocephalic/atraumatic ENMT: Mucous membranes moist, poor dentition NECK: supple no meningeal signs CV: S1/S2 noted, no murmurs/rubs/gallops noted LUNGS: Lungs are clear to auscultation bilaterally, no apparent distress Chest-no tenderness ABDOMEN: soft, nontender NEURO: Pt is awake/alert/appropriate, moves all extremitiesx4.  No facial droop.   EXTREMITIES: pulses normal/equal, full ROM, distal  pulses equal intact, no calf tenderness or edema SKIN: warm, color normal PSYCH: no abnormalities of mood noted, alert and oriented to situation  (all labs ordered are listed, but only abnormal results are displayed) Labs Reviewed  BASIC METABOLIC PANEL WITH GFR - Abnormal; Notable for the following components:      Result Value   Glucose, Bld 119 (*)    All other components within normal limits  CBC - Abnormal; Notable for the following components:   RBC 3.97 (*)    Hemoglobin 12.9 (*)    HCT 36.8 (*)    All other components within normal limits  TROPONIN I (HIGH SENSITIVITY)    EKG: EKG Interpretation Date/Time:  Wednesday April 29 2024 05:48:24 EDT Ventricular Rate:  82 PR Interval:  151 QRS Duration:  96 QT Interval:  376 QTC Calculation: 440 R Axis:   71  Text Interpretation: Sinus rhythm Interpretation limited secondary to artifact Otherwise no significant change Confirmed by Midge Golas ([id number]) on 04/29/2024 5:58:28 AM  Prehospital EKG reviewed, no acute ST changes Radiology: DG Chest Portable 1 View Result Date: 04/29/2024 EXAM: 1 VIEW XRAY OF THE CHEST 04/29/2024 06:21:49 AM COMPARISON: 06/23/2022 CLINICAL HISTORY: Chest pain. FINDINGS: LUNGS AND PLEURA: No focal pulmonary opacity. No pulmonary edema. No pleural effusion. No pneumothorax. HEART AND MEDIASTINUM: No acute abnormality of the cardiac and mediastinal silhouettes. BONES AND SOFT TISSUES: No acute osseous abnormality. IMPRESSION: 1. No acute cardiopulmonary pathology. Electronically signed by: Lonni Necessary MD 04/29/2024 06:45 AM EDT  RP Workstation: [id number]     Procedures   Medications Ordered in the ED - No data to display  Clinical Course as of 04/29/24 0705  Wed Apr 29, 2024  0550 Patient presents via EMS from [place] [employer] [facility] after reporting chest pain.  Patient reports he lives on the street has been walking around a lot.  Tonight he is having chest and back pain. He is in no acute  distress.  He does admit to recent cocaine use. Imaging and labs are pending at this time [DW]  0655 Patient overall appears improved.  Initial workup largely unremarkable though does have mild drop in his hemoglobin.  He denies any recent blood loss, denies any melena or bloody stools.  Patient is now most concerned about his feet as they have been bothering him recently as he works all day and is unhoused  will give referral to podiatry [DW]  [phone] Signed out to dr plunkett at shift change to f/u on repeat troponin [DW]    Clinical Course User Index [DW] Midge Golas, MD             HEART Score: 2                    Medical Decision Making Amount and/or Complexity of Data Reviewed Labs: ordered. Radiology: ordered.   This patient presents to the ED for concern of chest pain, this involves an extensive number of treatment options, and is a complaint that carries with it a high risk of complications and morbidity.  The differential diagnosis includes but is not limited to acute coronary syndrome, aortic dissection, pulmonary embolism, pericarditis, pneumothorax, pneumonia, myocarditis, pleurisy, esophageal rupture    Comorbidities that complicate the patient evaluation: Patient's presentation is complicated by their history of depression  Social Determinants of Health: Patient's unhoused, impaired access to primary care, and substance use  increases the complexity of managing their presentation  Additional history obtained: Additional history obtained from EMS  Records reviewed outpatient records reviewed  Lab Tests: I Ordered, and personally interpreted labs.  The pertinent results include:  mild drop in hemoglobin  Imaging Studies ordered: I ordered imaging studies including X-ray chest  I independently visualized and interpreted imaging which showed no acute findings I agree with the radiologist interpretation  Cardiac Monitoring: The patient was maintained on a cardiac  monitor.  I personally viewed and interpreted the cardiac monitor which showed an underlying rhythm of:  sinus rhythm  Test Considered: Patient is low risk / negative by HEART score, therefore do not feel that cardiac admission is indicated if troponins negaive  Reevaluation: After the interventions noted above, I reevaluated the patient and found that they have :improved  Complexity of problems addressed: Patient's presentation is most consistent with  acute presentation with potential threat to life or bodily function      Final diagnoses:  Precordial pain  Bilateral foot pain    ED Discharge Orders     None          Midge Golas, MD 04/29/24 [phone]

## 2024-04-29 NOTE — ED Provider Notes (Signed)
 Pt waiting on delta trop.  He left AMA before second trop.  However repeat troponin was negative at 3 so patient was not attempted to be contacted.   Doretha Folks, MD 04/29/24 [phone]

## 2024-04-29 NOTE — Discharge Instructions (Addendum)

## 2024-04-29 NOTE — ED Triage Notes (Signed)
 PT BIB EMS FROM [facility] FOR C/O CHEST PAIN AND BACK PAIN X1 HOUR. NITROGLYCERIN X1 AND 325 ASA ON BOARD

## 2024-04-29 NOTE — ED Notes (Signed)
 Pt left ama. AMA paper work reviewed with pt and sign. Pt has no questions at this time about paper work.

## 2024-05-01 ENCOUNTER — Ambulatory Visit (HOSPITAL_COMMUNITY)
Admission: EM | Admit: 2024-05-01 | Discharge: 2024-05-02 | Disposition: A | Discharge status: Other Acute Inpatient Hospital | Payer: MEDICAID | Attending: Urology | Admitting: Urology

## 2024-05-01 DIAGNOSIS — F333 Major depressive disorder, recurrent, severe with psychotic symptoms: Secondary | ICD-10-CM

## 2024-05-01 DIAGNOSIS — F1911 Other psychoactive substance abuse, in remission: Secondary | ICD-10-CM | POA: Insufficient documentation

## 2024-05-01 DIAGNOSIS — I445 Left posterior fascicular block: Secondary | ICD-10-CM | POA: Insufficient documentation

## 2024-05-01 DIAGNOSIS — F17290 Nicotine dependence, other tobacco product, uncomplicated: Secondary | ICD-10-CM | POA: Insufficient documentation

## 2024-05-01 DIAGNOSIS — F3010 Manic episode without psychotic symptoms, unspecified: Secondary | ICD-10-CM

## 2024-05-01 DIAGNOSIS — F17210 Nicotine dependence, cigarettes, uncomplicated: Secondary | ICD-10-CM | POA: Insufficient documentation

## 2024-05-01 DIAGNOSIS — F319 Bipolar disorder, unspecified: Secondary | ICD-10-CM | POA: Insufficient documentation

## 2024-05-01 DIAGNOSIS — F1010 Alcohol abuse, uncomplicated: Secondary | ICD-10-CM | POA: Insufficient documentation

## 2024-05-02 ENCOUNTER — Other Ambulatory Visit: Payer: Self-pay

## 2024-05-02 ENCOUNTER — Inpatient Hospital Stay (HOSPITAL_COMMUNITY)
Admission: AD | Admit: 2024-05-02 | Discharge: 2024-05-07 | DRG: 885 | Disposition: A | Discharge status: Home / Self Care | Payer: MEDICAID | Source: Intra-hospital | Attending: Psychiatry | Admitting: Psychiatry

## 2024-05-02 ENCOUNTER — Encounter (HOSPITAL_COMMUNITY): Payer: Self-pay | Admitting: Family Medicine

## 2024-05-02 DIAGNOSIS — F315 Bipolar disorder, current episode depressed, severe, with psychotic features: Secondary | ICD-10-CM | POA: Diagnosis not present

## 2024-05-02 DIAGNOSIS — Z1152 Encounter for screening for COVID-19: Secondary | ICD-10-CM | POA: Diagnosis not present

## 2024-05-02 DIAGNOSIS — Z560 Unemployment, unspecified: Secondary | ICD-10-CM

## 2024-05-02 DIAGNOSIS — Z5941 Food insecurity: Secondary | ICD-10-CM

## 2024-05-02 DIAGNOSIS — Z818 Family history of other mental and behavioral disorders: Secondary | ICD-10-CM

## 2024-05-02 DIAGNOSIS — F17210 Nicotine dependence, cigarettes, uncomplicated: Secondary | ICD-10-CM | POA: Diagnosis present

## 2024-05-02 DIAGNOSIS — F3130 Bipolar disorder, current episode depressed, mild or moderate severity, unspecified: Secondary | ICD-10-CM | POA: Diagnosis not present

## 2024-05-02 DIAGNOSIS — Z72820 Sleep deprivation: Secondary | ICD-10-CM

## 2024-05-02 DIAGNOSIS — F1010 Alcohol abuse, uncomplicated: Secondary | ICD-10-CM | POA: Diagnosis present

## 2024-05-02 DIAGNOSIS — F17290 Nicotine dependence, other tobacco product, uncomplicated: Secondary | ICD-10-CM | POA: Diagnosis present

## 2024-05-02 DIAGNOSIS — Z5986 Financial insecurity: Secondary | ICD-10-CM

## 2024-05-02 DIAGNOSIS — F319 Bipolar disorder, unspecified: Secondary | ICD-10-CM | POA: Diagnosis not present

## 2024-05-02 DIAGNOSIS — Z5900 Homelessness unspecified: Secondary | ICD-10-CM

## 2024-05-02 DIAGNOSIS — Z79899 Other long term (current) drug therapy: Secondary | ICD-10-CM

## 2024-05-02 DIAGNOSIS — F1210 Cannabis abuse, uncomplicated: Secondary | ICD-10-CM | POA: Diagnosis present

## 2024-05-02 DIAGNOSIS — F333 Major depressive disorder, recurrent, severe with psychotic symptoms: Secondary | ICD-10-CM | POA: Diagnosis present

## 2024-05-02 DIAGNOSIS — Z811 Family history of alcohol abuse and dependence: Secondary | ICD-10-CM

## 2024-05-02 DIAGNOSIS — F1911 Other psychoactive substance abuse, in remission: Secondary | ICD-10-CM | POA: Diagnosis not present

## 2024-05-02 DIAGNOSIS — F1410 Cocaine abuse, uncomplicated: Secondary | ICD-10-CM | POA: Diagnosis present

## 2024-05-02 DIAGNOSIS — Z830 Family history of human immunodeficiency virus [HIV] disease: Secondary | ICD-10-CM

## 2024-05-02 LAB — LIPID PANEL
Cholesterol: 217 mg/dL — ABNORMAL HIGH (ref 0–200)
HDL: 64 mg/dL (ref >40)
LDL Cholesterol: 135 mg/dL — ABNORMAL HIGH (ref 0–99)
Total CHOL/HDL Ratio: 3.4 ratio
Triglycerides: 89 mg/dL (ref <150)
VLDL: 18 mg/dL (ref 0–40)

## 2024-05-02 LAB — COMPREHENSIVE METABOLIC PANEL WITH GFR
ALT: 33 U/L (ref 0–44)
AST: 34 U/L (ref 15–41)
Albumin: 4.6 g/dL (ref 3.5–5.0)
Alkaline Phosphatase: 67 U/L (ref 38–126)
Anion gap: 12 (ref 5–15)
BUN: 10 mg/dL (ref 6–20)
CO2: 25 mmol/L (ref 22–32)
Calcium: 9.7 mg/dL (ref 8.9–10.3)
Chloride: 100 mmol/L (ref 98–111)
Creatinine, Ser: 1.07 mg/dL (ref 0.61–1.24)
GFR, Estimated: >60 mL/min (ref >60)
Glucose, Bld: 112 mg/dL — ABNORMAL HIGH (ref 70–99)
Potassium: 3.8 mmol/L (ref 3.5–5.1)
Sodium: 137 mmol/L (ref 135–145)
Total Bilirubin: 1 mg/dL (ref 0.0–1.2)
Total Protein: 7.2 g/dL (ref 6.5–8.1)

## 2024-05-02 LAB — POCT URINE DRUG SCREEN - MANUAL ENTRY (I-SCREEN)
POC Amphetamine UR: NOT DETECTED
POC Buprenorphine (BUP): NOT DETECTED
POC Cocaine UR: NOT DETECTED
POC Marijuana UR: POSITIVE — AB
POC Methadone UR: NOT DETECTED
POC Methamphetamine UR: NOT DETECTED
POC Morphine: NOT DETECTED
POC Oxazepam (BZO): NOT DETECTED
POC Oxycodone UR: NOT DETECTED
POC Secobarbital (BAR): NOT DETECTED

## 2024-05-02 LAB — CBC WITH DIFFERENTIAL/PLATELET
Abs Immature Granulocytes: 0.04 K/uL (ref 0.00–0.07)
Basophils Absolute: 0.1 K/uL (ref 0.0–0.1)
Basophils Relative: 1 %
Eosinophils Absolute: 0.1 K/uL (ref 0.0–0.5)
Eosinophils Relative: 1 %
HCT: 41.6 % (ref 39.0–52.0)
Hemoglobin: 14.2 g/dL (ref 13.0–17.0)
Immature Granulocytes: 0 %
Lymphocytes Relative: 22 %
Lymphs Abs: 2 K/uL (ref 0.7–4.0)
MCH: 31.8 pg (ref 26.0–34.0)
MCHC: 34.1 g/dL (ref 30.0–36.0)
MCV: 93.1 fL (ref 80.0–100.0)
Monocytes Absolute: 0.8 K/uL (ref 0.1–1.0)
Monocytes Relative: 9 %
Neutro Abs: 6.4 K/uL (ref 1.7–7.7)
Neutrophils Relative %: 67 %
Platelets: 307 K/uL (ref 150–400)
RBC: 4.47 MIL/uL (ref 4.22–5.81)
RDW: 11.7 % (ref 11.5–15.5)
WBC: 9.3 K/uL (ref 4.0–10.5)
nRBC: 0 % (ref 0.0–0.2)

## 2024-05-02 LAB — TSH: TSH: 2.877 u[IU]/mL (ref 0.350–4.500)

## 2024-05-02 LAB — ETHANOL: Alcohol, Ethyl (B): <15 mg/dL (ref <15)

## 2024-05-02 MED ORDER — LORAZEPAM 2 MG/ML IJ SOLN: 2.0000 mg | Freq: Three times a day (TID) | INTRAMUSCULAR | Status: DC | PRN

## 2024-05-02 MED ORDER — LOPERAMIDE HCL 2 MG PO CAPS: 2.0000 mg | ORAL_CAPSULE | ORAL | Status: DC | PRN

## 2024-05-02 MED ORDER — LORAZEPAM 1 MG PO TABS
1.0000 mg | ORAL_TABLET | Freq: Every day | ORAL | Status: AC
  Administered 2024-05-05 (×2): 1 mg via ORAL
  Filled 2024-05-02: qty 1

## 2024-05-02 MED ORDER — LORAZEPAM 1 MG PO TABS: 1.0000 mg | ORAL_TABLET | Freq: Four times a day (QID) | ORAL | Status: AC | PRN

## 2024-05-02 MED ORDER — DIPHENHYDRAMINE HCL 50 MG/ML IJ SOLN: 50.0000 mg | Freq: Three times a day (TID) | INTRAMUSCULAR | Status: DC | PRN

## 2024-05-02 MED ORDER — HALOPERIDOL 5 MG PO TABS: 5.0000 mg | ORAL_TABLET | Freq: Three times a day (TID) | ORAL | Status: DC | PRN

## 2024-05-02 MED ORDER — ADULT MULTIVITAMIN W/MINERALS CH: 1.0000 | ORAL_TABLET | Freq: Every day | ORAL | Status: DC

## 2024-05-02 MED ORDER — ONDANSETRON 4 MG PO TBDP: 4.0000 mg | ORAL_TABLET | Freq: Four times a day (QID) | ORAL | Status: AC | PRN

## 2024-05-02 MED ORDER — HALOPERIDOL LACTATE 5 MG/ML IJ SOLN: 10.0000 mg | Freq: Three times a day (TID) | INTRAMUSCULAR | Status: DC | PRN

## 2024-05-02 MED ORDER — ACETAMINOPHEN 325 MG PO TABS: 650.0000 mg | ORAL_TABLET | Freq: Four times a day (QID) | ORAL | Status: DC | PRN

## 2024-05-02 MED ORDER — MAGNESIUM HYDROXIDE 400 MG/5ML PO SUSP: 30.0000 mL | Freq: Every day | ORAL | Status: DC | PRN

## 2024-05-02 MED ORDER — LORAZEPAM 1 MG PO TABS
1.0000 mg | ORAL_TABLET | Freq: Two times a day (BID) | ORAL | Status: AC
  Administered 2024-05-04 (×4): 1 mg via ORAL
  Filled 2024-05-02 (×2): qty 1

## 2024-05-02 MED ORDER — DIPHENHYDRAMINE HCL 50 MG PO CAPS
50.0000 mg | ORAL_CAPSULE | Freq: Three times a day (TID) | ORAL | Status: DC | PRN
Start: 2024-05-02 — End: 2024-05-02

## 2024-05-02 MED ORDER — ONDANSETRON 4 MG PO TBDP: 4.0000 mg | ORAL_TABLET | Freq: Four times a day (QID) | ORAL | Status: DC | PRN

## 2024-05-02 MED ORDER — OLANZAPINE 5 MG PO TABS
5.0000 mg | ORAL_TABLET | Freq: Two times a day (BID) | ORAL | Status: DC
  Administered 2024-05-03 – 2024-05-05 (×9): 5 mg via ORAL
  Filled 2024-05-02 (×6): qty 1

## 2024-05-02 MED ORDER — HALOPERIDOL LACTATE 5 MG/ML IJ SOLN: 5.0000 mg | Freq: Three times a day (TID) | INTRAMUSCULAR | Status: DC | PRN

## 2024-05-02 MED ORDER — HYDROXYZINE HCL 25 MG PO TABS: 25.0000 mg | ORAL_TABLET | Freq: Four times a day (QID) | ORAL | Status: DC | PRN

## 2024-05-02 MED ORDER — DIPHENHYDRAMINE HCL 25 MG PO CAPS: 50.0000 mg | ORAL_CAPSULE | Freq: Three times a day (TID) | ORAL | Status: DC | PRN

## 2024-05-02 MED ORDER — HYDROXYZINE HCL 25 MG PO TABS: 25.0000 mg | ORAL_TABLET | Freq: Three times a day (TID) | ORAL | Status: DC | PRN

## 2024-05-02 MED ORDER — TRAZODONE HCL 50 MG PO TABS
50.0000 mg | ORAL_TABLET | Freq: Every evening | ORAL | Status: DC | PRN
  Administered 2024-05-03 – 2024-05-05 (×5): 50 mg via ORAL
  Filled 2024-05-02 (×4): qty 1

## 2024-05-02 MED ORDER — TRAZODONE HCL 50 MG PO TABS: 50.0000 mg | ORAL_TABLET | Freq: Every evening | ORAL | Status: DC | PRN

## 2024-05-02 MED ORDER — THIAMINE HCL 100 MG/ML IJ SOLN: 100.0000 mg | Freq: Once | INTRAMUSCULAR | Status: DC

## 2024-05-02 MED ORDER — OLANZAPINE 5 MG PO TABS
5.0000 mg | ORAL_TABLET | Freq: Once | ORAL | Status: AC
  Administered 2024-05-02: 5 mg via ORAL
  Filled 2024-05-02: qty 1

## 2024-05-02 MED ORDER — LORAZEPAM 1 MG PO TABS: 1.0000 mg | ORAL_TABLET | Freq: Four times a day (QID) | ORAL | Status: DC | PRN

## 2024-05-02 MED ORDER — HALOPERIDOL 5 MG PO TABS
5.0000 mg | ORAL_TABLET | Freq: Three times a day (TID) | ORAL | Status: DC | PRN
Start: 2024-05-02 — End: 2024-05-02

## 2024-05-02 MED ORDER — THIAMINE MONONITRATE 100 MG PO TABS: 100.0000 mg | ORAL_TABLET | Freq: Every day | ORAL | Status: DC

## 2024-05-02 MED ORDER — VITAMIN B-1 100 MG PO TABS
100.0000 mg | ORAL_TABLET | Freq: Every day | ORAL | Status: DC
  Administered 2024-05-03 – 2024-05-07 (×8): 100 mg via ORAL
  Filled 2024-05-02 (×5): qty 1

## 2024-05-02 MED ORDER — ALUM & MAG HYDROXIDE-SIMETH 200-200-20 MG/5ML PO SUSP: 30.0000 mL | ORAL | Status: DC | PRN

## 2024-05-02 MED ORDER — HYDROXYZINE HCL 25 MG PO TABS
25.0000 mg | ORAL_TABLET | Freq: Four times a day (QID) | ORAL | Status: AC | PRN
  Administered 2024-05-03 – 2024-05-04 (×3): 25 mg via ORAL
  Filled 2024-05-02 (×2): qty 1

## 2024-05-02 MED ORDER — LORAZEPAM 1 MG PO TABS
1.0000 mg | ORAL_TABLET | Freq: Four times a day (QID) | ORAL | Status: AC
  Administered 2024-05-02 – 2024-05-03 (×3): 1 mg via ORAL
  Filled 2024-05-02 (×3): qty 1

## 2024-05-02 MED ORDER — LORAZEPAM 1 MG PO TABS
1.0000 mg | ORAL_TABLET | Freq: Three times a day (TID) | ORAL | Status: AC
  Administered 2024-05-03 (×3): 1 mg via ORAL
  Filled 2024-05-02 (×3): qty 1

## 2024-05-02 NOTE — [facility] Group Notes (Signed)
 [facility] Group Notes:  (Nursing/MHT/Case Management/Adjunct)  Date:  05/02/2024  Time:  9:26 PM  Type of Therapy:  Wrap-up group  Participation Level:  Did Not Attend  Participation Quality:    Affect:    Cognitive:    Insight:    Engagement in Group:    Modes of Intervention:    Summary of Progress/Problems:Pt refused to attend group.  Grayce LITTIE Essex 05/02/2024, 9:26 PM

## 2024-05-02 NOTE — ED Notes (Signed)
 Pt hasn't went to sleep he has been up talking to himself not making any sense. Sitting up in the bed with the covers over him just sitting there.

## 2024-05-02 NOTE — ED Provider Notes (Signed)
 FBC/OBS ASAP Discharge Summary  Date and Time: 05/02/2024 9:27 AM  Name: Richard Mclean.  MRN:  [id number]   Discharge Diagnoses:  Final diagnoses:  Severe episode of recurrent major depressive disorder, with psychotic features (HCC)  Manic behavior (HCC)    Subjective: Life's good, I feel good-stable  Stay Summary: Richard Mclean. 48 year old male with history of MDD and AUD admitted to [facility] voluntarily admitted, and naked walking remains making statements such as per admission encounter documentation, He repeatedly states that he is in mental distress and frequently pulse checks, stating "I'm making sure I'm alive. Patient today reports, Life's good, I feel good-stable. Endorses achieving sleep and ate two bowls of oh-oh cheerios.  Patient is visibly restless and pacing around the unit. Patient was asked multiple times about his mental health history. Patient finally acknowledged hx of MDD and reports bipolar disorder, although BP is not documented on chart review. Pt denied SI/HI on admission and continues to deny today. Objectively, patient at present doesn't appear to be responding to internal or external stimuli.  Discussed with patient the need for inpatient hospitalization to treat current mental health symptoms of mania, depression with psychotic features given current symptoms of disorganized thoughts. Ethanol level <10, however patient has a significant history  CIWA protocol initiated and patient has required no medications for withdrawal symptoms.  Patient continues to meet inpatient criteria for psychiatric treatment and has been accepted to [facility]   Total Time spent with patient: 30 minutes  Past Psychiatric History: MDD, Bipolar (per patient), Alcohol Use Disorder, Cannibus Use, and Cocaine Use  Past Medical History: None reported Family History:  Family History  Problem Relation Age of Onset   Alcohol abuse Mother    Depression Mother     Mental illness Mother    Mental illness Father    Mental illness Sister    HIV/AIDS Brother    Mental illness Maternal Grandmother     Family Psychiatric History: See family hx  Social History: Per patient lives with mother in [place]  Tobacco Cessation:  A prescription for an FDA-approved tobacco cessation medication provided at discharge  Current Medications:  Current Facility-Administered Medications  Medication Dose Route Frequency Provider Last Rate Last Admin   acetaminophen  (TYLENOL ) tablet 650 mg  650 mg Oral Q6H PRN Ajibola, Ene A, NP       alum & mag hydroxide-simeth (MAALOX/MYLANTA) 200-200-20 MG/5ML suspension 30 mL  30 mL Oral Q4H PRN Ajibola, Ene A, NP       haloperidol  (HALDOL ) tablet 5 mg  5 mg Oral TID PRN Ajibola, Ene A, NP       And   diphenhydrAMINE  (BENADRYL ) capsule 50 mg  50 mg Oral TID PRN Ajibola, Ene A, NP       haloperidol  lactate (HALDOL ) injection 10 mg  10 mg Intramuscular TID PRN Ajibola, Ene A, NP       And   diphenhydrAMINE  (BENADRYL ) injection 50 mg  50 mg Intramuscular TID PRN Ajibola, Ene A, NP       And   LORazepam  (ATIVAN ) injection 2 mg  2 mg Intramuscular TID PRN Ajibola, Ene A, NP       haloperidol  lactate (HALDOL ) injection 5 mg  5 mg Intramuscular TID PRN Ajibola, Ene A, NP       And   diphenhydrAMINE  (BENADRYL ) injection 50 mg  50 mg Intramuscular TID PRN Ajibola, Ene A, NP       And  LORazepam  (ATIVAN ) injection 2 mg  2 mg Intramuscular TID PRN Ajibola, Ene A, NP       hydrOXYzine  (ATARAX ) tablet 25 mg  25 mg Oral Q6H PRN Ajibola, Ene A, NP       loperamide  (IMODIUM ) capsule 2-4 mg  2-4 mg Oral PRN Ajibola, Ene A, NP       LORazepam  (ATIVAN ) tablet 1 mg  1 mg Oral Q6H PRN Ajibola, Ene A, NP       magnesium  hydroxide (MILK OF MAGNESIA) suspension 30 mL  30 mL Oral Daily PRN Ajibola, Ene A, NP       multivitamin with minerals tablet 1 tablet  1 tablet Oral Daily Ajibola, Ene A, NP       OLANZapine  (ZYPREXA ) tablet 5 mg  5 mg Oral Once  Ajibola, Ene A, NP       ondansetron  (ZOFRAN -ODT) disintegrating tablet 4 mg  4 mg Oral Q6H PRN Ajibola, Ene A, NP       thiamine  (VITAMIN B1) injection 100 mg  100 mg Intramuscular Once Ajibola, Ene A, NP       [START ON 05/03/2024] thiamine  (VITAMIN B1) tablet 100 mg  100 mg Oral Daily Ajibola, Ene A, NP       traZODone  (DESYREL ) tablet 50 mg  50 mg Oral QHS PRN Ajibola, Ene A, NP       No current outpatient medications on file.    PTA Medications:  Facility Ordered Medications  Medication   acetaminophen  (TYLENOL ) tablet 650 mg   alum & mag hydroxide-simeth (MAALOX/MYLANTA) 200-200-20 MG/5ML suspension 30 mL   magnesium  hydroxide (MILK OF MAGNESIA) suspension 30 mL   haloperidol  (HALDOL ) tablet 5 mg   And   diphenhydrAMINE  (BENADRYL ) capsule 50 mg   haloperidol  lactate (HALDOL ) injection 10 mg   And   diphenhydrAMINE  (BENADRYL ) injection 50 mg   And   LORazepam  (ATIVAN ) injection 2 mg   haloperidol  lactate (HALDOL ) injection 5 mg   And   diphenhydrAMINE  (BENADRYL ) injection 50 mg   And   LORazepam  (ATIVAN ) injection 2 mg   traZODone  (DESYREL ) tablet 50 mg   thiamine  (VITAMIN B1) injection 100 mg   [START ON 05/03/2024] thiamine  (VITAMIN B1) tablet 100 mg   multivitamin with minerals tablet 1 tablet   LORazepam  (ATIVAN ) tablet 1 mg   hydrOXYzine  (ATARAX ) tablet 25 mg   loperamide  (IMODIUM ) capsule 2-4 mg   ondansetron  (ZOFRAN -ODT) disintegrating tablet 4 mg   OLANZapine  (ZYPREXA ) tablet 5 mg       10/31/2017    2:56 PM  Depression screen PHQ 2/9  Decreased Interest 3  Down, Depressed, Hopeless 3  PHQ - 2 Score 6  Altered sleeping 2  Tired, decreased energy 3  Change in appetite 0  Feeling bad or failure about yourself  3  Trouble concentrating 3  Moving slowly or fidgety/restless 3  Suicidal thoughts 0  PHQ-9 Score 20    Flowsheet Row ED from 05/01/2024 in [facility] ED from 04/16/2024 in [facility] Emergency Department at [facility] ED from 02/16/2024 in [facility] Emergency Department at [facility]  C-SSRS RISK CATEGORY No Risk No Risk No Risk    Musculoskeletal  Strength & Muscle Tone: within normal limits Gait & Station: normal Patient leans: N/A  Psychiatric Specialty Exam  Presentation  General Appearance:  Disheveled  Eye Contact: Fair  Speech: Clear and Coherent  Speech Volume: Decreased  Handedness: Right   Mood and Affect  Mood: Anxious  Affect: Congruent   Thought Process  Thought Processes: Coherent  Descriptions of Associations:Tangential  Orientation:Partial  Thought Content:WDL  Diagnosis of Schizophrenia or Schizoaffective disorder in past: No    Hallucinations:Hallucinations: Auditory  Ideas of Reference:Paranoia; Delusions  Suicidal Thoughts:Suicidal Thoughts: No  Homicidal Thoughts:Homicidal Thoughts: No   Sensorium  Memory: Immediate Good; Recent Good; Remote Fair  Judgment: Impaired  Insight: Poor   Executive Functions  Concentration: Poor  Attention Span: Poor  Recall: Poor  Fund of Knowledge: Poor  Language: Poor   Psychomotor Activity  Psychomotor Activity: Psychomotor Activity: Normal   Assets  Assets: Physical Health   Sleep  Sleep: Sleep: Fair  Estimated Sleeping Duration (Last 24 Hours): 0.00 hours  Nutritional Assessment (For OBS and FBC admissions only) Has the patient had a weight loss or gain of 10 pounds or more in the last 3 months?: No Has the patient had a decrease in food intake/or appetite?: No Does the patient have dental problems?: No Does the patient have eating habits or behaviors that may be indicators of an eating disorder including binging or inducing vomiting?: No Has the patient recently lost weight without trying?: 0 Has the patient been eating poorly because of a decreased appetite?: 0 Malnutrition Screening Tool Score: 0    Physical Exam  Physical Exam Constitutional:       General: He is not in acute distress.    Appearance: Normal appearance. He is not ill-appearing.  HENT:     Head: Normocephalic and atraumatic.     Nose: Nose normal.  Eyes:     Extraocular Movements: Extraocular movements intact.     Conjunctiva/sclera: Conjunctivae normal.     Pupils: Pupils are equal, round, and reactive to light.  Cardiovascular:     Rate and Rhythm: Normal rate and regular rhythm.  Pulmonary:     Effort: Pulmonary effort is normal.     Breath sounds: Normal breath sounds.  Musculoskeletal:     Cervical back: Normal range of motion and neck supple.  Skin:    General: Skin is warm and dry.  Neurological:     General: No focal deficit present.     Mental Status: He is alert and oriented to person, place, and time.     Review of Systems  Psychiatric/Behavioral:  Positive for depression, hallucinations and substance abuse. Negative for suicidal ideas. The patient is nervous/anxious.     Blood pressure (!) 140/91, pulse 81, temperature 98.7 F (37.1 C), temperature source Oral, resp. rate 16, SpO2 98%. There is no height or weight on file to calculate BMI.  Demographic Factors:  Male, Caucasian, and Low socioeconomic status  Loss Factors: Decrease in vocational status  Historical Factors: Impulsivity  Risk Reduction Factors:   NA  Continued Clinical Symptoms:  Previous Psychiatric Diagnoses and Treatments  Cognitive Features That Contribute To Risk:  Thought constriction (tunnel vision)    Suicide Risk:  Mild:  Suicidal ideation of limited frequency, intensity, duration, and specificity.  There are no identifiable plans, no associated intent, mild dysphoria and related symptoms, good self-control (both objective and subjective assessment), few other risk factors, and identifiable protective factors, including available and accessible social support.  Plan Of Care/Follow-up recommendations:  Other:  MDD with psychosis, tx with Olanzapine  5 mg  BID for psychosis and depression. QTC <500. Continue CIWA protocol given hx of Alcohol dependence. Inpatient tx, pt accepted to [facility]  Disposition: [facility] inpatient treatment   Suzen Lesches, NP 05/02/2024, 9:27 AM

## 2024-05-02 NOTE — ED Notes (Signed)
 Pt was cooperative on arrival to the unit.He had bizarre behaviors, making bizarre gestures with his hands as if he was holding something in the air. Pt's thoughts are disorganized. He said he is here because of Need for survival, psychological trauma, truth, things  and poisoning. Pt requested a snack and juice which was provided and he ate. Pt denied SI, HI, AVH. His feet are red in color with some abrasions. Pt said it is from him walking too much. Per report, he was found naked outside and was brought in by the Police. Staff was oriented to the unit and his belongings secured per hospital protocol/ Staff will continue to monitor for safety.

## 2024-05-02 NOTE — Progress Notes (Signed)
 [facility]/BMU LCSW Progress Note   05/02/2024    10:06 AM  Richard Mclean.   [id number]   Type of Contact and Topic:  Psychiatric Bed Placement   Pt accepted to [facility] 406-1  Patient meets inpatient criteria per Suzen Lesches, NP    The attending provider will be Dr. Delsa  Call report to [phone]    Rafe Gerold, RN @ [facility] notified.     Pt scheduled  to arrive at [facility] TODAY.    Bunnie Gallop, MSW, LCSW-A  10:07 AM 05/02/2024

## 2024-05-02 NOTE — Progress Notes (Signed)
 Patient is a 48 year old male who presented from the [facility] under voluntary admission due to psychosis- being found naked by [employer] who brought him in. Pt has a past psych hx of Bipolar disorder and etoh use disorder. Pt reported that he hasn't taken medications for several years, and that he drinks a 12 pk of beer a day, stating his last drink was a few days ago. Pt currently denies withdrawal symptoms, SI/HI, paranoia and A/VH. Pt doesn't appear to be responding to internal stimuli, but does appear very groggy, stating, I need to get some sleep. Pt is homeless, stating for 15 years, and named his parents as his support. Pt presented calm and cooperative, was mildly disorganized when answering questions.  VS monitored and recorded. Skin check performed with MHT and revealed superficial abrasions bilateral feet from walking without shoes and quarter sized abrasion on the crown of his head from a tree branch. Belongings searched and secured in locker. Admission paperwork completed and signed.  Patient was oriented to unit and schedule. Pt provided with meal and po fluids. Q 15 min checks initiated for safety.

## 2024-05-02 NOTE — Tx Team (Signed)
 Initial Treatment Plan 05/02/2024 5:59 PM [name] [name]. FMW:[id number]    PATIENT STRESSORS: Medication change or noncompliance   Substance abuse   Other: homelessness     PATIENT STRENGTHS: Printmaker for treatment/growth  Physical Health  Supportive family/friends    PATIENT IDENTIFIED PROBLEMS: Psychosis      I just need to sleep               DISCHARGE CRITERIA:  Adequate post-discharge living arrangements Improved stabilization in mood, thinking, and/or behavior Verbal commitment to aftercare and medication compliance Withdrawal symptoms are absent or subacute and managed without 24-hour nursing intervention  PRELIMINARY DISCHARGE PLAN: Outpatient therapy  PATIENT/FAMILY INVOLVEMENT: This treatment plan has been presented to and reviewed with the patient, Tiandre Teall., and/or family member,  .  The patient and family have been given the opportunity to ask questions and make suggestions.  Berwyn GORMAN Acosta, RN 05/02/2024, 5:59 PM

## 2024-05-02 NOTE — ED Provider Notes (Signed)
 [facility] Continuous Assessment Admission H&P  Date: 05/02/24 Patient Name: Richard Mclean. MRN: [id number] Chief Complaint: mental distress lead poison    Diagnoses:  Final diagnoses:  Alcohol abuse    HPI: Richard Mclean. is a 48 year old male with a history of polysubstance abuse (alcohol, marijuana, and cocaine) who was brought voluntarily to [facility] by [employer] for evaluation of psychosis.   Patient was assessed face to face and his chart was reviewed by this NP. On assessment, patient is noted to be actively responding to internal stimuli- talking and arguing with people not present in the room. He repeatedly states that he is in mental distress. He endorses paranoid delusions, including beliefs that he has been poisoned by lead and that radiation and other impurities have been placed in his cigarettes. He reports that he has been walking barefoot and naked in an effort to remove the impurities from his body. His behavior is bizarre, he is whispering to avoid detection, frequently pulse checks, stating "I'm making sure I'm alive," saluting this NP multiple times, and making disorganized statements related to purifying his body. He reports daily alcohol use, he says he drinks about 6 cans of beers per day and uses unknown quantity of marijuana and cocaine 1-2 times per month. He denies suicidal ideation, homicidal ideation, and visual hallucinations. UDS is positive for cannabis   Patient is alert and oriented to self and place. He is disheveled. Mood is described as "distressed," with anxious affect. Speech is disorganized and tangential. Thought process is illogical and circumstantial, and thought content notable for paranoid delusions. The patient is responding to internal stimuli. Insight is poor, and judgment is impaired. He denies suicidal or homicidal ideation, and visual hallucinations.   Total Time spent with patient: 45 minutes  Musculoskeletal  Strength & Muscle  Tone: within normal limits Gait & Station: normal Patient leans: Right  Psychiatric Specialty Exam  Presentation General Appearance:  Disheveled  Eye Contact: Fair  Speech: Clear and Coherent  Speech Volume: Decreased  Handedness: Right   Mood and Affect  Mood: Anxious  Affect: Congruent   Thought Process  Thought Processes: Coherent  Descriptions of Associations:Tangential  Orientation:Partial  Thought Content:WDL  Diagnosis of Schizophrenia or Schizoaffective disorder in past: No   Hallucinations:Hallucinations: Auditory  Ideas of Reference:Paranoia; Delusions  Suicidal Thoughts:Suicidal Thoughts: No  Homicidal Thoughts:Homicidal Thoughts: No   Sensorium  Memory: Immediate Good; Recent Good; Remote Fair  Judgment: Impaired  Insight: Poor   Executive Functions  Concentration: Poor  Attention Span: Poor  Recall: Poor  Fund of Knowledge: Poor  Language: Poor   Psychomotor Activity  Psychomotor Activity: Psychomotor Activity: Normal   Assets  Assets: Physical Health   Sleep  Sleep: Sleep: Fair Number of Hours of Sleep: 6   Nutritional Assessment (For OBS and FBC admissions only) Has the patient had a weight loss or gain of 10 pounds or more in the last 3 months?: No Has the patient had a decrease in food intake/or appetite?: No Does the patient have dental problems?: No Does the patient have eating habits or behaviors that may be indicators of an eating disorder including binging or inducing vomiting?: No Has the patient recently lost weight without trying?: 0 Has the patient been eating poorly because of a decreased appetite?: 0 Malnutrition Screening Tool Score: 0    Physical Exam Vitals and nursing note reviewed.  Constitutional:      General: He is not in acute distress.  Appearance: He is well-developed.  HENT:     Head: Normocephalic and atraumatic.  Eyes:     Conjunctiva/sclera: Conjunctivae  normal.  Cardiovascular:     Rate and Rhythm: Normal rate.     Heart sounds: No murmur heard. Pulmonary:     Effort: Pulmonary effort is normal. No respiratory distress.  Abdominal:     Palpations: Abdomen is soft.     Tenderness: There is no abdominal tenderness.  Musculoskeletal:        General: No swelling.     Cervical back: Neck supple.  Skin:    General: Skin is warm and dry.     Capillary Refill: Capillary refill takes less than 2 seconds.  Neurological:     Mental Status: He is alert and oriented to person, place, and time.  Psychiatric:        Attention and Perception: Attention and perception normal.        Mood and Affect: Mood normal.        Speech: Speech normal.        Behavior: Behavior normal. Behavior is cooperative.        Thought Content: Thought content normal.    Review of Systems  Constitutional: Negative.   HENT: Negative.    Eyes: Negative.   Respiratory: Negative.    Cardiovascular: Negative.   Gastrointestinal: Negative.   Genitourinary: Negative.   Musculoskeletal: Negative.   Skin: Negative.   Neurological: Negative.   Endo/Heme/Allergies: Negative.   Psychiatric/Behavioral:  Positive for hallucinations and substance abuse. The patient is nervous/anxious.     Blood pressure 138/89, pulse 81, temperature 98.8 F (37.1 C), temperature source Oral, SpO2 99%. There is no height or weight on file to calculate BMI.  Past Psychiatric History: . polysubstance abuse (alcohol, marijuana, and cocaine)  Is the patient at risk to self? No  Has the patient been a risk to self in the past 6 months? No .    Has the patient been a risk to self within the distant past? No   Is the patient a risk to others? No   Has the patient been a risk to others in the past 6 months? No   Has the patient been a risk to others within the distant past? No   Past Medical History:  Past Medical History:  Diagnosis Date   Depression     Past Medical History:   Diagnosis Date   Depression     Family History:  Family History  Problem Relation Age of Onset   Alcohol abuse Mother    Depression Mother    Mental illness Mother    Mental illness Father    Mental illness Sister    HIV/AIDS Brother    Mental illness Maternal Grandmother      Social History:  Social History   Tobacco Use   Smoking status: Every Day    Types: Cigarettes   Smokeless tobacco: Never  Vaping Use   Vaping status: Every Day  Substance Use Topics   Alcohol use: Yes    Comment: Malt liquor or liquor every other weekend   Drug use: Not Currently    Types: Marijuana     Last Labs:  Admission on 05/01/2024  Component Date Value Ref Range Status   WBC 05/02/2024 9.3  4.0 - 10.5 K/uL Final   RBC 05/02/2024 4.47  4.22 - 5.81 MIL/uL Final   Hemoglobin 05/02/2024 14.2  13.0 - 17.0 g/dL Final   HCT 91/90/7974 41.6  39.0 - 52.0 % Final   MCV 05/02/2024 93.1  80.0 - 100.0 fL Final   MCH 05/02/2024 31.8  26.0 - 34.0 pg Final   MCHC 05/02/2024 34.1  30.0 - 36.0 g/dL Final   RDW 91/90/7974 11.7  11.5 - 15.5 % Final   Platelets 05/02/2024 307  150 - 400 K/uL Final   nRBC 05/02/2024 0.0  0.0 - 0.2 % Final   Neutrophils Relative % 05/02/2024 67  % Final   Neutro Abs 05/02/2024 6.4  1.7 - 7.7 K/uL Final   Lymphocytes Relative 05/02/2024 22  % Final   Lymphs Abs 05/02/2024 2.0  0.7 - 4.0 K/uL Final   Monocytes Relative 05/02/2024 9  % Final   Monocytes Absolute 05/02/2024 0.8  0.1 - 1.0 K/uL Final   Eosinophils Relative 05/02/2024 1  % Final   Eosinophils Absolute 05/02/2024 0.1  0.0 - 0.5 K/uL Final   Basophils Relative 05/02/2024 1  % Final   Basophils Absolute 05/02/2024 0.1  0.0 - 0.1 K/uL Final   Immature Granulocytes 05/02/2024 0  % Final   Abs Immature Granulocytes 05/02/2024 0.04  0.00 - 0.07 K/uL Final   Performed at [facility], [address], [place], [place] [address]   Sodium 05/02/2024 137  135 - 145 mmol/L Final   Potassium 05/02/2024 3.8   3.5 - 5.1 mmol/L Final   Chloride 05/02/2024 100  98 - 111 mmol/L Final   CO2 05/02/2024 25  22 - 32 mmol/L Final   Glucose, Bld 05/02/2024 112 (H)  70 - 99 mg/dL Final   Glucose reference range applies only to samples taken after fasting for at least 8 hours.   BUN 05/02/2024 10  6 - 20 mg/dL Final   Creatinine, Ser 05/02/2024 1.07  0.61 - 1.24 mg/dL Final   Calcium 91/90/7974 9.7  8.9 - 10.3 mg/dL Final   Total Protein 91/90/7974 7.2  6.5 - 8.1 g/dL Final   Albumin 91/90/7974 4.6  3.5 - 5.0 g/dL Final   AST 91/90/7974 34  15 - 41 U/L Final   ALT 05/02/2024 33  0 - 44 U/L Final   Alkaline Phosphatase 05/02/2024 67  38 - 126 U/L Final   Total Bilirubin 05/02/2024 1.0  0.0 - 1.2 mg/dL Final   GFR, Estimated 05/02/2024 >60  >60 mL/min Final   Comment: (NOTE) Calculated using the CKD-EPI Creatinine Equation (2021)    Anion gap 05/02/2024 12  5 - 15 Final   Performed at [facility], [address], [place], [place] [address]   Alcohol, Ethyl (B) 05/02/2024 <15  <15 mg/dL Final   Comment: (NOTE) For medical purposes only. Performed at [facility], [address] [place], [place] [address]    Cholesterol 05/02/2024 217 (H)  0 - 200 mg/dL Final   Triglycerides 91/90/7974 89  <150 mg/dL Final   HDL 91/90/7974 64  >40 mg/dL Final   Total CHOL/HDL Ratio 05/02/2024 3.4  RATIO Final   VLDL 05/02/2024 18  0 - 40 mg/dL Final   LDL Cholesterol 05/02/2024 135 (H)  0 - 99 mg/dL Final   Comment:        Total Cholesterol/HDL:CHD Risk Coronary Heart Disease Risk Table                     Men   Women  1/2 Average Risk   3.4   3.3  Average Risk       5.0  4.4  2 X Average Risk   9.6   7.1  3 X Average Risk  23.4   11.0        Use the calculated Patient Ratio above and the CHD Risk Table to determine the patient's CHD Risk.        ATP III CLASSIFICATION (LDL):  <100     mg/dL   Optimal  899-870  mg/dL   Near or Above                    Optimal  130-159  mg/dL   Borderline   839-810  mg/dL   High  >809     mg/dL   Very High Performed at [facility], [address], [place], [place] [address]    POC Amphetamine UR 05/02/2024 None Detected  NONE DETECTED (Cut Off Level 1000 ng/mL) Final   POC Secobarbital (BAR) 05/02/2024 None Detected  NONE DETECTED (Cut Off Level 300 ng/mL) Final   POC Buprenorphine (BUP) 05/02/2024 None Detected  NONE DETECTED (Cut Off Level 10 ng/mL) Final   POC Oxazepam (BZO) 05/02/2024 None Detected  NONE DETECTED (Cut Off Level 300 ng/mL) Final   POC Cocaine UR 05/02/2024 None Detected  NONE DETECTED (Cut Off Level 300 ng/mL) Final   POC Methamphetamine UR 05/02/2024 None Detected  NONE DETECTED (Cut Off Level 1000 ng/mL) Final   POC Morphine 05/02/2024 None Detected  NONE DETECTED (Cut Off Level 300 ng/mL) Final   POC Methadone UR 05/02/2024 None Detected  NONE DETECTED (Cut Off Level 300 ng/mL) Final   POC Oxycodone UR 05/02/2024 None Detected  NONE DETECTED (Cut Off Level 100 ng/mL) Final   POC Marijuana UR 05/02/2024 Positive (A)  NONE DETECTED (Cut Off Level 50 ng/mL) Final   TSH 05/02/2024 2.877  0.350 - 4.500 uIU/mL Final   Comment: Performed by a 3rd Generation assay with a functional sensitivity of <=0.01 uIU/mL. Performed at [facility], [address], [place], [place] [address]   Admission on 04/29/2024, Discharged on 04/29/2024  Component Date Value Ref Range Status   Sodium 04/29/2024 139  135 - 145 mmol/L Final   Potassium 04/29/2024 3.9  3.5 - 5.1 mmol/L Final   Chloride 04/29/2024 106  98 - 111 mmol/L Final   CO2 04/29/2024 23  22 - 32 mmol/L Final   Glucose, Bld 04/29/2024 119 (H)  70 - 99 mg/dL Final   Glucose reference range applies only to samples taken after fasting for at least 8 hours.   BUN 04/29/2024 12  6 - 20 mg/dL Final   Creatinine, Ser 04/29/2024 1.06  0.61 - 1.24 mg/dL Final   Calcium 91/93/7974 8.9  8.9 - 10.3 mg/dL Final   GFR, Estimated 04/29/2024 >60  >60 mL/min Final   Comment:  (NOTE) Calculated using the CKD-EPI Creatinine Equation (2021)    Anion gap 04/29/2024 10  5 - 15 Final   Performed at [facility], [address] [place], [place] [address]   Troponin I (High Sensitivity) 04/29/2024 <2  <18 ng/L Final   Comment: (NOTE) Elevated high sensitivity troponin I (hsTnI) values and significant  changes across serial measurements may suggest ACS but many other  chronic and acute conditions are known to elevate hsTnI results.  Refer to the Links section for chest pain algorithms and additional  guidance. Performed at [facility], [address] [place], [place] [address]    WBC 04/29/2024 6.5  4.0 -  10.5 K/uL Final   RBC 04/29/2024 3.97 (L)  4.22 - 5.81 MIL/uL Final   Hemoglobin 04/29/2024 12.9 (L)  13.0 - 17.0 g/dL Final   HCT 91/93/7974 36.8 (L)  39.0 - 52.0 % Final   MCV 04/29/2024 92.7  80.0 - 100.0 fL Final   MCH 04/29/2024 32.5  26.0 - 34.0 pg Final   MCHC 04/29/2024 35.1  30.0 - 36.0 g/dL Final   RDW 91/93/7974 11.6  11.5 - 15.5 % Final   Platelets 04/29/2024 247  150 - 400 K/uL Final   nRBC 04/29/2024 0.0  0.0 - 0.2 % Final   Performed at [facility], [address] [place], [place] [address]   Troponin I (High Sensitivity) 04/29/2024 3  <18 ng/L Final   Comment: (NOTE) Elevated high sensitivity troponin I (hsTnI) values and significant  changes across serial measurements may suggest ACS but many other  chronic and acute conditions are known to elevate hsTnI results.  Refer to the Links section for chest pain algorithms and additional  guidance. Performed at [facility], [address], [place], [place] [address]   Admission on 02/16/2024, Discharged on 02/16/2024  Component Date Value Ref Range Status   Lipase 02/16/2024 45  11 - 51 U/L Final   Performed at [facility], [address] [place], [place] [address]   Sodium 02/16/2024 137  135 - 145 mmol/L Final   Potassium 02/16/2024 3.6  3.5 - 5.1  mmol/L Final   Chloride 02/16/2024 104  98 - 111 mmol/L Final   CO2 02/16/2024 23  22 - 32 mmol/L Final   Glucose, Bld 02/16/2024 100 (H)  70 - 99 mg/dL Final   Glucose reference range applies only to samples taken after fasting for at least 8 hours.   BUN 02/16/2024 18  6 - 20 mg/dL Final   Creatinine, Ser 02/16/2024 1.09  0.61 - 1.24 mg/dL Final   Calcium 94/74/7974 9.6  8.9 - 10.3 mg/dL Final   Total Protein 94/74/7974 7.7  6.5 - 8.1 g/dL Final   Albumin 94/74/7974 4.5  3.5 - 5.0 g/dL Final   AST 94/74/7974 20  15 - 41 U/L Final   ALT 02/16/2024 19  0 - 44 U/L Final   Alkaline Phosphatase 02/16/2024 57  38 - 126 U/L Final   Total Bilirubin 02/16/2024 0.4  0.0 - 1.2 mg/dL Final   GFR, Estimated 02/16/2024 >60  >60 mL/min Final   Comment: (NOTE) Calculated using the CKD-EPI Creatinine Equation (2021)    Anion gap 02/16/2024 10  5 - 15 Final   Performed at [facility], [address] [place], [place] [address]   WBC 02/16/2024 11.8 (H)  4.0 - 10.5 K/uL Final   RBC 02/16/2024 4.59  4.22 - 5.81 MIL/uL Final   Hemoglobin 02/16/2024 15.1  13.0 - 17.0 g/dL Final   HCT 94/74/7974 42.8  39.0 - 52.0 % Final   MCV 02/16/2024 93.2  80.0 - 100.0 fL Final   MCH 02/16/2024 32.9  26.0 - 34.0 pg Final   MCHC 02/16/2024 35.3  30.0 - 36.0 g/dL Final   RDW 94/74/7974 12.2  11.5 - 15.5 % Final   Platelets 02/16/2024 241  150 - 400 K/uL Final   nRBC 02/16/2024 0.0  0.0 - 0.2 % Final   Performed at [facility], [address] [place], [place] [address]   Alcohol, Ethyl (B) 02/16/2024 53 (H)  <15 mg/dL Final   Comment: (NOTE)  For medical purposes only. Performed at [facility], [address], [place], [place] [address]     Allergies: Patient has no known allergies.  Medications:  Facility Ordered Medications  Medication   acetaminophen  (TYLENOL ) tablet 650 mg   alum & mag hydroxide-simeth (MAALOX/MYLANTA) 200-200-20 MG/5ML suspension 30 mL   magnesium   hydroxide (MILK OF MAGNESIA) suspension 30 mL   haloperidol  (HALDOL ) tablet 5 mg   And   diphenhydrAMINE  (BENADRYL ) capsule 50 mg   haloperidol  lactate (HALDOL ) injection 10 mg   And   diphenhydrAMINE  (BENADRYL ) injection 50 mg   And   LORazepam  (ATIVAN ) injection 2 mg   haloperidol  lactate (HALDOL ) injection 5 mg   And   diphenhydrAMINE  (BENADRYL ) injection 50 mg   And   LORazepam  (ATIVAN ) injection 2 mg   traZODone  (DESYREL ) tablet 50 mg   thiamine  (VITAMIN B1) injection 100 mg   [START ON 05/03/2024] thiamine  (VITAMIN B1) tablet 100 mg   multivitamin with minerals tablet 1 tablet   LORazepam  (ATIVAN ) tablet 1 mg   hydrOXYzine  (ATARAX ) tablet 25 mg   loperamide  (IMODIUM ) capsule 2-4 mg   ondansetron  (ZOFRAN -ODT) disintegrating tablet 4 mg   OLANZapine  (ZYPREXA ) tablet 5 mg   PTA Medications  Medication Sig   ibuprofen  (ADVIL ) 800 MG tablet Take 1 tablet (800 mg total) by mouth every 8 (eight) hours as needed.      Medical Decision Making  Patient is recommended for inpatient admission for stabilization.  Lab Orders         SARS Coronavirus 2 by RT PCR (hospital order, performed in [facility] hospital lab) *cepheid single result test* Anterior Nasal Swab         CBC with Differential/Platelet         Comprehensive metabolic panel         Hemoglobin A1c         Ethanol         Lipid panel         TSH         POCT Urine Drug Screen - (I-Screen)     Initiate CIWA protocol -lorazepam  1 mg every 6 hours prn for CIWA >10 -thiamine  100 mg daily for nutritional supplementation -hydroxyzine  25 mg every 6 hours prn for anxiety, CIWA < or = 10 -ondansetron  4 mg ODT every 6 hours prn nausea/vomiting -loperamide  2-4 mg capsule prn diarrhea or loose stools  Psychosis -Zyprexa  5mg /day     Recommendations  Based on my evaluation the patient does not appear to have an emergency medical condition.  Kathryne DELENA Show, NP 05/02/24  7:15 AM
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
## 2024-05-02 NOTE — Plan of Care (Signed)
   Problem: Education: Goal: Knowledge of [facility] Education information/materials will improve Outcome: Progressing Goal: Verbalization of understanding the information provided will improve Outcome: Progressing

## 2024-05-02 NOTE — Progress Notes (Signed)
   05/02/24 2318  Psych Admission Type (Psych Patients Only)  Admission Status Voluntary  Psychosocial Assessment  Patient Complaints Depression  Eye Contact Brief  Facial Expression Flat  Affect Appropriate to circumstance  Speech Logical/coherent  Interaction Assertive  Motor Activity Slow  Appearance/Hygiene Disheveled;Body odor  Behavior Characteristics Appropriate to situation  Mood Depressed  Thought Process  Coherency Disorganized  Content WDL  Delusions None reported or observed  Perception WDL  Hallucination None reported or observed  Judgment Poor  Confusion UTA  Danger to Self  Current suicidal ideation? Denies  Agreement Not to Harm Self Yes  Description of Agreement verbal  Danger to Others  Danger to Others None reported or observed

## 2024-05-02 NOTE — Group Note (Signed)
 Date:  05/02/2024 Time:  5:52 PM  Group Topic/Focus:  Wellness Toolbox:   The focus of this group is to introduce the topic of wellness and using collage as a creative outlet for expressing emotions, reducing anxiety, fostering group cohesion, and enabling personal insight and healing.   Participation Level:  Did Not Attend

## 2024-05-02 NOTE — Discharge Instructions (Addendum)
Accepted to [facility]

## 2024-05-02 NOTE — ED Notes (Signed)
 Patient initially refused medication pretending to take it and then palming the pill.  Patient eventually took it after several attempts by Clinical research associate.  Mouth check done.  Patient remains illogical and disorganized.  He is pleasantly delusional often singing parts of songs out loud.  He ate breakfast and is now lying on his bed quietly.  He has been accepted to [facility] and is aware.  Waiting for bed clearance and patient will be transferred via safe transport.  He denies avh shi or plan.  Will monitor.

## 2024-05-02 NOTE — Group Note (Deleted)
 Date:  05/02/2024 Time:  9:26 PM  Group Topic/Focus:  Recovery Goals:   The focus of this group is to identify appropriate goals for recovery and establish a plan to achieve them.     Participation Level:  {BHH PARTICIPATION OZCZO:77735}  Participation Quality:  {BHH PARTICIPATION QUALITY:22265}  Affect:  {BHH AFFECT:22266}  Cognitive:  {BHH COGNITIVE:22267}  Insight: {BHH Insight2:20797}  Engagement in Group:  {BHH ENGAGEMENT IN HMNLE:77731}  Modes of Intervention:  {BHH MODES OF INTERVENTION:22269}  Additional Comments:  ***  Lacorey Brusca L 05/02/2024, 9:26 PM

## 2024-05-02 NOTE — Group Note (Signed)
 LCSW Group Therapy Note  @TD @ 1:15pm  Type of Therapy and Topic:  Group Therapy - Safety  Participation Level:  Did Not Attend   Description of Group This process group involved patients discussing the situations or people in their lives that frequently make them safe or unsafe.  Anxiety was a common factor among all group participants and many of them described home situations that keep them on edge and not able to feel completely safe.  Three questions were addressed during the group:  (1) What makes you feel safe (or unsafe)?  (2) Do you feel safe with yourself and why?  (3) If you don't feel safe, what can you do?  A lengthy discussion ensued in which group members empathized with each other, gave suggestions to one another, and expressed their feelings freely.  Therapeutic Goals Patient will describe what makes them feel safe or unsafe in their everyday lives. Patient will think about and discuss whether they feel safe with themselves and what reasons might contribute to feeling safe or unsafe. Patients will participate in planning for what can be done to help themselves feel safer.   Summary of Patient Progress:  NA   Therapeutic Modalities Cognitive Behavioral Therapy    Richard Mclean Emelin Dascenzo, LCSWA 05/02/2024  3:37 PM

## 2024-05-03 DIAGNOSIS — F315 Bipolar disorder, current episode depressed, severe, with psychotic features: Secondary | ICD-10-CM

## 2024-05-03 MED ORDER — FLUOXETINE HCL 20 MG PO CAPS
20.0000 mg | ORAL_CAPSULE | Freq: Every day | ORAL | Status: DC
  Administered 2024-05-04 – 2024-05-07 (×7): 20 mg via ORAL
  Filled 2024-05-03 (×4): qty 1

## 2024-05-03 NOTE — Plan of Care (Signed)
 Nurse discussed anxiety, depression and coping skills with patient.
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
## 2024-05-03 NOTE — Progress Notes (Signed)
 (  Sleep Hours) -  (Any PRNs that were needed, meds refused, or side effects to meds)- None  (Any disturbances and when (visitation, over night)- None  (Concerns raised by the patient)- None  (SI/HI/AVH)- Denies
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
## 2024-05-03 NOTE — Group Note (Signed)
 Date:  05/03/2024 Time:  5:10 PM  Group Topic/Focus:  Healthy Communication:   The focus of this group is to discuss communication, barriers to communication, as well as healthy ways to communicate with others.    Participation Level:  Active  Participation Quality:  Appropriate and Attentive  Affect:  Flat  Cognitive:  Alert  Insight: Improving  Engagement in Group:  Engaged and Improving  Modes of Intervention:  Activity, Discussion, Education, Exploration, and Socialization  Additional Comments:    Berwyn GORMAN Acosta 05/03/2024, 5:10 PM

## 2024-05-03 NOTE — Progress Notes (Signed)
Pt did not attend goals group. 

## 2024-05-03 NOTE — H&P (Addendum)
 Psychiatric Admission Assessment Adult  Patient Identification: Richard Mclean. MRN:  [id number] Date of Evaluation:  05/03/2024 Chief Complaint:  MDD (major depressive disorder), recurrent, severe, with psychosis (HCC) [F33.3] Principal Diagnosis: MDD (major depressive disorder), recurrent, severe, with psychosis (HCC) Diagnosis:  Active Problems:   Bipolar I disorder, current or most recent episode depressed, with psychotic features (HCC)   History of Present Illness:  48 year old male with a history of polysubstance abuse (alcohol, marijuana, and cocaine) who was brought voluntarily to [facility] by [employer] for evaluation of psychosis.    PER Pysch NP evaluation at  [facility] on 8/8: On assessment, patient is noted to be actively responding to internal stimuli- talking and arguing with people not present in the room. He repeatedly states that he is in mental distress. He endorses paranoid delusions, including beliefs that he has been poisoned by lead and that radiation and other impurities have been placed in his cigarettes. He reports that he has been walking barefoot and naked in an effort to remove the impurities from his body. His behavior is bizarre, he is whispering to avoid detection, frequently pulse checks, stating "I'm making sure I'm alive," saluting this NP multiple times, and making disorganized statements related to purifying his body. He reports daily alcohol use, he says he drinks about 6 cans of beers per day and uses unknown quantity of marijuana and cocaine 1-2 times per month. He denies suicidal ideation, homicidal ideation, and visual hallucinations. UDS is positive for cannabis  Patient was seen for followup yesterday by NP Harris as follows:  naked walking remains making statements such as per admission encounter documentation, He repeatedly states that he is in mental distress and frequently pulse checks, stating "I'm making sure I'm alive. Patient today reports,  Life's good, I feel good-stable. Endorses achieving sleep and ate two bowls of oh-oh cheerios.  Patient is visibly restless and pacing around the unit. Patient was asked multiple times about his mental health history. Patient finally acknowledged hx of MDD and reports bipolar disorder, although BP is not documented on chart review. Pt denied SI/HI on admission and continues to deny today. Objectively, patient at present doesn't appear to be responding to internal or external stimuli.  Discussed with patient the need for inpatient hospitalization to treat current mental health symptoms of mania, depression with psychotic features given current symptoms of disorganized thoughts.   Patient seen at bedside. Patient confirms above presentation stating that someone was trying to kill him. Patient reports he has been feeling depressed with symptoms below for the past 2 weeks. States he has been homeless and hallucinating however is oriented x 3 at time of evaluation. States hallucinations and paranoia are Better with restarting Zyprexa . Reports history of manic episodes lasting up to a week as below but denies any recent manic symptoms. Reports 2 weeks of depression with psychotic features. Patient states he feels safer today and that the [facility] are lower. He denies any command AH and would like to leave Zyprex at current dose but states he feels hopeless and depressed and would like to try Prozac . Interested in sober living environment.  Associated Signs/Symptoms: Depression Symptoms:  depressed mood, anhedonia, psychomotor retardation, fatigue, feelings of worthlessness/guilt, difficulty concentrating, hopelessness, Duration of Depression Symptoms: Less than two weeks  (Hypo) Manic Symptoms:  patient denies Anxiety Symptoms:  Excessive Worry, Psychotic Symptoms:  Delusions, Hallucinations: Auditory Ideas of Reference, Paranoia, PTSD Symptoms: Negative Total Time spent with patient: 45  minutes  Past Psychiatric Hx: Previous Psych Diagnoses:  MDD, Bipolar (per patient), Alcohol Use Disorder, Cannibus Use, and Cocaine Use  Prior inpatient treatment: denies Current/prior outpatient treatment:  denies Psychotherapy [id number] History of suicide: denies History of homicide: denies Psychiatric medication history:denies Psychiatric medication compliance history:denies   Substance Abuse Hx: Alcohol: reports 6 beers a day Tobacco: denies Illicit drugs: reports cocaine use last week what I can get my hands on Rx drug abuse: denies Rehab hx: denies  Past Medical History: Past Medical History:  Diagnosis Date   Depression      Family History: Reports extensive family history of mental health disorder with mother, father and sister both suffering but is unable to further elaborate. Denies suicide in family   Social History: Patient homeless, was previously living with mother in [place]  Is the patient at risk to self? Yes.    Has the patient been a risk to self in the past 6 months? No.  Has the patient been a risk to self within the distant past? No.  Is the patient a risk to others? No.  Has the patient been a risk to others in the past 6 months? No.  Has the patient been a risk to others within the distant past? No.    Alcohol Screening:    Substance Abuse History in the last 12 months:  Yes.   Consequences of Substance Abuse: Negative Previous Psychotropic Medications: Yes  Psychological Evaluations: Yes  Past Medical History:  Past Medical History:  Diagnosis Date   Depression    History reviewed. No pertinent surgical history. Family History:  Family History  Problem Relation Age of Onset   Alcohol abuse Mother    Depression Mother    Mental illness Mother    Mental illness Father    Mental illness Sister    HIV/AIDS Brother    Mental illness Maternal Grandmother    Family Psychiatric  History: see HPI Tobacco Screening:   Social  History:  Social History   Substance and Sexual Activity  Alcohol Use Yes   Comment: Malt liquor or liquor every other weekend     Social History   Substance and Sexual Activity  Drug Use Not Currently   Types: Marijuana    Additional Social History: Marital status: Single Are you sexually active?: No What is your sexual orientation?: straight Has your sexual activity been affected by drugs, alcohol, medication, or emotional stress?: no Does patient have children?: No                         Allergies:  No Known Allergies Lab Results:  Results for orders placed or performed during the hospital encounter of 05/01/24 (from the past 48 hours)  CBC with Differential/Platelet     Status: None   Collection Time: 05/02/24  2:21 AM  Result Value Ref Range   WBC 9.3 4.0 - 10.5 K/uL   RBC 4.47 4.22 - 5.81 MIL/uL   Hemoglobin 14.2 13.0 - 17.0 g/dL   HCT 58.3 60.9 - 47.9 %   MCV 93.1 80.0 - 100.0 fL   MCH 31.8 26.0 - 34.0 pg   MCHC 34.1 30.0 - 36.0 g/dL   RDW 88.2 88.4 - 84.4 %   Platelets 307 150 - 400 K/uL   nRBC 0.0 0.0 - 0.2 %   Neutrophils Relative % 67 %   Neutro Abs 6.4 1.7 - 7.7 K/uL   Lymphocytes Relative 22 %   Lymphs Abs 2.0 0.7 - 4.0 K/uL  Monocytes Relative 9 %   Monocytes Absolute 0.8 0.1 - 1.0 K/uL   Eosinophils Relative 1 %   Eosinophils Absolute 0.1 0.0 - 0.5 K/uL   Basophils Relative 1 %   Basophils Absolute 0.1 0.0 - 0.1 K/uL   Immature Granulocytes 0 %   Abs Immature Granulocytes 0.04 0.00 - 0.07 K/uL    Comment: Performed at [facility], [address] [place], [place] [address]  Comprehensive metabolic panel     Status: Abnormal   Collection Time: 05/02/24  2:21 AM  Result Value Ref Range   Sodium 137 135 - 145 mmol/L   Potassium 3.8 3.5 - 5.1 mmol/L   Chloride 100 98 - 111 mmol/L   CO2 25 22 - 32 mmol/L   Glucose, Bld 112 (H) 70 - 99 mg/dL    Comment: Glucose reference range applies only to samples taken after fasting for at  least 8 hours.   BUN 10 6 - 20 mg/dL   Creatinine, Ser 8.92 0.61 - 1.24 mg/dL   Calcium 9.7 8.9 - 89.6 mg/dL   Total Protein 7.2 6.5 - 8.1 g/dL   Albumin 4.6 3.5 - 5.0 g/dL   AST 34 15 - 41 U/L   ALT 33 0 - 44 U/L   Alkaline Phosphatase 67 38 - 126 U/L   Total Bilirubin 1.0 0.0 - 1.2 mg/dL   GFR, Estimated >39 >39 mL/min    Comment: (NOTE) Calculated using the CKD-EPI Creatinine Equation (2021)    Anion gap 12 5 - 15    Comment: Performed at [facility], [address] [place], [place] [address]  Ethanol     Status: None   Collection Time: 05/02/24  2:21 AM  Result Value Ref Range   Alcohol, Ethyl (B) <15 <15 mg/dL    Comment: (NOTE) For medical purposes only. Performed at [facility], [address] [place], [place] [address]   Lipid panel     Status: Abnormal   Collection Time: 05/02/24  2:21 AM  Result Value Ref Range   Cholesterol 217 (H) 0 - 200 mg/dL   Triglycerides 89 <849 mg/dL   HDL 64 >59 mg/dL   Total CHOL/HDL Ratio 3.4 RATIO   VLDL 18 0 - 40 mg/dL   LDL Cholesterol 864 (H) 0 - 99 mg/dL    Comment:        Total Cholesterol/HDL:CHD Risk Coronary Heart Disease Risk Table                     Men   Women  1/2 Average Risk   3.4   3.3  Average Risk       5.0   4.4  2 X Average Risk   9.6   7.1  3 X Average Risk  23.4   11.0        Use the calculated Patient Ratio above and the CHD Risk Table to determine the patient's CHD Risk.        ATP III CLASSIFICATION (LDL):  <100     mg/dL   Optimal  899-870  mg/dL   Near or Above                    Optimal  130-159  mg/dL   Borderline  839-810  mg/dL   High  >809     mg/dL   Very High Performed at [facility], [address], [place], [place]  [address]   TSH     Status: None   Collection Time: 05/02/24  2:21 AM  Result Value Ref Range   TSH 2.877 0.350 - 4.500 uIU/mL    Comment: Performed by a 3rd Generation assay with a functional sensitivity of <=0.01 uIU/mL. Performed at [facility], [address], [place], [place] [address]   POCT Urine Drug Screen - (I-Screen)     Status: Abnormal   Collection Time: 05/02/24  2:42 AM  Result Value Ref Range   POC Amphetamine UR None Detected NONE DETECTED (Cut Off Level 1000 ng/mL)   POC Secobarbital (BAR) None Detected NONE DETECTED (Cut Off Level 300 ng/mL)   POC Buprenorphine (BUP) None Detected NONE DETECTED (Cut Off Level 10 ng/mL)   POC Oxazepam (BZO) None Detected NONE DETECTED (Cut Off Level 300 ng/mL)   POC Cocaine UR None Detected NONE DETECTED (Cut Off Level 300 ng/mL)   POC Methamphetamine UR None Detected NONE DETECTED (Cut Off Level 1000 ng/mL)   POC Morphine None Detected NONE DETECTED (Cut Off Level 300 ng/mL)   POC Methadone UR None Detected NONE DETECTED (Cut Off Level 300 ng/mL)   POC Oxycodone UR None Detected NONE DETECTED (Cut Off Level 100 ng/mL)   POC Marijuana UR Positive (A) NONE DETECTED (Cut Off Level 50 ng/mL)    Blood Alcohol level:  Lab Results  Component Value Date   ETH <15 05/02/2024   ETH 53 (H) 02/16/2024    Metabolic Disorder Labs:  No results found for: HGBA1C, MPG No results found for: PROLACTIN Lab Results  Component Value Date   CHOL 217 (H) 05/02/2024   TRIG 89 05/02/2024   HDL 64 05/02/2024   CHOLHDL 3.4 05/02/2024   VLDL 18 05/02/2024   LDLCALC 135 (H) 05/02/2024    Current Medications: Current Facility-Administered Medications  Medication Dose Route Frequency Provider Last Rate Last Admin   acetaminophen  (TYLENOL ) tablet 650 mg  650 mg Oral Q6H PRN Arloa Suzen RAMAN, NP       alum & mag hydroxide-simeth (MAALOX/MYLANTA) 200-200-20 MG/5ML suspension 30 mL  30 mL Oral Q4H PRN Arloa Suzen RAMAN, NP       haloperidol  (HALDOL ) tablet 5 mg  5 mg Oral TID PRN Arloa Suzen RAMAN, NP       And   diphenhydrAMINE  (BENADRYL ) capsule 50 mg  50 mg Oral TID PRN Arloa Suzen RAMAN, NP       haloperidol  lactate (HALDOL ) injection 5 mg  5 mg Intramuscular TID PRN Arloa Suzen RAMAN, NP       And   diphenhydrAMINE  (BENADRYL ) injection 50 mg  50 mg Intramuscular TID PRN Arloa Suzen RAMAN, NP       And   LORazepam  (ATIVAN ) injection 2 mg  2 mg Intramuscular TID PRN Arloa Suzen RAMAN, NP       haloperidol  lactate (HALDOL ) injection 10 mg  10 mg Intramuscular TID PRN Arloa Suzen RAMAN, NP       And   diphenhydrAMINE  (BENADRYL ) injection 50 mg  50 mg Intramuscular TID PRN Arloa Suzen RAMAN, NP       And   LORazepam  (ATIVAN ) injection 2 mg  2 mg Intramuscular TID PRN Arloa Suzen RAMAN, NP       [START ON 05/04/2024] FLUoxetine  (PROZAC ) capsule 20 mg  20 mg Oral Daily Aijalon Kirtz, MD       hydrOXYzine  (ATARAX ) tablet 25 mg  25 mg Oral Q6H PRN Arloa Suzen RAMAN, NP  LORazepam  (ATIVAN ) tablet 1 mg  1 mg Oral Q6H PRN Arloa Suzen RAMAN, NP       LORazepam  (ATIVAN ) tablet 1 mg  1 mg Oral TID Arloa Suzen RAMAN, NP   1 mg at 05/03/24 1156   Followed by   NOREEN ON 05/04/2024] LORazepam  (ATIVAN ) tablet 1 mg  1 mg Oral BID Arloa Suzen RAMAN, NP       Followed by   NOREEN ON 05/05/2024] LORazepam  (ATIVAN ) tablet 1 mg  1 mg Oral Daily Arloa Suzen RAMAN, NP       magnesium  hydroxide (MILK OF MAGNESIA) suspension 30 mL  30 mL Oral Daily PRN Arloa Suzen RAMAN, NP       OLANZapine  (ZYPREXA ) tablet 5 mg  5 mg Oral BID Arloa Suzen RAMAN, NP   5 mg at 05/03/24 9182   ondansetron  (ZOFRAN -ODT) disintegrating tablet 4 mg  4 mg Oral Q6H PRN Arloa Suzen RAMAN, NP       thiamine  (Vitamin B-1) tablet 100 mg  100 mg Oral Daily Arloa Suzen RAMAN, NP   100 mg at 05/03/24 9182   traZODone  (DESYREL ) tablet 50 mg  50 mg Oral QHS PRN Arloa Suzen RAMAN, NP       PTA Medications: No medications prior to admission.    Musculoskeletal: Strength & Muscle Tone: within normal limits Gait & Station: normal Patient leans: N/A    Psychiatric Specialty Exam:  Presentation  General Appearance: Disheveled   Eye Contact:Fair   Speech:Clear and Coherent   Speech  Volume:Decreased   Handedness:Right   Mood and Affect  Mood:Anxious   Affect:Congruent    Thought Process  Thought Processes:Coherent   Duration of Psychotic Symptoms: N/A  Past Diagnosis of Schizophrenia or Psychoactive disorder: No  Descriptions of Associations:Tangential   Orientation:Partial   Thought Content:WDL   Hallucinations:Hallucinations: Auditory   Ideas of Reference:Paranoia; Delusions   Suicidal Thoughts:Suicidal Thoughts: No   Homicidal Thoughts:Homicidal Thoughts: No    Sensorium  Memory:Immediate Good; Recent Good; Remote Fair   Judgment:Impaired   Insight:Poor    Executive Functions  Concentration:Poor   Attention Span:Poor   Recall:Poor   Fund of Knowledge:Poor   Language:Poor    Psychomotor Activity  Psychomotor Activity:Psychomotor Activity: Normal    Assets  Assets:Physical Health    Sleep  Sleep:Sleep: Fair Number of Hours of Sleep: 6     Physical Exam:  General: Well developed, well nourished, Caucasian male Pupils: Normal at 3mm Respiratory: Breathing is unlabored.  Cardiovascular: No edema.  Language: No anomia, no aphasia Muscle strength and tone-pt moving all extremities.  Gait not assessed as pt remained in bed.  Neuro: Facial muscles are symmetric. Pt without tremor, no evidence of hyperarousal.  Review of Systems  Constitutional: Negative.   Eyes: Negative.   Respiratory: Negative.    Cardiovascular: Negative.   Gastrointestinal: Negative.   Genitourinary: Negative.   Musculoskeletal: Negative.   Skin: Negative.   Neurological: Negative.   Endo/Heme/Allergies: Negative.   Psychiatric/Behavioral:  Positive for depression, hallucinations and substance abuse. The patient is nervous/anxious.    Blood pressure 130/81, pulse 79, temperature 97.8 F (36.6 C), temperature source Oral, resp. rate 16, height 5' 9 (1.753 m), weight 83.9 kg, SpO2 100%. Body mass index is 27.32  kg/m.   ASSESSMENT: Active Problems:   Bipolar I disorder, current or most recent episode depressed, with psychotic features (HCC)    49 year old male with a history of polysubstance abuse (alcohol, marijuana, and cocaine) who was brought voluntarily to [facility] by  [employer] for evaluation of psychosis.   ON evaluation today, patient continues to present with symptoms of psychosis although improving and more conversant. Reports history of bipolar disorder and being depressed for the past week with psychosis. Patient is interested in starting Prozac  in combination with Zyprexa  which he states has already been helping. Patinet paranoid and AH. Patient denies SI or HI today. He is clearly anxious and in distress.   Patient was confused the past 2 days but now orieneted x 3. NO longer states he was using any other substance except alcohol recently but previously reported cocaine and cannabis. UDS + only for cannabis  [facility] day 1.   Treatment Plan Summary: Daily contact with patient to assess and evaluate symptoms and progress in treatment, Medication management, and Plan as below  Observation Level/Precautions:  15 minute checks  Laboratory:  CBC Chemistry Profile Folic Acid  HbAIC UDS UA Vitamin B-12  Psychotherapy:    Medications:  Zyprexa , trazodone   Consultations:    Discharge Concerns:  psychosis  Estimated LOS:5-7 days  Other:     Safety and Monitoring: voluntarily admission to inpatient psychiatric unit for safety, stabilization and treatment Daily contact with patient to assess and evaluate symptoms and progress in treatment Patient's case to be discussed in multi-disciplinary team meeting Observation Level : q15 minute checks Vital signs: q12 hours Precautions: suicide, elopement, and assault  2. Psychiatric Problems #Bipolar I disorder, current episode depressed with psychotic features (rule out substance induced mood disorder) -cont Zyprexa  to 5 mg BID -start Prozac  20 mg  qdaily -cont trazodone  50 mg at bedtime-PRN  #alcohol dependence with withdrawal -patient reports drinking heavily but denies history of DTS or seizures -continue Ativan  taper with CIWA + PRN ATivan    3. Medical Management Covid negative CMP: wnl CBC: unremarkable EtOH: <10 UDS: + THC TSH: wnl A1C: ordered for AM Lipids: ordered for AM  Ordered RPR and HIV to rule out neurosyphilis  Physician Treatment Plan for Primary Diagnosis: MDD (major depressive disorder), recurrent, severe, with psychosis (HCC) Long Term Goal(s): Improvement in symptoms so as ready for discharge  Short Term Goals: Ability to identify changes in lifestyle to reduce recurrence of condition will improve, Ability to verbalize feelings will improve, Ability to disclose and discuss suicidal ideas, Ability to demonstrate self-control will improve, Ability to identify and develop effective coping behaviors will improve, Ability to maintain clinical measurements within normal limits will improve, Compliance with prescribed medications will improve, and Ability to identify triggers associated with substance abuse/mental health issues will improve     I certify that inpatient services furnished can reasonably be expected to improve the patient's condition.    Tayten Bergdoll, MD 8/10/20254:30 PM

## 2024-05-03 NOTE — Progress Notes (Signed)
   05/03/24 2304  Psych Admission Type (Psych Patients Only)  Admission Status Voluntary  Psychosocial Assessment  Patient Complaints Depression  Eye Contact Brief  Facial Expression Flat  Affect Appropriate to circumstance  Speech Logical/coherent  Interaction Assertive  Motor Activity Slow (utilzies walker)  Appearance/Hygiene Disheveled;In scrubs  Behavior Characteristics Appropriate to situation  Mood Depressed  Thought Process  Coherency Disorganized  Content WDL  Delusions None reported or observed  Perception WDL  Hallucination None reported or observed  Judgment Poor  Confusion None  Danger to Self  Current suicidal ideation? Denies  Agreement Not to Harm Self Yes  Danger to Others  Danger to Others None reported or observed

## 2024-05-04 ENCOUNTER — Encounter (HOSPITAL_COMMUNITY): Payer: Self-pay

## 2024-05-04 LAB — LIPID PANEL
Cholesterol: 182 mg/dL (ref 0–200)
HDL: 52 mg/dL (ref >40)
LDL Cholesterol: 113 mg/dL — ABNORMAL HIGH (ref 0–99)
Total CHOL/HDL Ratio: 3.5 ratio
Triglycerides: 84 mg/dL (ref <150)
VLDL: 17 mg/dL (ref 0–40)

## 2024-05-04 LAB — RPR: RPR Ser Ql: NONREACTIVE

## 2024-05-04 LAB — HIV ANTIBODY (ROUTINE TESTING W REFLEX): HIV Screen 4th Generation wRfx: NONREACTIVE

## 2024-05-04 NOTE — Plan of Care (Signed)
   Problem: Activity: Goal: Interest or engagement in activities will improve Outcome: Progressing   Problem: Coping: Goal: Ability to verbalize frustrations and anger appropriately will improve Outcome: Progressing   Problem: Safety: Goal: Periods of time without injury will increase Outcome: Progressing

## 2024-05-04 NOTE — BH IP Treatment Plan (Signed)
 Interdisciplinary Treatment and Diagnostic Plan Update  05/04/2024 Time of Session: 10:55am Richard Mclean. MRN: [id number]  Principal Diagnosis: MDD (major depressive disorder), recurrent, severe, with psychosis (HCC)  Secondary Diagnoses: Active Problems:   Bipolar I disorder, current or most recent episode depressed, with psychotic features (HCC)   Current Medications:  Current Facility-Administered Medications  Medication Dose Route Frequency Provider Last Rate Last Admin   acetaminophen  (TYLENOL ) tablet 650 mg  650 mg Oral Q6H PRN Arloa Suzen RAMAN, NP       alum & mag hydroxide-simeth (MAALOX/MYLANTA) 200-200-20 MG/5ML suspension 30 mL  30 mL Oral Q4H PRN Arloa Suzen RAMAN, NP       haloperidol  (HALDOL ) tablet 5 mg  5 mg Oral TID PRN Arloa Suzen RAMAN, NP       And   diphenhydrAMINE  (BENADRYL ) capsule 50 mg  50 mg Oral TID PRN Arloa Suzen RAMAN, NP       haloperidol  lactate (HALDOL ) injection 5 mg  5 mg Intramuscular TID PRN Arloa Suzen RAMAN, NP       And   diphenhydrAMINE  (BENADRYL ) injection 50 mg  50 mg Intramuscular TID PRN Arloa Suzen RAMAN, NP       And   LORazepam  (ATIVAN ) injection 2 mg  2 mg Intramuscular TID PRN Arloa Suzen RAMAN, NP       haloperidol  lactate (HALDOL ) injection 10 mg  10 mg Intramuscular TID PRN Arloa Suzen RAMAN, NP       And   diphenhydrAMINE  (BENADRYL ) injection 50 mg  50 mg Intramuscular TID PRN Arloa Suzen RAMAN, NP       And   LORazepam  (ATIVAN ) injection 2 mg  2 mg Intramuscular TID PRN Arloa Suzen RAMAN, NP       FLUoxetine  (PROZAC ) capsule 20 mg  20 mg Oral Daily Zouev, Dmitri, MD   20 mg at 05/04/24 0827   hydrOXYzine  (ATARAX ) tablet 25 mg  25 mg Oral Q6H PRN Arloa Suzen RAMAN, NP   25 mg at 05/03/24 2104   LORazepam  (ATIVAN ) tablet 1 mg  1 mg Oral Q6H PRN Arloa Suzen RAMAN, NP       LORazepam  (ATIVAN ) tablet 1 mg  1 mg Oral BID Arloa Suzen RAMAN, NP   1 mg at 05/04/24 0827   Followed by   NOREEN ON 05/05/2024] LORazepam   (ATIVAN ) tablet 1 mg  1 mg Oral Daily Arloa Suzen RAMAN, NP       magnesium  hydroxide (MILK OF MAGNESIA) suspension 30 mL  30 mL Oral Daily PRN Arloa Suzen RAMAN, NP       OLANZapine  (ZYPREXA ) tablet 5 mg  5 mg Oral BID Arloa Suzen RAMAN, NP   5 mg at 05/04/24 0827   ondansetron  (ZOFRAN -ODT) disintegrating tablet 4 mg  4 mg Oral Q6H PRN Arloa Suzen RAMAN, NP       thiamine  (Vitamin B-1) tablet 100 mg  100 mg Oral Daily Arloa Suzen RAMAN, NP   100 mg at 05/04/24 0827   traZODone  (DESYREL ) tablet 50 mg  50 mg Oral QHS PRN Arloa Suzen RAMAN, NP   50 mg at 05/03/24 2104   PTA Medications: No medications prior to admission.    Patient Stressors: Medication change or noncompliance   Substance abuse   Other: homelessness    Patient Strengths: Printmaker for treatment/growth  Physical Health  Supportive family/friends   Treatment Modalities: Medication Management, Group therapy, Case management,  1 to 1 session with clinician, Psychoeducation, Recreational therapy.  Physician Treatment Plan for Primary Diagnosis: MDD (major depressive disorder), recurrent, severe, with psychosis (HCC) Long Term Goal(s): Improvement in symptoms so as ready for discharge   Short Term Goals: Ability to identify changes in lifestyle to reduce recurrence of condition will improve Ability to verbalize feelings will improve Ability to disclose and discuss suicidal ideas Ability to demonstrate self-control will improve Ability to identify and develop effective coping behaviors will improve Ability to maintain clinical measurements within normal limits will improve Compliance with prescribed medications will improve Ability to identify triggers associated with substance abuse/mental health issues will improve  Medication Management: Evaluate patient's response, side effects, and tolerance of medication regimen.  Therapeutic Interventions: 1 to 1 sessions, Unit Group sessions and  Medication administration.  Evaluation of Outcomes: Not Progressing  Physician Treatment Plan for Secondary Diagnosis: Active Problems:   Bipolar I disorder, current or most recent episode depressed, with psychotic features (HCC)  Long Term Goal(s): Improvement in symptoms so as ready for discharge   Short Term Goals: Ability to identify changes in lifestyle to reduce recurrence of condition will improve Ability to verbalize feelings will improve Ability to disclose and discuss suicidal ideas Ability to demonstrate self-control will improve Ability to identify and develop effective coping behaviors will improve Ability to maintain clinical measurements within normal limits will improve Compliance with prescribed medications will improve Ability to identify triggers associated with substance abuse/mental health issues will improve     Medication Management: Evaluate patient's response, side effects, and tolerance of medication regimen.  Therapeutic Interventions: 1 to 1 sessions, Unit Group sessions and Medication administration.  Evaluation of Outcomes: Not Progressing   RN Treatment Plan for Primary Diagnosis: MDD (major depressive disorder), recurrent, severe, with psychosis (HCC) Long Term Goal(s): Knowledge of disease and therapeutic regimen to maintain health will improve  Short Term Goals: Ability to remain free from injury will improve, Ability to verbalize frustration and anger appropriately will improve, Ability to demonstrate self-control, Ability to participate in decision making will improve, Ability to verbalize feelings will improve, Ability to disclose and discuss suicidal ideas, and Compliance with prescribed medications will improve  Medication Management: RN will administer medications as ordered by provider, will assess and evaluate patient's response and provide education to patient for prescribed medication. RN will report any adverse and/or side effects to  prescribing provider.  Therapeutic Interventions: 1 on 1 counseling sessions, Psychoeducation, Medication administration, Evaluate responses to treatment, Monitor vital signs and CBGs as ordered, Perform/monitor CIWA, COWS, AIMS and Fall Risk screenings as ordered, Perform wound care treatments as ordered.  Evaluation of Outcomes: Not Progressing   LCSW Treatment Plan for Primary Diagnosis: MDD (major depressive disorder), recurrent, severe, with psychosis (HCC) Long Term Goal(s): Safe transition to appropriate next level of care at discharge, Engage patient in therapeutic group addressing interpersonal concerns.  Short Term Goals: Engage patient in aftercare planning with referrals and resources, Increase social support, Increase ability to appropriately verbalize feelings, Increase emotional regulation, Facilitate acceptance of mental health diagnosis and concerns, Facilitate patient progression through stages of change regarding substance use diagnoses and concerns, and Identify triggers associated with mental health/substance abuse issues  Therapeutic Interventions: Assess for all discharge needs, 1 to 1 time with Social worker, Explore available resources and support systems, Assess for adequacy in community support network, Educate family and significant other(s) on suicide prevention, Complete Psychosocial Assessment, Interpersonal group therapy.  Evaluation of Outcomes: Not Progressing   Progress in Treatment: Attending groups: Yes. Participating in groups: Yes. Taking medication  as prescribed: Yes. Toleration medication: Yes. Family/Significant other contact made: No, will contact:  Leonor, mother [phone] Patient understands diagnosis: Yes. Discussing patient identified problems/goals with staff: Yes. Medical problems stabilized or resolved: Yes. Denies suicidal/homicidal ideation: Yes. Issues/concerns per patient self-inventory: No. Patient Goals:  finding somewhere to  go  Discharge Plan or Barriers: Barriers include adequate housing and lack of income.  Reason for Continuation of Hospitalization: Medication stabilization  Estimated Length of Stay: 5-7 days  Last 3 [place] Suicide Severity Risk Score: Flowsheet Row Admission (Current) [facility] from 05/01/2024 in [facility] ED from 04/16/2024 in [facility]  C-SSRS RISK CATEGORY No Risk No Risk No Risk    Last PHQ 2/9 Scores:    10/31/2017    2:56 PM  Depression screen PHQ 2/9  Decreased Interest 3  Down, Depressed, Hopeless 3  PHQ - 2 Score 6  Altered sleeping 2  Tired, decreased energy 3  Change in appetite 0  Feeling bad or failure about yourself  3  Trouble concentrating 3  Moving slowly or fidgety/restless 3  Suicidal thoughts 0  PHQ-9 Score 20    Scribe for Treatment Team: Justyna Timoney M Leisa Gault, LCSWA 05/04/2024 3:25 PM

## 2024-05-04 NOTE — Progress Notes (Signed)
 [facility] MD Progress Note  05/04/2024 4:38 PM Richard Mclean.  MRN:  [id number] Subjective:   48 year old Caucasian male, divorced, homeless, unemployed.  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  Presented by EMS and [facility] on account of odd and bizarre behavior.  Reported to be responding to internal stimuli at presentation.  Reported to be checking on pulse repeatedly at presentation.  Expressed belief that his cigarette was contaminated with radiation. Routine labs were essentially normal.  UDS was positive for THC.  Chart reviewed today.  Patient discussed at multidisciplinary team meeting.  Nursing staff reports that he slept for 13.5 hours.  He is dismissive of all symptoms were improved.  He however is noticed to be disorganized sometimes.  He has been adherent with his medication.  No PRNs required.  I met with the patient for the first time today.  He tells me that he was married for less than a year.  States that his wife left without leaving a note.  He does not have any kids.  Patient states that he gets food stamps and no other support structure.  States that he has been chronically homeless.  States that his homelessness makes him sleep deprived as he has to look for a place to sleep.  He does not feel comfortable with homeless people as he is worried that they could contaminate his cigarette or food.  States that he has seen it all his has been very cautious.  Patient states that he feels better in here as this is a safe environment.  He has been able to sleep here.  He is not endorsing any persecution in here.  He is not endorsing any hallucination in here.  States that his goal is to get into a halfway house or a group home.  He has also filed for [employer] disability income which he hopes to get.  No thoughts of suicide.  No violent thoughts towards others or to property.   Principal Problem: MDD  (major depressive disorder), recurrent, severe, with psychosis (HCC) Diagnosis: Active Problems:   Bipolar I disorder, current or most recent episode depressed, with psychotic features (HCC)  Total Time spent with patient: 20 minutes  Past Psychiatric History:  Previous Psych Diagnoses: MDD, Bipolar (per patient), Alcohol Use Disorder, Cannibus Use, and Cocaine Use  Prior inpatient treatment: denies Current/prior outpatient treatment:  denies Psychotherapy [id number] History of suicide: denies History of homicide: denies Psychiatric medication history:denies Psychiatric medication compliance history:denies     Substance Abuse Hx: Alcohol: reports 6 beers a day Tobacco: denies Illicit drugs: reports cocaine use last week what I can get my hands on Rx drug abuse: denies Rehab hx: denies  Past Medical History:  Past Medical History:  Diagnosis Date   Depression    History reviewed. No pertinent surgical history. Family History:  Family History  Problem Relation Age of Onset   Alcohol abuse Mother    Depression Mother    Mental illness Mother    Mental illness Father    Mental illness Sister    HIV/AIDS Brother    Mental illness Maternal Grandmother    Family Psychiatric  History:  Reports extensive family history of mental health disorder with mother, father and sister both suffering but is unable to further elaborate. Denies suicide in family   Social History:  Social History   Substance and Sexual Activity  Alcohol Use Yes   Comment: Malt liquor or liquor every  other weekend     Social History   Substance and Sexual Activity  Drug Use Not Currently   Types: Marijuana    Social History   Socioeconomic History   Marital status: Single    Spouse name: Not on file   Number of children: 0   Years of education: GED   Highest education level: GED or equivalent  Occupational History   Occupation: Unemployed  Tobacco Use   Smoking status: Every Day    Types:  Cigarettes   Smokeless tobacco: Never  Vaping Use   Vaping status: Every Day  Substance and Sexual Activity   Alcohol use: Yes    Comment: Malt liquor or liquor every other weekend   Drug use: Not Currently    Types: Marijuana   Sexual activity: Not on file  Other Topics Concern   Not on file  Social History Narrative   ** Merged History Encounter **       Completed social determinants screening and administered PHQ 9 along with Biopsychosocial assessment.    Social Drivers of Health   Financial Resource Strain: High Risk (05/04/2019)   Overall Financial Resource Strain (CARDIA)    Difficulty of Paying Living Expenses: Hard  Food Insecurity: Food Insecurity Present (05/02/2024)   Hunger Vital Sign    Worried About Running Out of Food in the Last Year: Sometimes true    Ran Out of Food in the Last Year: Sometimes true  Transportation Needs: No Transportation Needs (05/02/2024)   PRAPARE - Administrator, Civil Service (Medical): No    Lack of Transportation (Non-Medical): No  Physical Activity: Sufficiently Active (05/04/2019)   Exercise Vital Sign    Days of Exercise per Week: 7 days    Minutes of Exercise per Session: 30 min  Stress: Stress Concern Present (05/04/2019)   [employer] of Occupational Health - Occupational Stress Questionnaire    Feeling of Stress : Very much  Social Connections: Socially Isolated (05/04/2019)   Social Connection and Isolation Panel    Frequency of Communication with Friends and Family: Once a week    Frequency of Social Gatherings with Friends and Family: Never    Attends Religious Services: Never    Database administrator or Organizations: No    Attends Engineer, structural: Never    Marital Status: Never married     Current Medications: Current Facility-Administered Medications  Medication Dose Route Frequency Provider Last Rate Last Admin   acetaminophen  (TYLENOL ) tablet 650 mg  650 mg Oral Q6H PRN Arloa Suzen RAMAN, NP       alum & mag hydroxide-simeth (MAALOX/MYLANTA) 200-200-20 MG/5ML suspension 30 mL  30 mL Oral Q4H PRN Arloa Suzen RAMAN, NP       haloperidol  (HALDOL ) tablet 5 mg  5 mg Oral TID PRN Arloa Suzen RAMAN, NP       And   diphenhydrAMINE  (BENADRYL ) capsule 50 mg  50 mg Oral TID PRN Arloa Suzen RAMAN, NP       haloperidol  lactate (HALDOL ) injection 5 mg  5 mg Intramuscular TID PRN Arloa Suzen RAMAN, NP       And   diphenhydrAMINE  (BENADRYL ) injection 50 mg  50 mg Intramuscular TID PRN Arloa Suzen RAMAN, NP       And   LORazepam  (ATIVAN ) injection 2 mg  2 mg Intramuscular TID PRN Arloa Suzen RAMAN, NP       haloperidol  lactate (HALDOL ) injection 10 mg  10 mg Intramuscular TID  PRN Arloa Suzen RAMAN, NP       And   diphenhydrAMINE  (BENADRYL ) injection 50 mg  50 mg Intramuscular TID PRN Arloa Suzen RAMAN, NP       And   LORazepam  (ATIVAN ) injection 2 mg  2 mg Intramuscular TID PRN Arloa Suzen RAMAN, NP       FLUoxetine  (PROZAC ) capsule 20 mg  20 mg Oral Daily Zouev, Dmitri, MD   20 mg at 05/04/24 0827   hydrOXYzine  (ATARAX ) tablet 25 mg  25 mg Oral Q6H PRN Arloa Suzen RAMAN, NP   25 mg at 05/03/24 2104   LORazepam  (ATIVAN ) tablet 1 mg  1 mg Oral Q6H PRN Arloa Suzen RAMAN, NP       LORazepam  (ATIVAN ) tablet 1 mg  1 mg Oral BID Arloa Suzen RAMAN, NP   1 mg at 05/04/24 0827   Followed by   NOREEN ON 05/05/2024] LORazepam  (ATIVAN ) tablet 1 mg  1 mg Oral Daily Arloa Suzen RAMAN, NP       magnesium  hydroxide (MILK OF MAGNESIA) suspension 30 mL  30 mL Oral Daily PRN Arloa Suzen RAMAN, NP       OLANZapine  (ZYPREXA ) tablet 5 mg  5 mg Oral BID Arloa Suzen RAMAN, NP   5 mg at 05/04/24 0827   ondansetron  (ZOFRAN -ODT) disintegrating tablet 4 mg  4 mg Oral Q6H PRN Arloa Suzen RAMAN, NP       thiamine  (Vitamin B-1) tablet 100 mg  100 mg Oral Daily Arloa Suzen RAMAN, NP   100 mg at 05/04/24 0827   traZODone  (DESYREL ) tablet 50 mg  50 mg Oral QHS PRN Arloa Suzen RAMAN, NP   50 mg at  05/03/24 2104    Lab Results:  Results for orders placed or performed during the hospital encounter of 05/02/24 (from the past 48 hours)  RPR     Status: None   Collection Time: 05/04/24  6:21 AM  Result Value Ref Range   RPR Ser Ql NON REACTIVE NON REACTIVE    Comment: Performed at [facility], [address], [place], [place] [address]  Lipid panel     Status: Abnormal   Collection Time: 05/04/24  6:21 AM  Result Value Ref Range   Cholesterol 182 0 - 200 mg/dL   Triglycerides 84 <849 mg/dL   HDL 52 >59 mg/dL   Total CHOL/HDL Ratio 3.5 RATIO   VLDL 17 0 - 40 mg/dL   LDL Cholesterol 886 (H) 0 - 99 mg/dL    Comment:        Total Cholesterol/HDL:CHD Risk Coronary Heart Disease Risk Table                     Men   Women  1/2 Average Risk   3.4   3.3  Average Risk       5.0   4.4  2 X Average Risk   9.6   7.1  3 X Average Risk  23.4   11.0        Use the calculated Patient Ratio above and the CHD Risk Table to determine the patient's CHD Risk.        ATP III CLASSIFICATION (LDL):  <100     mg/dL   Optimal  899-870  mg/dL   Near or Above                    Optimal  130-159  mg/dL   Borderline  839-810  mg/dL   High  >809     mg/dL   Very High Performed at [facility], [address] [place], [place] [address]   HIV Antibody (routine testing w rflx)     Status: None   Collection Time: 05/04/24  6:21 AM  Result Value Ref Range   HIV Screen 4th Generation wRfx Non Reactive Non Reactive    Comment: Performed at [facility], [address], [place], [place] [address]    Blood Alcohol level:  Lab Results  Component Value Date   [facility] <15 05/02/2024   ETH 53 (H) 02/16/2024    Metabolic Disorder Labs: No results found for: HGBA1C, MPG No results found for: PROLACTIN Lab Results  Component Value Date   CHOL 182 05/04/2024   TRIG 84 05/04/2024   HDL 52 05/04/2024   CHOLHDL 3.5 05/04/2024   VLDL 17 05/04/2024   LDLCALC 113 (H)  05/04/2024   LDLCALC 135 (H) 05/02/2024    Physical Findings: AIMS:  ,  ,  ,  ,  ,  ,   CIWA:  CIWA-Ar Total: 0 COWS:     Musculoskeletal: Strength & Muscle Tone: within normal limits Gait & Station: normal Patient leans: N/A  Psychiatric Specialty Exam:  Presentation  General Appearance and behavior:  In hospital clothing, not in any distress, not internally preoccupied, appropriate behavior.  Eye Contact: Good.  Speech: Spontaneous.  Normal rate, tone and volume.   Mood and Affect  Mood: Euthymic.  Affect: Restricted and appropriate.  Thought Process  Thought Processes: Linear and goal directed.  Descriptions of Associations:Intact  Orientation:Full (Time, Place and Person)  Thought Content: Future oriented.  No current suicidal thoughts.  No homicidal thoughts.  No thoughts of violence.  No negative ruminative flooding.  No guilty ruminations.  No delusional theme.  No obsessions.  Hallucinations: No hallucination in any modality.  Sensorium  Memory: Good.  Judgment: Good.  Insight: Limited as he is not interested in dealing with his addiction.  He is willing to take psychotropic medication.  Executive Functions  Concentration: Good.  Attention Span: Good.  Recall: Good.  Fund of Knowledge: Good.  Language: Good   Psychomotor Activity  Normal psychomotor activity    Physical Exam: Physical Exam ROS Blood pressure 131/83, pulse 79, temperature 98.5 F (36.9 C), temperature source Oral, resp. rate 18, height 5' 9 (1.753 m), weight 83.9 kg, SpO2 100%. Body mass index is 27.32 kg/m.   Treatment Plan Summary: Patient with history of chronic homelessness and substance use disorder.  Presented intoxicated with THC.  Presented with positive symptoms of psychosis.  He seems to have cleared after getting a good sleep on the unit.  He is not endorsing any current dangerousness.  We will continue his regimen and evaluate him  further.  1.  Continue fluoxetine  20 mg daily. 2.  Continue CIWA protocol 3.  Continue to monitor mood behavior and interaction with others. 4.  Continue to encourage unit groups and therapeutic activities. 5.  Continue to motivate patient towards addiction treatment. 6.  Social worker will coordinate discharge and aftercare planning.   Jerrell DELENA Forehand, MD 05/04/2024, 4:38 PM

## 2024-05-04 NOTE — Plan of Care (Signed)
   Problem: Education: Goal: Verbalization of understanding the information provided will improve Outcome: Progressing   Problem: Activity: Goal: Interest or engagement in activities will improve Outcome: Progressing

## 2024-05-04 NOTE — Progress Notes (Signed)
(  Sleep Hours) - 13.75 hours (Any PRNs that were needed, meds refused, or side effects to meds)- Vistaril  25, Trazodone  50 mg.  Pt up frequently during night, slept during day (Any disturbances and when (visitation, over night)- Up frequently (Concerns raised by the patient)-  None (SI/HI/AVH)-  Denies

## 2024-05-04 NOTE — Progress Notes (Signed)
 Darris did not attend wrap up group.

## 2024-05-04 NOTE — [facility] Group Notes (Signed)
 Spiritual care group on grief and loss facilitated by Chaplain Rockie Sofia, [facility]  Group Goal: Support / Education around grief and loss  Members engage in facilitated group support and psycho-social education.  Group Description:  Following introductions and group rules, group members engaged in facilitated group dialogue and support around topic of loss, with particular support around experiences of loss in their lives. Group Identified types of loss (relationships / self / things) and identified patterns, circumstances, and changes that precipitate losses. Reflected on thoughts / feelings around loss, normalized grief responses, and recognized variety in grief experience. Group encouraged individual reflection on safe space and on the coping skills that they are already utilizing.  Group drew on Adlerian / Rogerian and narrative framework  Patient Progress: Pt attended the last part of group.  He did not participate verbally, but demonstrated engagement in the group conversation.

## 2024-05-04 NOTE — Group Note (Signed)
 Date:  05/04/2024 Time:  10:01 AM  Group Topic/Focus:  Goals Group:   The focus of this group is to help patients establish daily goals to achieve during treatment and discuss how the patient can incorporate goal setting into their daily lives to aide in recovery.    Participation Level:  Minimal  Participation Quality:  Appropriate  Affect:  Appropriate  Cognitive:  Appropriate  Insight: Appropriate  Engagement in Group:  Improving  Modes of Intervention:  Discussion  Additional Comments:  Pt quietly listened during group.  Kamdyn Colborn A Amilio Zehnder 05/04/2024, 10:01 AM

## 2024-05-04 NOTE — Progress Notes (Signed)
   05/04/24 0800  Psych Admission Type (Psych Patients Only)  Admission Status Voluntary  Psychosocial Assessment  Patient Complaints None  Eye Contact Fair  Facial Expression Other (Comment) (Bright.)  Affect Appropriate to circumstance  Speech Logical/coherent  Interaction Assertive  Motor Activity Slow  Appearance/Hygiene Improved  Behavior Characteristics Appropriate to situation  Mood Depressed  Thought Process  Coherency Disorganized  Content WDL  Delusions None reported or observed  Perception WDL  Hallucination None reported or observed  Judgment Poor  Confusion None  Danger to Self  Current suicidal ideation? Denies  Agreement Not to Harm Self Yes  Danger to Others  Danger to Others None reported or observed

## 2024-05-05 LAB — HEMOGLOBIN A1C
Hgb A1c MFr Bld: 5.4 % (ref 4.8–5.6)
Mean Plasma Glucose: 108 mg/dL

## 2024-05-05 MED ORDER — OLANZAPINE 10 MG PO TABS
10.0000 mg | ORAL_TABLET | Freq: Every day | ORAL | Status: DC
  Administered 2024-05-05 – 2024-05-06 (×4): 10 mg via ORAL
  Filled 2024-05-05 (×2): qty 1

## 2024-05-05 NOTE — Plan of Care (Signed)
  Problem: Education: Goal: Verbalization of understanding the information provided will improve Outcome: Progressing   Problem: Activity: Goal: Sleeping patterns will improve Outcome: Progressing   Problem: Coping: Goal: Ability to verbalize frustrations and anger appropriately will improve Outcome: Progressing   

## 2024-05-05 NOTE — Progress Notes (Signed)
  Richard Mclean.   Type of Note: [employer] was given [employer] with vacancies. Was encouraged to call to schedule/complete interviews. Pt was agreeable.  Signed:  Anisia Leija, LCSW-A 05/05/2024  3:10 PM

## 2024-05-05 NOTE — [facility] Suicide Risk Assessment (Signed)
 [facility] INPATIENT:  Family/Significant Other Suicide Prevention Education  Suicide Prevention Education:  Education Completed; Rylon Poitra, Mother, [phone],  (name of family member/significant other) has been identified by the patient as the family member/significant other with whom the patient will be residing, and identified as the person(s) who will aid the patient in the event of a mental health crisis (suicidal ideations/suicide attempt).  With written consent from the patient, the family member/significant other has been provided the following suicide prevention education, prior to the and/or following the discharge of the patient.  Pt's mother reports she would like patient to discharge to her home. Pt would need a ride there, mother does not drive. Mother will ensure pt follows up with outpatient provider, will arrange a ride for pt to get to [facility] through transportation service she uses.   Does not have access to weapons or firearms.   The suicide prevention education provided includes the following: Suicide risk factors Suicide prevention and interventions National Suicide Hotline telephone number [facility] assessment telephone number [facility] Emergency Assistance 911 [facility] and/or Residential Mobile Crisis Unit telephone number  Request made of family/significant other to: Remove weapons (e.g., guns, rifles, knives), all items previously/currently identified as safety concern.   Remove drugs/medications (over-the-counter, prescriptions, illicit drugs), all items previously/currently identified as a safety concern.  The family member/significant other verbalizes understanding of the suicide prevention education information provided.  The family member/significant other agrees to remove the items of safety concern listed above.  Jenkins LULLA Primer 05/05/2024, 3:22 PM

## 2024-05-05 NOTE — Progress Notes (Signed)
 [facility] MD Progress Note  05/05/2024 3:23 PM Richard Mclean.  MRN:  [id number] Subjective:   48 year old Caucasian male, divorced, homeless, unemployed.  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  Presented by EMS and [facility] on account of odd and bizarre behavior.  Reported to be responding to internal stimuli at presentation.  Reported to be checking on pulse repeatedly at presentation.  Expressed belief that his cigarette was contaminated with radiation. Routine labs were essentially normal.  UDS was positive for THC.  Chart reviewed today.  Patient discussed at multidisciplinary team meeting.  Nursing staff reports that patient has been appropriate on the unit.  No observed response to internal stimuli.  He has been taking his medications as recommended.  No PRNs required lately.  He slept for 8.25 hours.  He is participating with unit groups and therapeutic activities.  Seen today.  Not endorsing any worries or any concerns.  Not reporting any adverse effects from his medication.  He feels content in this environment.  No self-injurious thoughts.  No residual thoughts towards others of the property.  No evidence of mania.  No evidence of psychosis.  No adverse effects from his medications. Encouraged to keep ventilating his feelings to staff.  Principal Problem: MDD (major depressive disorder), recurrent, severe, with psychosis (HCC) Diagnosis: Active Problems:   Bipolar I disorder, current or most recent episode depressed, with psychotic features (HCC)  Total Time spent with patient: 20 minutes  Past Psychiatric History:  Previous Psych Diagnoses: MDD, Bipolar (per patient), Alcohol Use Disorder, Cannibus Use, and Cocaine Use  Prior inpatient treatment: denies Current/prior outpatient treatment:  denies Psychotherapy [id number] History of suicide: denies History of homicide: denies Psychiatric medication  history:denies Psychiatric medication compliance history:denies     Substance Abuse Hx: Alcohol: reports 6 beers a day Tobacco: denies Illicit drugs: reports cocaine use last week what I can get my hands on Rx drug abuse: denies Rehab hx: denies  Past Medical History:  Past Medical History:  Diagnosis Date   Depression    History reviewed. No pertinent surgical history. Family History:  Family History  Problem Relation Age of Onset   Alcohol abuse Mother    Depression Mother    Mental illness Mother    Mental illness Father    Mental illness Sister    HIV/AIDS Brother    Mental illness Maternal Grandmother    Family Psychiatric  History:  Reports extensive family history of mental health disorder with mother, father and sister both suffering but is unable to further elaborate. Denies suicide in family   Social History:  Social History   Substance and Sexual Activity  Alcohol Use Yes   Comment: Malt liquor or liquor every other weekend     Social History   Substance and Sexual Activity  Drug Use Not Currently   Types: Marijuana    Social History   Socioeconomic History   Marital status: Single    Spouse name: Not on file   Number of children: 0   Years of education: GED   Highest education level: GED or equivalent  Occupational History   Occupation: Unemployed  Tobacco Use   Smoking status: Every Day    Types: Cigarettes   Smokeless tobacco: Never  Vaping Use   Vaping status: Every Day  Substance and Sexual Activity   Alcohol use: Yes    Comment: Malt liquor or liquor every other weekend   Drug use: Not Currently  Types: Marijuana   Sexual activity: Not on file  Other Topics Concern   Not on file  Social History Narrative   ** Merged History Encounter **       Completed social determinants screening and administered PHQ 9 along with Biopsychosocial assessment.    Social Drivers of Health   Financial Resource Strain: High Risk (05/04/2019)    Overall Financial Resource Strain (CARDIA)    Difficulty of Paying Living Expenses: Hard  Food Insecurity: Food Insecurity Present (05/02/2024)   Hunger Vital Sign    Worried About Running Out of Food in the Last Year: Sometimes true    Ran Out of Food in the Last Year: Sometimes true  Transportation Needs: No Transportation Needs (05/02/2024)   PRAPARE - Administrator, Civil Service (Medical): No    Lack of Transportation (Non-Medical): No  Physical Activity: Sufficiently Active (05/04/2019)   Exercise Vital Sign    Days of Exercise per Week: 7 days    Minutes of Exercise per Session: 30 min  Stress: Stress Concern Present (05/04/2019)   [employer] of Occupational Health - Occupational Stress Questionnaire    Feeling of Stress : Very much  Social Connections: Socially Isolated (05/04/2019)   Social Connection and Isolation Panel    Frequency of Communication with Friends and Family: Once a week    Frequency of Social Gatherings with Friends and Family: Never    Attends Religious Services: Never    Database administrator or Organizations: No    Attends Engineer, structural: Never    Marital Status: Never married     Current Medications: Current Facility-Administered Medications  Medication Dose Route Frequency Provider Last Rate Last Admin   acetaminophen  (TYLENOL ) tablet 650 mg  650 mg Oral Q6H PRN Arloa Suzen RAMAN, NP       alum & mag hydroxide-simeth (MAALOX/MYLANTA) 200-200-20 MG/5ML suspension 30 mL  30 mL Oral Q4H PRN Arloa Suzen RAMAN, NP       haloperidol  (HALDOL ) tablet 5 mg  5 mg Oral TID PRN Arloa Suzen RAMAN, NP       And   diphenhydrAMINE  (BENADRYL ) capsule 50 mg  50 mg Oral TID PRN Arloa Suzen RAMAN, NP       haloperidol  lactate (HALDOL ) injection 5 mg  5 mg Intramuscular TID PRN Arloa Suzen RAMAN, NP       And   diphenhydrAMINE  (BENADRYL ) injection 50 mg  50 mg Intramuscular TID PRN Arloa Suzen RAMAN, NP       And   LORazepam  (ATIVAN )  injection 2 mg  2 mg Intramuscular TID PRN Arloa Suzen RAMAN, NP       haloperidol  lactate (HALDOL ) injection 10 mg  10 mg Intramuscular TID PRN Arloa Suzen RAMAN, NP       And   diphenhydrAMINE  (BENADRYL ) injection 50 mg  50 mg Intramuscular TID PRN Arloa Suzen RAMAN, NP       And   LORazepam  (ATIVAN ) injection 2 mg  2 mg Intramuscular TID PRN Arloa Suzen RAMAN, NP       FLUoxetine  (PROZAC ) capsule 20 mg  20 mg Oral Daily Zouev, Dmitri, MD   20 mg at 05/05/24 0840   magnesium  hydroxide (MILK OF MAGNESIA) suspension 30 mL  30 mL Oral Daily PRN Arloa Suzen RAMAN, NP       OLANZapine  (ZYPREXA ) tablet 10 mg  10 mg Oral QHS Jasiel Apachito, Jerrell LABOR, MD       thiamine  (Vitamin B-1) tablet 100  mg  100 mg Oral Daily Arloa Suzen RAMAN, NP   100 mg at 05/05/24 0840   traZODone  (DESYREL ) tablet 50 mg  50 mg Oral QHS PRN Arloa Suzen RAMAN, NP   50 mg at 05/04/24 2058    Lab Results:  Results for orders placed or performed during the hospital encounter of 05/02/24 (from the past 48 hours)  RPR     Status: None   Collection Time: 05/04/24  6:21 AM  Result Value Ref Range   RPR Ser Ql NON REACTIVE NON REACTIVE    Comment: Performed at [facility], [address] [place], [place] [address]  Hemoglobin A1c     Status: None   Collection Time: 05/04/24  6:21 AM  Result Value Ref Range   Hgb A1c MFr Bld 5.4 4.8 - 5.6 %    Comment: (NOTE)         Prediabetes: 5.7 - 6.4         Diabetes: >6.4         Glycemic control for adults with diabetes: <7.0    Mean Plasma Glucose 108 mg/dL    Comment: (NOTE) Performed At: [facility] [address] [place], [place] [id number] Jennette Shorter MD Ey:[id number]   Lipid panel     Status: Abnormal   Collection Time: 05/04/24  6:21 AM  Result Value Ref Range   Cholesterol 182 0 - 200 mg/dL   Triglycerides 84 <849 mg/dL   HDL 52 >59 mg/dL   Total CHOL/HDL Ratio 3.5 RATIO   VLDL 17 0 - 40 mg/dL   LDL Cholesterol 886 (H) 0 - 99 mg/dL     Comment:        Total Cholesterol/HDL:CHD Risk Coronary Heart Disease Risk Table                     Men   Women  1/2 Average Risk   3.4   3.3  Average Risk       5.0   4.4  2 X Average Risk   9.6   7.1  3 X Average Risk  23.4   11.0        Use the calculated Patient Ratio above and the CHD Risk Table to determine the patient's CHD Risk.        ATP III CLASSIFICATION (LDL):  <100     mg/dL   Optimal  899-870  mg/dL   Near or Above                    Optimal  130-159  mg/dL   Borderline  839-810  mg/dL   High  >809     mg/dL   Very High Performed at [facility], [address], [place], [place] [address]   HIV Antibody (routine testing w rflx)     Status: None   Collection Time: 05/04/24  6:21 AM  Result Value Ref Range   HIV Screen 4th Generation wRfx Non Reactive Non Reactive    Comment: Performed at [facility], [address], [place], [place] [address]    Blood Alcohol level:  Lab Results  Component Value Date   [facility] <15 05/02/2024   ETH 53 (H) 02/16/2024    Metabolic Disorder Labs: Lab Results  Component Value Date   HGBA1C 5.4 05/04/2024   MPG 108 05/04/2024   No results found for: PROLACTIN Lab Results  Component Value Date   CHOL 182  05/04/2024   TRIG 84 05/04/2024   HDL 52 05/04/2024   CHOLHDL 3.5 05/04/2024   VLDL 17 05/04/2024   LDLCALC 113 (H) 05/04/2024   LDLCALC 135 (H) 05/02/2024    Physical Findings: AIMS:  ,  ,  ,  ,  ,  ,   CIWA:  CIWA-Ar Total: 3 COWS:     Musculoskeletal: Strength & Muscle Tone: within normal limits Gait & Station: normal Patient leans: N/A  Psychiatric Specialty Exam:  Presentation  General Appearance and behavior:  Casually dressed, not in any distress, good relatedness.  No EPS.  Eye Contact: Good.  Speech: Spontaneous.  Normal rate, tone and volume.   Mood and Affect  Mood: Euthymic.  Affect: Mobilize and affect appropriately.  Thought Process  Thought  Processes: Linear and goal directed.  Descriptions of Associations:Intact  Orientation:Full (Time, Place and Person)  Thought Content: Future oriented.  No current suicidal thoughts.  No homicidal thoughts.  No thoughts of violence.  No negative ruminative flooding.  No guilty ruminations.  No delusional theme.  No obsessions.  Hallucinations: No hallucination in any modality.  Sensorium  Memory: Good.  Judgment: Good.  Insight: Limited as he is not interested in dealing with his addiction.  He is willing to take psychotropic medication.  Executive Functions  Concentration: Good.  Attention Span: Good.  Recall: Good.  Fund of Knowledge: Good.  Language: Good   Psychomotor Activity  Normal psychomotor activity    Physical Exam: Physical Exam ROS Blood pressure (!) 127/91, pulse 75, temperature 99.1 F (37.3 C), temperature source Oral, resp. rate 20, height 5' 9 (1.753 m), weight 83.9 kg, SpO2 99%. Body mass index is 27.32 kg/m.   Treatment Plan Summary: Patient is gradually responding to treatment.  No residual psychotic features.  No manic features.  No dangerousness.  Will continue his medication at the same dose.  We will continue to evaluate him.  1.  Continue fluoxetine  20 mg daily. 2.  Olanzapine  10 mg at bedtime. 3.  Continue to monitor mood behavior and interaction with others. 4.  Continue to encourage unit groups and therapeutic activities. 5.  Continue to motivate patient towards addiction treatment. 6.  Social worker will coordinate discharge and aftercare planning.   Jerrell DELENA Forehand, MD 05/05/2024, 3:23 PM

## 2024-05-05 NOTE — [facility] Group Notes (Signed)
 Adult Psychoeducational Group Note  Date:  05/05/2024 Time:  10:09 AM  Group Topic/Focus:  Goals Group:   The focus of this group is to help patients establish daily goals to achieve during treatment and discuss how the patient can incorporate goal setting into their daily lives to aide in recovery. Orientation:   The focus of this group is to educate the patient on the purpose and policies of crisis stabilization and provide a format to answer questions about their admission.  The group details unit policies and expectations of patients while admitted.  Participation Level:  Active  Participation Quality:  Appropriate  Affect:  Appropriate  Cognitive:  Appropriate  Insight: Appropriate  Engagement in Group:  Engaged  Modes of Intervention:  Discussion  Additional Comments:  Pt attended the goals group and remained appropriate and engaged throughout the duration of the group.   Mahir Prabhakar O 05/05/2024, 10:09 AM

## 2024-05-05 NOTE — Progress Notes (Signed)
(  Sleep Hours) - 5  (Any PRNs that were needed, meds refused, or side effects to meds)- Trazodone  for sleep - Effective  (Any disturbances and when (visitation, over night)- None  (Concerns raised by the patient)- None  (SI/HI/AVH)- Denies

## 2024-05-05 NOTE — Group Note (Signed)
 LCSW Group Therapy Note   Group Date: 05/05/2024 Start Time: 1100 End Time: 1200   Participation:  did not attend  Type of Therapy:  Group Therapy   Topic:  Lifestyle:  from "One Day" to "Today is Day One"  Objective:  To promote mental and physical well-being through lifestyle changes in routine, nutrition, sleep, and movement.  Goals: Increase awareness of how lifestyle habits impact mental health. Encourage one small, achievable wellness goal. Support group sharing and accountability.  Summary:  Group members explored how daily habits influence mental health and discussed the importance of starting with small, manageable changes. Participants identified personal goals and shared reflections on improving structure, sleep, diet, and physical activity.  Therapeutic Modalities: CBT - Identifying and challenging all-or-nothing thinking; promoting realistic, helpful thoughts about change. Psychoeducation - Teaching about the impact of sleep, nutrition, movement, and routine on mental health. Motivational Interviewing - Eliciting personal motivation and exploring readiness for change. Goal-Setting - Supporting SMART goals to build self-efficacy and encourage follow-through.   Richard Mclean, LCSWA 05/05/2024  7:06 PM

## 2024-05-05 NOTE — Progress Notes (Signed)
   05/05/24 0823  Psych Admission Type (Psych Patients Only)  Admission Status Voluntary  Psychosocial Assessment  Patient Complaints Depression  Eye Contact Fair  Facial Expression Flat  Affect Appropriate to circumstance  Speech Logical/coherent  Interaction Assertive  Motor Activity Slow  Appearance/Hygiene Improved  Behavior Characteristics Cooperative;Appropriate to situation  Mood Depressed  Thought Process  Coherency Disorganized  Content WDL  Delusions None reported or observed  Perception WDL  Hallucination None reported or observed  Judgment Poor  Confusion None  Danger to Self  Current suicidal ideation? Denies  Agreement Not to Harm Self Yes  Danger to Others  Danger to Others None reported or observed

## 2024-05-05 NOTE — Plan of Care (Signed)
  Problem: Activity: Goal: Sleeping patterns will improve Outcome: Progressing   Problem: Coping: Goal: Ability to verbalize frustrations and anger appropriately will improve Outcome: Progressing Goal: Ability to demonstrate self-control will improve Outcome: Progressing   

## 2024-05-05 NOTE — Progress Notes (Signed)
(  Sleep Hours) - 8.25   (Any PRNs that were needed, meds refused, or side effects to meds)- Vistaril  25 mg Anxiety and Trazodone  50 mg Sleep  (Any disturbances and when (visitation, over night)- None  (Concerns raised by the patient)- None  (SI/HI/AVH)- Denies

## 2024-05-05 NOTE — [facility] Group Notes (Signed)
 Adult Psychoeducational Group Note  Date:  05/05/2024 Time:  3;00 pm  Group Topic/Focus:  Healthy Communication  Participation Level:  Did Not Attend  Participation Quality:  na  Affect:  Flat  Cognitive:  Alert  Insight: None  Engagement in Group:  None  Modes of Intervention:  na  Additional Comments:  na  Richard Mclean Doing 05/05/2024, 5:40 PM

## 2024-05-06 NOTE — Group Note (Unsigned)
 Date:  05/06/2024 Time:  2:57 AM  Group Topic/Focus:  Wrap-Up Group:   The focus of this group is to help patients review their daily goal of treatment and discuss progress on daily workbooks.     Participation Level:  {BHH PARTICIPATION OZCZO:77735}  Participation Quality:  {BHH PARTICIPATION QUALITY:22265}  Affect:  {BHH AFFECT:22266}  Cognitive:  {BHH COGNITIVE:22267}  Insight: {BHH Insight2:20797}  Engagement in Group:  {BHH ENGAGEMENT IN HMNLE:77731}  Modes of Intervention:  {BHH MODES OF INTERVENTION:22269}  Additional Comments:  ***  Lang Donia Law 05/06/2024, 2:57 AM

## 2024-05-06 NOTE — Plan of Care (Signed)
   Problem: Education: Goal: Knowledge of [facility] Education information/materials will improve Outcome: Progressing Goal: Emotional status will improve Outcome: Progressing Goal: Mental status will improve Outcome: Progressing Goal: Verbalization of understanding the information provided will improve Outcome: Progressing

## 2024-05-06 NOTE — Group Note (Deleted)
 Date:  05/06/2024 Time:  2:22 PM  Group Topic/Focus:  Wellness Toolbox:   The focus of this group is to discuss various aspects of wellness, balancing those aspects and exploring ways to increase the ability to experience wellness.  Patients will create a wellness toolbox for use upon discharge.     Participation Level:  {BHH PARTICIPATION OZCZO:77735}  Participation Quality:  {BHH PARTICIPATION QUALITY:22265}  Affect:  {BHH AFFECT:22266}  Cognitive:  {BHH COGNITIVE:22267}  Insight: {BHH Insight2:20797}  Engagement in Group:  {BHH ENGAGEMENT IN HMNLE:77731}  Modes of Intervention:  {BHH MODES OF INTERVENTION:22269}  Additional Comments:  ***  Myra Curtistine BROCKS 05/06/2024, 2:22 PM

## 2024-05-06 NOTE — [facility] Group Notes (Signed)
 Adult Psychoeducational Group Note  Date:  05/06/2024 Time:  4:10 AM  Group Topic/Focus:  Wrap-Up Group:   The focus of this group is to help patients review their daily goal of treatment and discuss progress on daily workbooks.  Participation Level:  Active  Participation Quality:  Appropriate  Affect:  Appropriate  Cognitive:  Appropriate  Insight: Appropriate  Engagement in Group:  Engaged  Modes of Intervention:  Discussion  Additional Comments:  Gay did not have a positive thing happen to him.  Lang Drilling Long 05/06/2024, 4:10 AM

## 2024-05-06 NOTE — Progress Notes (Signed)
   05/06/24 0840  Psych Admission Type (Psych Patients Only)  Admission Status Voluntary  Psychosocial Assessment  Patient Complaints Depression  Eye Contact Fair  Facial Expression Flat  Affect Appropriate to circumstance  Speech Logical/coherent  Interaction Assertive  Motor Activity Slow  Appearance/Hygiene Improved  Behavior Characteristics Cooperative;Appropriate to situation  Mood Pleasant  Thought Process  Coherency WDL  Content WDL  Delusions None reported or observed  Perception WDL  Hallucination None reported or observed  Judgment Poor  Confusion None  Danger to Self  Current suicidal ideation? Denies  Agreement Not to Harm Self Yes  Danger to Others  Danger to Others None reported or observed

## 2024-05-06 NOTE — Plan of Care (Signed)
   Problem: Activity: Goal: Interest or engagement in activities will improve Outcome: Progressing   Problem: Coping: Goal: Ability to verbalize frustrations and anger appropriately will improve Outcome: Progressing   Problem: Safety: Goal: Periods of time without injury will increase Outcome: Progressing

## 2024-05-06 NOTE — Group Note (Signed)
 Date:  05/06/2024 Time:  6:25 PM  Group Topic/Focus:  Overcoming Stress:   The focus of this group is to define stress and help patients assess their triggers.    Participation Level:  Active  Participation Quality:  Appropriate and Attentive  Affect:  Appropriate  Cognitive:  Appropriate  Insight: Good  Engagement in Group:  Engaged  Modes of Intervention:  Activity and Discussion    Richard Mclean 05/06/2024, 6:25 PM

## 2024-05-06 NOTE — [facility] Group Notes (Signed)
 Adult Psychoeducational Group Note  Date:  05/06/2024 Time:  4:12 AM  Group Topic/Focus:  Wrap-Up Group:   The focus of this group is to help patients review their daily goal of treatment and discuss progress on daily workbooks.  Participation Level:  Active  Participation Quality:  Appropriate  Affect:  Appropriate  Cognitive:  Appropriate  Insight: Appropriate  Engagement in Group:  Engaged  Modes of Intervention:  Discussion  Additional Comments:  Delma said he had no positive experience today  Lang Drilling Long 05/06/2024, 4:12 AM

## 2024-05-06 NOTE — Group Note (Signed)
 Date:  05/06/2024 Time:  11:13 AM  Group Topic/Focus:  Goals Group:   The focus of this group is to help patients establish daily goals to achieve during treatment and discuss how the patient can incorporate goal setting into their daily lives to aide in recovery.    Participation Level:  Active  Participation Quality:  Appropriate  Affect:  Appropriate  Cognitive:  Alert and Appropriate  Insight: Appropriate  Engagement in Group:  Engaged  Modes of Intervention:  Discussion  Additional Comments:  Pt goal is to just maintain. Pt coping skill is to take walks, talk to self and yoga.  Richard Mclean Dawn 05/06/2024, 11:13 AM

## 2024-05-07 DIAGNOSIS — F3130 Bipolar disorder, current episode depressed, mild or moderate severity, unspecified: Secondary | ICD-10-CM

## 2024-05-07 MED ORDER — OLANZAPINE 10 MG PO TABS: 10.0000 mg | ORAL_TABLET | Freq: Every day | ORAL | 0 refills | Status: DC

## 2024-05-07 MED ORDER — FLUOXETINE HCL 20 MG PO CAPS: 20.0000 mg | ORAL_CAPSULE | Freq: Every day | ORAL | 0 refills | Status: DC

## 2024-05-07 NOTE — Group Note (Signed)
 LCSW Group Therapy Note   Group Date: 05/07/2024 Start Time: 1100 End Time: 1200  Participation:  did not attend  Type of Therapy:  Group Therapy   Topic:  Stress Less:  Nurturing Your Mind and Body Through Calm   Objective:  Learn techniques for managing stress through body relaxation, mindfulness, and self-compassion.  Goals: Use body relaxation techniques, such as Box Breathing and Progressive Muscle Relaxation, to reduce physical tension. Practice mindfulness to break the cycle of overthinking and mental chatter. Embrace self-compassion to handle stress with kindness and resilience.  Summary:  Today's session focused on calming the body with relaxation techniques, breaking the cycle of stress with mindfulness, and using self-compassion to manage challenges more gracefully. These tools help reduce stress and foster a balanced, peaceful mindset.  Therapeutic Modalities used:  Elements of CBT ( cognitive restructuring)  Elements of DBT (box breathing, progressive body relaxation, mindfulness, acceptance)    Bodin Gorka O Dejay Kronk, LCSWA 05/07/2024  12:26 PM

## 2024-05-07 NOTE — Progress Notes (Signed)
   05/07/24 0800  Psych Admission Type (Psych Patients Only)  Admission Status Voluntary  Psychosocial Assessment  Patient Complaints None  Eye Contact Fair  Facial Expression Flat  Affect Appropriate to circumstance  Speech Logical/coherent  Interaction Assertive  Motor Activity Slow  Appearance/Hygiene Improved  Behavior Characteristics Cooperative;Appropriate to situation  Mood Pleasant  Thought Process  Coherency WDL  Content WDL  Delusions None reported or observed  Perception WDL  Hallucination None reported or observed  Judgment Poor  Confusion None  Danger to Self  Current suicidal ideation? Denies  Danger to Others  Danger to Others None reported or observed

## 2024-05-07 NOTE — Transportation (Signed)
 05/07/2024  Richard Mclean. DOB: 08/21/1976 MRN: [id number]   RIDER WAIVER AND RELEASE OF LIABILITY  For the purposes of helping with transportation needs, [facility] partners with outside transportation providers (taxi companies, [place], Catering manager.) to give [facility] patients or other approved people the choice of on-demand rides Public librarian) to our buildings for non-emergency visits.  By using [employer], I, the person signing this document, on behalf of myself and/or any legal minors (in my care using the [employer]), agree:  Science writer given to me are supplied by independent, outside transportation providers who do not work for, or have any affiliation with, [employer]. [place] is not a transportation company. [facility] has no control over the quality or safety of the rides I get using [employer]. [facility] has no control over whether any outside ride will happen on time or not. [facility] gives no guarantee on the reliability, quality, safety, or availability on any rides, or that no mistakes will happen. I know and accept that traveling by vehicle (car, truck, SVU, fleeta, bus, taxi, etc.) has risks of serious injuries such as disability, being paralyzed, and death. I know and agree the risk of using [employer] is mine alone, and not [employer]. [employer] are provided as is and as are available. The transportation providers are in charge for all inspections and care of the vehicles used to provide these rides. I agree not to take legal action against [facility], its agents, employees, officers, directors, representatives, insurers, attorneys, assigns, successors, subsidiaries, and affiliates at any time for any reasons related directly or indirectly to using [employer]. I also agree not to take legal action against [facility] or its affiliates for any injury, death, or damage to property caused by or related to  using [employer]. I have read this Waiver and Release of Liability, and I understand the terms used in it and their legal meaning. This Waiver is freely and voluntarily given with the understanding that my right (or any legal minors) to legal action against [facility] relating to [employer] is knowingly given up to use these services.   I attest that I read the Ride Waiver and Release of Liability to Richard W Buccieri Jr., gave Richard Mclean the opportunity to ask questions and answered the questions asked (if any). I affirm that Richard W Benda Jr. then provided consent for assistance with transportation.

## 2024-05-07 NOTE — Plan of Care (Signed)
   Problem: Education: Goal: Knowledge of [facility] Education information/materials will improve Outcome: Progressing Goal: Emotional status will improve Outcome: Progressing Goal: Mental status will improve Outcome: Progressing Goal: Verbalization of understanding the information provided will improve Outcome: Progressing

## 2024-05-07 NOTE — Progress Notes (Signed)
 [facility] MD Progress Note  05/07/2024 9:14 AM Richard Mclean.  MRN:  [id number] Subjective:   48 year old Caucasian male, divorced, homeless, unemployed.  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  Presented by EMS and [facility] on account of odd and bizarre behavior.  Reported to be responding to internal stimuli at presentation.  Reported to be checking on pulse repeatedly at presentation.  Expressed belief that his cigarette was contaminated with radiation. Routine labs were essentially normal.  UDS was positive for THC.  Chart reviewed today.  Patient discussed at multidisciplinary team meeting.  Nursing staff reports that patient has been appropriate on the unit. Patient has been interacting well with peers. No behavioral issues. Patient has not voiced any suicidal thoughts. Patient has not been observed to be internally stimulated. Patient has been adherent with treatment recommendations. Patient has been tolerating their medication well.  Social worker reports that patient's mother will take him into her home.  Seen today.  In good spirits.  Patient is pleased that his mother will take him in.  States that his mother has always been supportive.  Not endorsing any worries or any concerns today.  Not endorsing any feelings of depression.  Patient is not endorsing any abnormal perception.  Is not endorsing any delusion.  No rageful thoughts towards himself or towards anyone.  He is tolerating his medications well.  He looks forward to discharge soon.  Principal Problem: MDD (major depressive disorder), recurrent, severe, with psychosis (HCC) Diagnosis: Active Problems:   Bipolar I disorder, current or most recent episode depressed, with psychotic features (HCC)  Total Time spent with patient: 20 minutes  Past Psychiatric History:  Previous Psych Diagnoses: MDD, Bipolar (per patient), Alcohol Use Disorder, Cannibus Use,  and Cocaine Use  Prior inpatient treatment: denies Current/prior outpatient treatment:  denies Psychotherapy [id number] History of suicide: denies History of homicide: denies Psychiatric medication history:denies Psychiatric medication compliance history:denies     Substance Abuse Hx: Alcohol: reports 6 beers a day Tobacco: denies Illicit drugs: reports cocaine use last week what I can get my hands on Rx drug abuse: denies Rehab hx: denies  Past Medical History:  Past Medical History:  Diagnosis Date   Depression    History reviewed. No pertinent surgical history. Family History:  Family History  Problem Relation Age of Onset   Alcohol abuse Mother    Depression Mother    Mental illness Mother    Mental illness Father    Mental illness Sister    HIV/AIDS Brother    Mental illness Maternal Grandmother    Family Psychiatric  History:  Reports extensive family history of mental health disorder with mother, father and sister both suffering but is unable to further elaborate. Denies suicide in family   Social History:  Social History   Substance and Sexual Activity  Alcohol Use Yes   Comment: Malt liquor or liquor every other weekend     Social History   Substance and Sexual Activity  Drug Use Not Currently   Types: Marijuana    Social History   Socioeconomic History   Marital status: Single    Spouse name: Not on file   Number of children: 0   Years of education: GED   Highest education level: GED or equivalent  Occupational History   Occupation: Unemployed  Tobacco Use   Smoking status: Every Day    Types: Cigarettes   Smokeless tobacco: Never  Vaping Use   Vaping  status: Every Day  Substance and Sexual Activity   Alcohol use: Yes    Comment: Malt liquor or liquor every other weekend   Drug use: Not Currently    Types: Marijuana   Sexual activity: Not on file  Other Topics Concern   Not on file  Social History Narrative   ** Merged History  Encounter **       Completed social determinants screening and administered PHQ 9 along with Biopsychosocial assessment.    Social Drivers of Health   Financial Resource Strain: High Risk (05/04/2019)   Overall Financial Resource Strain (CARDIA)    Difficulty of Paying Living Expenses: Hard  Food Insecurity: Food Insecurity Present (05/02/2024)   Hunger Vital Sign    Worried About Running Out of Food in the Last Year: Sometimes true    Ran Out of Food in the Last Year: Sometimes true  Transportation Needs: No Transportation Needs (05/02/2024)   PRAPARE - Administrator, Civil Service (Medical): No    Lack of Transportation (Non-Medical): No  Physical Activity: Sufficiently Active (05/04/2019)   Exercise Vital Sign    Days of Exercise per Week: 7 days    Minutes of Exercise per Session: 30 min  Stress: Stress Concern Present (05/04/2019)   [employer] of Occupational Health - Occupational Stress Questionnaire    Feeling of Stress : Very much  Social Connections: Socially Isolated (05/04/2019)   Social Connection and Isolation Panel    Frequency of Communication with Friends and Family: Once a week    Frequency of Social Gatherings with Friends and Family: Never    Attends Religious Services: Never    Database administrator or Organizations: No    Attends Engineer, structural: Never    Marital Status: Never married     Current Medications: Current Facility-Administered Medications  Medication Dose Route Frequency Provider Last Rate Last Admin   acetaminophen  (TYLENOL ) tablet 650 mg  650 mg Oral Q6H PRN Arloa Suzen RAMAN, NP       alum & mag hydroxide-simeth (MAALOX/MYLANTA) 200-200-20 MG/5ML suspension 30 mL  30 mL Oral Q4H PRN Arloa Suzen RAMAN, NP       haloperidol  (HALDOL ) tablet 5 mg  5 mg Oral TID PRN Arloa Suzen RAMAN, NP       And   diphenhydrAMINE  (BENADRYL ) capsule 50 mg  50 mg Oral TID PRN Arloa Suzen RAMAN, NP       haloperidol  lactate  (HALDOL ) injection 5 mg  5 mg Intramuscular TID PRN Arloa Suzen RAMAN, NP       And   diphenhydrAMINE  (BENADRYL ) injection 50 mg  50 mg Intramuscular TID PRN Arloa Suzen RAMAN, NP       And   LORazepam  (ATIVAN ) injection 2 mg  2 mg Intramuscular TID PRN Arloa Suzen RAMAN, NP       haloperidol  lactate (HALDOL ) injection 10 mg  10 mg Intramuscular TID PRN Arloa Suzen RAMAN, NP       And   diphenhydrAMINE  (BENADRYL ) injection 50 mg  50 mg Intramuscular TID PRN Arloa Suzen RAMAN, NP       And   LORazepam  (ATIVAN ) injection 2 mg  2 mg Intramuscular TID PRN Arloa Suzen RAMAN, NP       FLUoxetine  (PROZAC ) capsule 20 mg  20 mg Oral Daily Zouev, Dmitri, MD   20 mg at 05/07/24 0746   magnesium  hydroxide (MILK OF MAGNESIA) suspension 30 mL  30 mL Oral Daily PRN Arloa Suzen  S, NP       OLANZapine  (ZYPREXA ) tablet 10 mg  10 mg Oral QHS Edy Mcbane, Richard LABOR, MD   10 mg at 05/06/24 2117   thiamine  (Vitamin B-1) tablet 100 mg  100 mg Oral Daily Arloa Suzen RAMAN, NP   100 mg at 05/07/24 0746   traZODone  (DESYREL ) tablet 50 mg  50 mg Oral QHS PRN Arloa Suzen RAMAN, NP   50 mg at 05/05/24 2109    Lab Results:  No results found for this or any previous visit (from the past 48 hours).   Blood Alcohol level:  Lab Results  Component Value Date   ETH <15 05/02/2024   ETH 53 (H) 02/16/2024    Metabolic Disorder Labs: Lab Results  Component Value Date   HGBA1C 5.4 05/04/2024   MPG 108 05/04/2024   No results found for: PROLACTIN Lab Results  Component Value Date   CHOL 182 05/04/2024   TRIG 84 05/04/2024   HDL 52 05/04/2024   CHOLHDL 3.5 05/04/2024   VLDL 17 05/04/2024   LDLCALC 113 (H) 05/04/2024   LDLCALC 135 (H) 05/02/2024    Physical Findings: AIMS:  ,  ,  ,  ,  ,  ,   CIWA:  CIWA-Ar Total: 0 COWS:     Musculoskeletal: Strength & Muscle Tone: within normal limits Gait & Station: normal Patient leans: N/A  Psychiatric Specialty Exam:  Presentation  General  Appearance and behavior:  Casually dressed, not in any distress, good relatedness.  No EPS.  Eye Contact: Good.  Speech: Spontaneous.  Normal rate, tone and volume.   Mood and Affect  Mood: Euthymic.  Affect: Full range and mood congruent.  Thought Process  Thought Processes: Linear and goal directed.  Descriptions of Associations:Intact  Orientation:Full (Time, Place and Person)  Thought Content: Future oriented.  No current suicidal thoughts.  No homicidal thoughts.  No thoughts of violence.  No negative ruminative flooding.  No guilty ruminations.  No delusional theme.  No obsessions.  Hallucinations: No hallucination in any modality.  Sensorium  Memory: Good.  Judgment: Good.  Insight: Limited as he is not interested in dealing with his addiction.  He is willing to take psychotropic medication.  Executive Functions  Concentration: Good.  Attention Span: Good.  Recall: Good.  Fund of Knowledge: Good.  Language: Good   Psychomotor Activity  Normal psychomotor activity    Physical Exam: Physical Exam ROS Blood pressure 131/82, pulse 63, temperature 98.3 F (36.8 C), temperature source Oral, resp. rate 12, height 5' 9 (1.753 m), weight 83.9 kg, SpO2 100%. Body mass index is 27.32 kg/m.   Treatment Plan Summary: Patient is back to his baseline.  He is not exhibiting any features of depression.  He is not exhibiting any features of psychosis.  No imminent dangerousness.  We are finalizing aftercare.  Hopeful discharge in a day or 2.  1.  Fluoxetine  20 mg daily. 2.  Olanzapine  10 mg at bedtime. 3.  Continue to monitor mood behavior and interaction with others. 4.  Continue to encourage unit groups and therapeutic activities. 5.  Continue to motivate patient towards addiction treatment. 6.  Social worker will coordinate discharge and aftercare planning.   Richard LABOR Forehand, MD 05/07/2024, 9:14 AM

## 2024-05-07 NOTE — Progress Notes (Signed)
 Pt discharged to lobby. Safe transport waiting to take patient home. Pt was stable and appreciative at that time. All papers and prescriptions were given and valuables returned. Suicide safety plan completed. Verbal understanding expressed. Denies SI/HI and A/VH. Pt given opportunity to express concerns and ask questions.

## 2024-05-07 NOTE — BHH Suicide Risk Assessment (Signed)
 [facility] Discharge Suicide Risk Assessment   Principal Problem: MDD (major depressive disorder), recurrent, severe, with psychosis (HCC) Discharge Diagnoses: Active Problems:   Bipolar I disorder, current or most recent episode depressed, with psychotic features (HCC)   Total Time spent with patient: 30 minutes  Musculoskeletal: Strength & Muscle Tone: within normal limits Gait & Station: normal Patient leans: N/A  Psychiatric Specialty Exam  Presentation  General Appearance and behavior:  Casually dressed, not in any distress, appropriate behavior, good relatedness.  No EPS.  Eye Contact: Good.  Speech: Spontaneous.  Normal rate, tone and volume.  Normal prosody of speech.  Mood and Affect  Mood: Euthymic.  Affect: Full range and appropriate.  Thought Process  Thought Processes: Linear and goal directed.  Descriptions of Associations:Intact  Orientation:Full (Time, Place and Person)  Thought Content: Future oriented.  No current suicidal thoughts.  No homicidal thoughts.  No thoughts of violence.  No negative ruminative flooding.  No guilty ruminations.  No delusional theme.  No obsessions.  Hallucinations: No hallucination in any modality.  Sensorium  Memory: Good.  Judgment: Good.  Insight: Good  Executive Functions  Concentration: Good.  Attention Span: Good.  Recall: Good.  Fund of Knowledge: Good.  Language: Good   Psychomotor Activity  Normal psychomotor activity   Physical Exam: Physical Exam ROS Blood pressure 131/82, pulse 63, temperature 98.3 F (36.8 C), temperature source Oral, resp. rate 12, height 5' 9 (1.753 m), weight 83.9 kg, SpO2 100%. Body mass index is 27.32 kg/m.  Mental Status Per Nursing Assessment::   On Admission:  NA  Demographic Factors:  Male  Loss Factors: Financial problems/change in socioeconomic status  Historical Factors: Family history of mental illness or substance abuse and  Impulsivity  Risk Reduction Factors:   Living with another person, especially a relative, Positive social support, Positive therapeutic relationship, and Positive coping skills or problem solving skills  Continued Clinical Symptoms:  No residual symptoms of depression.  No residual symptoms of psychosis.  No overwhelming anxiety.  Cognitive Features That Contribute To Risk:  None    Suicide Risk:  Minimal: No current suicidal thoughts.  No current homicidal thoughts.  No current thoughts of violence.  Modifiable risk factor targeted during this admission as mood disorder and substance use disorder.  Patient has completely detoxed from psychoactive substances.  He is aware of risk of impulsivity while under the influence.  Unfortunately he is not keen on addiction treatment.  Follow-up Information     Services, [facility]. Go on 05/11/2024.   Why: You have a hospital follow up appointment for an assessment, to obtain therapy and medication management services on 05/11/24 at 10:00 am. The appointment will be held in person. Contact information: [address] [place] [address] [phone]                 Plan Of Care/Follow-up recommendations:  See discharge summary  Richard DELENA Forehand, MD 05/07/2024, 9:18 AM

## 2024-05-07 NOTE — Progress Notes (Signed)
(  Sleep Hours) -1.0 (Any PRNs that were needed, meds refused, or side effects to meds)- none (Any disturbances and when (visitation, over night)-none (Concerns raised by the patient)- none (SI/HI/AVH)- Denies all

## 2024-05-07 NOTE — BHH Group Notes (Signed)
 Adult Psychoeducational Group Note  Date:  05/07/2024 Time:  11:08 AM  Group Topic/Focus:  Emotional Education:   The focus of this group is to discuss what feelings/emotions are, and how they are experienced.  Orientation:   The focus of this group is to educate the patient on the purpose and policies of crisis stabilization and provide a format to answer questions about their admission.  The group details unit policies and expectations of patients while admitted.  Participation Level:  Active  Participation Quality:  Appropriate  Affect:  Appropriate  Cognitive:  Alert  Insight: Appropriate  Engagement in Group:  Engaged  Modes of Intervention:  Orientation  Additional Comments:  Pt attended and participated in orientation/goals group. Pt participated in icebreaker activity. Pt goal for today is to come up with a routine and plan things for discharge plan.   Niel CHRISTELLA Nightingale 05/07/2024, 11:08 AM

## 2024-05-07 NOTE — Discharge Summary (Signed)
 Physician Discharge Summary Note  Patient:  Richard Mclean. is an 48 y.o., male MRN:  [id number] DOB:  08-Jun-1976 Patient phone:  [phone] (home)  Patient address:   [address] [place] [address],  Total Time spent with patient: 45 minutes  Date of Admission:  05/02/2024 Date of Discharge: 05/07/2024   Reason for Admission:   48 year old male with a history of polysubstance abuse (alcohol, marijuana, and cocaine) who was brought voluntarily to [facility] by [employer] for evaluation of psychosis.    PER Pysch NP evaluation at [facility] on 8/8: On assessment, patient is noted to be actively responding to internal stimuli- talking and arguing with people not present in the room. He repeatedly states that he is in mental distress. He endorses paranoid delusions, including beliefs that he has been poisoned by lead and that radiation and other impurities have been placed in his cigarettes. He reports that he has been walking barefoot and naked in an effort to remove the impurities from his body. His behavior is bizarre, he is whispering to avoid detection, frequently pulse checks, stating "I'm making sure I'm alive," saluting this NP multiple times, and making disorganized statements related to purifying his body. He reports daily alcohol use, he says he drinks about 6 cans of beers per day and uses unknown quantity of marijuana and cocaine 1-2 times per month. He denies suicidal ideation, homicidal ideation, and visual hallucinations. UDS is positive for cannabis   Patient was seen for followup yesterday by NP Harris as follows:  naked walking remains making statements such as per admission encounter documentation, He repeatedly states that he is in mental distress and frequently pulse checks, stating "I'm making sure I'm alive. Patient today reports, Life's good, I feel good-stable. Endorses achieving sleep and ate two bowls of oh-oh cheerios.  Patient is visibly restless and pacing  around the unit. Patient was asked multiple times about his mental health history. Patient finally acknowledged hx of MDD and reports bipolar disorder, although BP is not documented on chart review. Pt denied SI/HI on admission and continues to deny today. Objectively, patient at present doesn't appear to be responding to internal or external stimuli.  Discussed with patient the need for inpatient hospitalization to treat current mental health symptoms of mania, depression with psychotic features given current symptoms of disorganized thoughts.    Patient seen at bedside. Patient confirms above presentation stating that someone was trying to kill him. Patient reports he has been feeling depressed with symptoms below for the past 2 weeks. States he has been homeless and hallucinating however is oriented x 3 at time of evaluation. States hallucinations and paranoia are Better with restarting Zyprexa . Reports history of manic episodes lasting up to a week as below but denies any recent manic symptoms. Reports 2 weeks of depression with psychotic features. Patient states he feels safer today and that the [facility] are lower. He denies any command AH and would like to leave Zyprex at current dose but states he feels hopeless and depressed and would like to try Prozac . Interested in sober living environment.   Associated Signs/Symptoms: Depression Symptoms:  depressed mood, anhedonia, psychomotor retardation, fatigue, feelings of worthlessness/guilt, difficulty concentrating, hopelessness, Duration of Depression Symptoms: Less than two weeks   (Hypo) Manic Symptoms:  patient denies Anxiety Symptoms:  Excessive Worry, Psychotic Symptoms:  Delusions, Hallucinations: Auditory Ideas of Reference, Paranoia, PTSD Symptoms: Negative  Principal Problem: MDD (major depressive disorder), recurrent, severe, with psychosis (HCC) Discharge Diagnoses: Active Problems:  Bipolar I disorder, current or most recent  episode depressed, with psychotic features [facility])   Past Psychiatric History:  Previous Psych Diagnoses: MDD, Bipolar (per patient), Alcohol Use Disorder, Cannibus Use, and Cocaine Use  Prior inpatient treatment: denies Current/prior outpatient treatment:  denies Psychotherapy [id number] History of suicide: denies History of homicide: denies Psychiatric medication history:denies Psychiatric medication compliance history:denies     Substance Abuse Hx: Alcohol: reports 6 beers a day Tobacco: denies Illicit drugs: reports cocaine use last week what I can get my hands on Rx drug abuse: denies Rehab hx: denies   Past Medical History:  Past Medical History:  Diagnosis Date   Depression    History reviewed. No pertinent surgical history. Family History:  Family History  Problem Relation Age of Onset   Alcohol abuse Mother    Depression Mother    Mental illness Mother    Mental illness Father    Mental illness Sister    HIV/AIDS Brother    Mental illness Maternal Grandmother    Family Psychiatric  History:  Social History:  Social History   Substance and Sexual Activity  Alcohol Use Yes   Comment: Malt liquor or liquor every other weekend     Social History   Substance and Sexual Activity  Drug Use Not Currently   Types: Marijuana    Social History   Socioeconomic History   Marital status: Single    Spouse name: Not on file   Number of children: 0   Years of education: GED   Highest education level: GED or equivalent  Occupational History   Occupation: Unemployed  Tobacco Use   Smoking status: Every Day    Types: Cigarettes   Smokeless tobacco: Never  Vaping Use   Vaping status: Every Day  Substance and Sexual Activity   Alcohol use: Yes    Comment: Malt liquor or liquor every other weekend   Drug use: Not Currently    Types: Marijuana   Sexual activity: Not on file  Other Topics Concern   Not on file  Social History Narrative   ** Merged History  Encounter **       Completed social determinants screening and administered PHQ 9 along with Biopsychosocial assessment.    Social Drivers of Health   Financial Resource Strain: High Risk (05/04/2019)   Overall Financial Resource Strain (CARDIA)    Difficulty of Paying Living Expenses: Hard  Food Insecurity: Food Insecurity Present (05/02/2024)   Hunger Vital Sign    Worried About Running Out of Food in the Last Year: Sometimes true    Ran Out of Food in the Last Year: Sometimes true  Transportation Needs: No Transportation Needs (05/02/2024)   PRAPARE - Administrator, Civil Service (Medical): No    Lack of Transportation (Non-Medical): No  Physical Activity: Sufficiently Active (05/04/2019)   Exercise Vital Sign    Days of Exercise per Week: 7 days    Minutes of Exercise per Session: 30 min  Stress: Stress Concern Present (05/04/2019)   [employer] of Occupational Health - Occupational Stress Questionnaire    Feeling of Stress : Very much  Social Connections: Socially Isolated (05/04/2019)   Social Connection and Isolation Panel    Frequency of Communication with Friends and Family: Once a week    Frequency of Social Gatherings with Friends and Family: Never    Attends Religious Services: Never    Database administrator or Organizations: No    Attends Banker  Meetings: Never    Marital Status: Never married    Hospital Course:   Patient was admitted on suicide precautions.  He was started on fluoxetine  and olanzapine  which he responded well to.  Patient settled into the unit well.  He participated with unit groups and therapeutic activities.  Patient did not exhibit any challenging behavior during his hospital stay.  He did not require any medical or psychiatric emergency measures.  He was adherent with his medicines.  Sleep-wake cycle was well-regulated.  Patient attended to his basic needs.  His mother was supportive and plans to take him into her  home.  Nursing staff reports that patient has been appropriate on the unit.  No challenging behavior.  No PRNs required.  Not endorsing any worries or any concerns.  Seen today.  Looks forward to discharge later today.  States that he spoke with his mother yesterday.  Patient states that his mother drinks a bit.  States that typically she does not wake up early in the morning.  Patient is not endorsing any concerns.  No suicidal thoughts.  No homicidal thoughts.  No thoughts of violence.  Patient is not endorsing any persecutory ideas.  No other form of delusion.  No abnormal perception.  No adverse effects from his medication.  Patient and team agrees that he is back to his baseline.  Team agrees with discharge today.   Physical Findings: AIMS:  , ,  ,  ,  ,  ,   CIWA:  CIWA-Ar Total: 0 COWS:     Musculoskeletal: Strength & Muscle Tone: within normal limits Gait & Station: normal Patient leans: N/A   Psychiatric Specialty Exam:  Presentation  General Appearance and behavior:  Casually dressed, not in any distress, appropriate behavior, engaged politely.  No EPS.  Eye Contact: Good.  Speech: Spontaneous.  Normal rate, tone and volume.  Normal prosody of speech.  Mood and Affect  Mood: Euthymic.  Affect: Full range and appropriate.  Thought Process  Thought Processes: Linear and goal directed.  Descriptions of Associations:Intact  Orientation:Full (Time, Place and Person)  Thought Content: Future oriented.  No current suicidal thoughts.  No homicidal thoughts.  No thoughts of violence.  No negative ruminative flooding.  No guilty ruminations.  No delusional theme.  No obsessions.  Hallucinations: No hallucination in any modality.  Sensorium  Memory: Good.  Judgment: Good.  Insight: Good  Executive Functions  Concentration: Good.  Attention Span: Good.  Recall: Good.  Fund of Knowledge: Good.  Language: Good   Psychomotor Activity  Normal  psychomotor activity    Physical Exam: Physical Exam ROS Blood pressure 131/82, pulse 63, temperature 98.3 F (36.8 C), temperature source Oral, resp. rate 12, height 5' 9 (1.753 m), weight 83.9 kg, SpO2 100%. Body mass index is 27.32 kg/m.   Social History   Tobacco Use  Smoking Status Every Day   Types: Cigarettes  Smokeless Tobacco Never   Tobacco Cessation:  N/A, patient does not currently use tobacco products   Blood Alcohol level:  Lab Results  Component Value Date   ETH <15 05/02/2024   ETH 53 (H) 02/16/2024    Metabolic Disorder Labs:  Lab Results  Component Value Date   HGBA1C 5.4 05/04/2024   MPG 108 05/04/2024   No results found for: PROLACTIN Lab Results  Component Value Date   CHOL 182 05/04/2024   TRIG 84 05/04/2024   HDL 52 05/04/2024   CHOLHDL 3.5 05/04/2024   VLDL 17 05/04/2024  LDLCALC 113 (H) 05/04/2024   LDLCALC 135 (H) 05/02/2024    See Psychiatric Specialty Exam and Suicide Risk Assessment completed by Attending Physician prior to discharge.  Discharge destination:  Home  Is patient on multiple antipsychotic therapies at discharge:  No   Has Patient had three or more failed trials of antipsychotic monotherapy by history:  No  Recommended Plan for Multiple Antipsychotic Therapies: NA   Allergies as of 05/07/2024   No Known Allergies      Medication List     TAKE these medications      Indication  FLUoxetine  20 MG capsule Commonly known as: PROZAC  Take 1 capsule (20 mg total) by mouth daily. Start taking on: May 08, 2024  Indication: Depression   OLANZapine  10 MG tablet Commonly known as: ZYPREXA  Take 1 tablet (10 mg total) by mouth at bedtime.  Indication: Delusions of Parasitosis, Major Depressive Disorder        Follow-up Information     [facility]. Go on 05/11/2024.   Why: You have a hospital follow up appointment for an assessment, to obtain therapy and medication management services on  05/11/24 at 10:00 am. The appointment will be held in person. Contact information: [address] [place] [address] [phone]                 Follow-up recommendations:   Patient will stay on medication as recommended.  Encouraged to abstain from alcohol on any psychoactive substance.  No restrictions with respect to activity or diet.   Signed: Jerrell DELENA Forehand, MD 05/07/2024, 11:36 AM

## 2024-05-07 NOTE — Progress Notes (Signed)
  [facility] Adult Case Management Discharge Plan :  Will you be returning to the same living situation after discharge:  No.  At discharge, do you have transportation home?: Yes,  CSW arranged Safe Transport to mothers home, address on file around 1:00pm  Do you have the ability to pay for your medications: Yes,  pt has active health insurance coverage  Release of information consent forms completed and in the chart;  Patient's signature needed at discharge.  Patient to Follow up at:  Follow-up Information     [facility]. Go on 05/11/2024.   Why: You have a hospital follow up appointment for an assessment, to obtain therapy and medication management services on 05/11/24 at 10:00 am. The appointment will be held in person. Contact information: [address] [place] [address] [phone]                 Next level of care provider has access to [facility] Link:no  Safety Planning and Suicide Prevention discussed: Yes,  Lander Eslick, Mother, [phone]     Has patient been referred to the Quitline?: Yes, faxed/e-referral on 8/14  Patient has been referred for addiction treatment: No known substance use disorder.  Jenkins LULLA Primer, LCSWA 05/07/2024, 9:13 AM

## 2024-05-08 NOTE — [facility] Group Notes (Signed)
 Spirituality group facilitated by Elia Rockie Sofia, BCC.  Group Description: Group focused on topic of hope. Patients participated in facilitated discussion around topic, connecting with one another around experiences and definitions for hope. Group members engaged with visual explorer photos, reflecting on what hope looks like for them today. Group engaged in discussion around how their definitions of hope are present today in hospital.  Modalities: Psycho-social ed, Adlerian, Narrative, MI  Patient Progress: Leontae attended group and actively engaged and participated in group conversation and activities.

## 2024-05-11 ENCOUNTER — Encounter (HOSPITAL_COMMUNITY): Payer: Self-pay

## 2024-05-11 ENCOUNTER — Emergency Department (HOSPITAL_COMMUNITY)
Admission: EM | Admit: 2024-05-11 | Discharge: 2024-05-11 | Disposition: A | Discharge status: Other Acute Inpatient Hospital | Attending: Emergency Medicine | Admitting: Emergency Medicine

## 2024-05-11 ENCOUNTER — Encounter (HOSPITAL_COMMUNITY): Payer: Self-pay | Admitting: Psychiatry

## 2024-05-11 ENCOUNTER — Other Ambulatory Visit: Payer: Self-pay

## 2024-05-11 ENCOUNTER — Inpatient Hospital Stay (HOSPITAL_COMMUNITY)
Admission: AD | Admit: 2024-05-11 | Discharge: 2024-05-18 | DRG: 885 | Disposition: A | Discharge status: Home / Self Care | Payer: MEDICAID | Source: Intra-hospital

## 2024-05-11 DIAGNOSIS — F1010 Alcohol abuse, uncomplicated: Secondary | ICD-10-CM | POA: Diagnosis present

## 2024-05-11 DIAGNOSIS — R45851 Suicidal ideations: Secondary | ICD-10-CM | POA: Diagnosis present

## 2024-05-11 DIAGNOSIS — F17210 Nicotine dependence, cigarettes, uncomplicated: Secondary | ICD-10-CM | POA: Diagnosis present

## 2024-05-11 DIAGNOSIS — F29 Unspecified psychosis not due to a substance or known physiological condition: Secondary | ICD-10-CM | POA: Diagnosis present

## 2024-05-11 DIAGNOSIS — Z5902 Unsheltered homelessness: Secondary | ICD-10-CM | POA: Diagnosis not present

## 2024-05-11 DIAGNOSIS — F17290 Nicotine dependence, other tobacco product, uncomplicated: Secondary | ICD-10-CM | POA: Diagnosis present

## 2024-05-11 DIAGNOSIS — F329 Major depressive disorder, single episode, unspecified: Principal | ICD-10-CM | POA: Diagnosis present

## 2024-05-11 DIAGNOSIS — F319 Bipolar disorder, unspecified: Secondary | ICD-10-CM | POA: Insufficient documentation

## 2024-05-11 DIAGNOSIS — Z555 Less than a high school diploma: Secondary | ICD-10-CM

## 2024-05-11 DIAGNOSIS — Z560 Unemployment, unspecified: Secondary | ICD-10-CM | POA: Diagnosis not present

## 2024-05-11 DIAGNOSIS — Z79899 Other long term (current) drug therapy: Secondary | ICD-10-CM | POA: Diagnosis not present

## 2024-05-11 DIAGNOSIS — Z6828 Body mass index (BMI) 28.0-28.9, adult: Secondary | ICD-10-CM | POA: Diagnosis not present

## 2024-05-11 DIAGNOSIS — F509 Eating disorder, unspecified: Secondary | ICD-10-CM | POA: Diagnosis present

## 2024-05-11 DIAGNOSIS — Z604 Social exclusion and rejection: Secondary | ICD-10-CM | POA: Diagnosis present

## 2024-05-11 DIAGNOSIS — Z818 Family history of other mental and behavioral disorders: Secondary | ICD-10-CM | POA: Diagnosis not present

## 2024-05-11 DIAGNOSIS — F209 Schizophrenia, unspecified: Secondary | ICD-10-CM | POA: Diagnosis present

## 2024-05-11 DIAGNOSIS — Z5941 Food insecurity: Secondary | ICD-10-CM | POA: Diagnosis not present

## 2024-05-11 DIAGNOSIS — Z5986 Financial insecurity: Secondary | ICD-10-CM | POA: Diagnosis not present

## 2024-05-11 DIAGNOSIS — F315 Bipolar disorder, current episode depressed, severe, with psychotic features: Secondary | ICD-10-CM | POA: Diagnosis present

## 2024-05-11 DIAGNOSIS — Z811 Family history of alcohol abuse and dependence: Secondary | ICD-10-CM

## 2024-05-11 LAB — CBC
HCT: 41.9 % (ref 39.0–52.0)
Hemoglobin: 14.1 g/dL (ref 13.0–17.0)
MCH: 32.5 pg (ref 26.0–34.0)
MCHC: 33.7 g/dL (ref 30.0–36.0)
MCV: 96.5 fL (ref 80.0–100.0)
Platelets: 268 K/uL (ref 150–400)
RBC: 4.34 MIL/uL (ref 4.22–5.81)
RDW: 11.8 % (ref 11.5–15.5)
WBC: 9 K/uL (ref 4.0–10.5)
nRBC: 0 % (ref 0.0–0.2)

## 2024-05-11 LAB — COMPREHENSIVE METABOLIC PANEL WITH GFR
ALT: 75 U/L — ABNORMAL HIGH (ref 0–44)
AST: 45 U/L — ABNORMAL HIGH (ref 15–41)
Albumin: 4 g/dL (ref 3.5–5.0)
Alkaline Phosphatase: 59 U/L (ref 38–126)
Anion gap: 9 (ref 5–15)
BUN: 11 mg/dL (ref 6–20)
CO2: 22 mmol/L (ref 22–32)
Calcium: 8.8 mg/dL — ABNORMAL LOW (ref 8.9–10.3)
Chloride: 105 mmol/L (ref 98–111)
Creatinine, Ser: 1.01 mg/dL (ref 0.61–1.24)
GFR, Estimated: >60 mL/min (ref >60)
Glucose, Bld: 99 mg/dL (ref 70–99)
Potassium: 3.5 mmol/L (ref 3.5–5.1)
Sodium: 136 mmol/L (ref 135–145)
Total Bilirubin: 0.2 mg/dL (ref 0.0–1.2)
Total Protein: 6.9 g/dL (ref 6.5–8.1)

## 2024-05-11 LAB — RAPID URINE DRUG SCREEN, HOSP PERFORMED
Amphetamines: NOT DETECTED
Barbiturates: NOT DETECTED
Benzodiazepines: NOT DETECTED
Cocaine: NOT DETECTED
Opiates: NOT DETECTED
Tetrahydrocannabinol: NOT DETECTED

## 2024-05-11 LAB — ETHANOL: Alcohol, Ethyl (B): <15 mg/dL (ref <15)

## 2024-05-11 MED ORDER — OLANZAPINE 10 MG PO TABS
10.0000 mg | ORAL_TABLET | Freq: Every day | ORAL | Status: DC
  Administered 2024-05-11: 10 mg via ORAL
  Filled 2024-05-11: qty 1

## 2024-05-11 MED ORDER — DIPHENHYDRAMINE HCL 25 MG PO CAPS: 50.0000 mg | ORAL_CAPSULE | Freq: Three times a day (TID) | ORAL | Status: DC | PRN

## 2024-05-11 MED ORDER — HYDROXYZINE HCL 25 MG PO TABS
25.0000 mg | ORAL_TABLET | Freq: Three times a day (TID) | ORAL | Status: DC | PRN
  Administered 2024-05-16 – 2024-05-17 (×3): 25 mg via ORAL
  Filled 2024-05-11 (×3): qty 1

## 2024-05-11 MED ORDER — FLUOXETINE HCL 20 MG PO CAPS
20.0000 mg | ORAL_CAPSULE | Freq: Every day | ORAL | Status: DC
  Administered 2024-05-11 – 2024-05-18 (×8): 20 mg via ORAL
  Filled 2024-05-11 (×6): qty 1
  Filled 2024-05-11: qty 7
  Filled 2024-05-11 (×2): qty 1

## 2024-05-11 MED ORDER — LORAZEPAM 2 MG/ML IJ SOLN: 2.0000 mg | Freq: Three times a day (TID) | INTRAMUSCULAR | Status: DC | PRN

## 2024-05-11 MED ORDER — DIPHENHYDRAMINE HCL 50 MG/ML IJ SOLN: 50.0000 mg | Freq: Three times a day (TID) | INTRAMUSCULAR | Status: DC | PRN

## 2024-05-11 MED ORDER — TRAZODONE HCL 50 MG PO TABS
50.0000 mg | ORAL_TABLET | Freq: Every evening | ORAL | Status: DC | PRN
  Administered 2024-05-13 – 2024-05-17 (×3): 50 mg via ORAL
  Filled 2024-05-11 (×3): qty 1

## 2024-05-11 MED ORDER — HALOPERIDOL 5 MG PO TABS: 5.0000 mg | ORAL_TABLET | Freq: Three times a day (TID) | ORAL | Status: DC | PRN

## 2024-05-11 MED ORDER — HALOPERIDOL LACTATE 5 MG/ML IJ SOLN: 5.0000 mg | Freq: Three times a day (TID) | INTRAMUSCULAR | Status: DC | PRN

## 2024-05-11 MED ORDER — HALOPERIDOL LACTATE 5 MG/ML IJ SOLN: 10.0000 mg | Freq: Three times a day (TID) | INTRAMUSCULAR | Status: DC | PRN

## 2024-05-11 MED ORDER — MAGNESIUM HYDROXIDE 400 MG/5ML PO SUSP: 30.0000 mL | Freq: Every day | ORAL | Status: DC | PRN

## 2024-05-11 MED ORDER — ALUM & MAG HYDROXIDE-SIMETH 200-200-20 MG/5ML PO SUSP: 30.0000 mL | ORAL | Status: DC | PRN

## 2024-05-11 MED ORDER — ACETAMINOPHEN 325 MG PO TABS: 650.0000 mg | ORAL_TABLET | Freq: Four times a day (QID) | ORAL | Status: DC | PRN

## 2024-05-11 NOTE — ED Notes (Signed)
 Pt spoke to his mother on the phone.

## 2024-05-11 NOTE — Tx Team (Signed)
 Initial Treatment Plan 05/11/2024 5:44 PM [name] [name]. FMW:[id number]    PATIENT STRESSORS: Financial difficulties   Health problems   Marital or family conflict     PATIENT STRENGTHS: Supportive family/friends    PATIENT IDENTIFIED PROBLEMS: Patient states his momma is the problem                     DISCHARGE CRITERIA:  Adequate post-discharge living arrangements  PRELIMINARY DISCHARGE PLAN: Placement in alternative living arrangements  PATIENT/FAMILY INVOLVEMENT: This treatment plan has been presented to and reviewed with the patient, Richard W Weitzel Jr.,  The patient and family have been given the opportunity to ask questions and make suggestions.  Elouise Wolm Morel, RN 05/11/2024, 5:44 PM

## 2024-05-11 NOTE — ED Notes (Signed)
 Pt attempted to close room door and stated I have secrets you don't know about. Pt door is open now and pt sitting on bed.

## 2024-05-11 NOTE — ED Notes (Signed)
Security wanded pt ?
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
## 2024-05-11 NOTE — Progress Notes (Signed)
 Pt rates depression 0/10 and anxiety 0/10. Pt reports a good appetite, and no physical problems. Pt denies SI/HI/AVH and verbally contracts for safety. Provided support and encouragement. Pt safe on the unit. Q 15 minute safety checks continued.

## 2024-05-11 NOTE — ED Triage Notes (Signed)
 Pt BIB police department for voluntary evaluation of suicidal thoughts. Feels like his life is hopeless, feels like he is a burden on every one and he said he might as well die.  His plan is to lay down in front of traffic or throw himself off a bridge. [place] his mother does not want to be bothered with him. Wants help.
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
## 2024-05-11 NOTE — ED Notes (Signed)
 Pt provides consent to discuss plan of care with his mother. This Rn called and updated re: the plan for inpatient care at [facility]. The pts mother states, I am glad because I had gotten scared of him. Either he is going to kill someone, someone is going to kill him, or he is going to kill himself.

## 2024-05-11 NOTE — Progress Notes (Addendum)
 Pt has been accepted to [facility] on 05/11/2024 Bed assignment: 401-02  Pt meets inpatient criteria per: Cathaleen Jacobson NP  Attending Physician will be: Pashayan MD.    Report can be called to: Adult unit: [phone]  Pt can arrive after [facility] WILL UPDATE   Care Team Notified: [facility] Danika Carlo RN, Cathaleen Mangrum NP  [place] Tayanna Talford LCSW-A   05/11/2024 1:21 PM

## 2024-05-11 NOTE — ED Notes (Signed)
 Pt dressed out in burgundy scrubs.  Pt belongings (shirt, shorts, black tennis shoes, a pack of gum with license and voter registration card inside) placed in locker 7.

## 2024-05-11 NOTE — ED Notes (Signed)
 Safe transport called to transport patient to [facility]. Nurse aware

## 2024-05-11 NOTE — Progress Notes (Signed)
   05/11/24 1800  Psych Admission Type (Psych Patients Only)  Admission Status Voluntary  Psychosocial Assessment  Patient Complaints Anxiety  Eye Contact Brief  Facial Expression Sad  Affect Depressed  Speech Logical/coherent  Interaction Assertive  Motor Activity Slow  Appearance/Hygiene In scrubs;Disheveled  Behavior Characteristics Cooperative  Mood Depressed  Thought Process  Coherency WDL  Content WDL  Delusions None reported or observed  Perception WDL  Hallucination None reported or observed  Judgment WDL  Confusion None  Danger to Self  Current suicidal ideation? Denies  Danger to Others  Danger to Others None reported or observed

## 2024-05-11 NOTE — Progress Notes (Signed)
 Patient denies SI/HI and states he is feeling better since he got here. He reports passive AVH usually when he is sleeping. Patient states he is unable to go home once leaves [facility] and would need emergency shelter arrangements. He is pleasant and cooperative. He states being angry at his family because they were suppose to take him to his appointments today and they didn't and it caused him to relapse with my mental health. Consents and paperwork signed and patient reoriented to the unit.

## 2024-05-12 DIAGNOSIS — F209 Schizophrenia, unspecified: Principal | ICD-10-CM

## 2024-05-12 MED ORDER — OLANZAPINE 5 MG PO TABS
5.0000 mg | ORAL_TABLET | Freq: Every day | ORAL | Status: AC
  Administered 2024-05-12: 5 mg via ORAL
  Filled 2024-05-12: qty 1

## 2024-05-12 MED ORDER — OLANZAPINE 5 MG PO TABS: 5.0000 mg | ORAL_TABLET | Freq: Every day | ORAL | Status: DC

## 2024-05-12 MED ORDER — ARIPIPRAZOLE 5 MG PO TABS
5.0000 mg | ORAL_TABLET | Freq: Every day | ORAL | Status: DC
  Administered 2024-05-13 – 2024-05-14 (×2): 5 mg via ORAL
  Filled 2024-05-12 (×2): qty 1

## 2024-05-12 NOTE — Group Note (Signed)
 Date:  05/12/2024 Time:  9:34 AM  Group Topic/Focus:  Goals Group:   The focus of this group is to help patients establish daily goals to achieve during treatment and discuss how the patient can incorporate goal setting into their daily lives to aide in recovery.    Participation Level:  Active  Participation Quality:  Attentive  Affect:  Appropriate  Cognitive:  Appropriate  Insight: Appropriate  Engagement in Group:  Engaged  Modes of Intervention:  Discussion  Additional Comments: Pt goal is to stay on the right track and to have a positive outlook on things to come.  Richard Mclean Dawn 05/12/2024, 9:34 AM

## 2024-05-12 NOTE — Progress Notes (Signed)
   05/12/24 0940  Psych Admission Type (Psych Patients Only)  Admission Status Voluntary  Psychosocial Assessment  Patient Complaints None  Eye Contact Brief  Facial Expression Anxious  Affect Appropriate to circumstance  Speech Logical/coherent  Interaction Assertive  Motor Activity Slow  Appearance/Hygiene In scrubs  Behavior Characteristics Cooperative  Mood Depressed  Thought Process  Coherency WDL  Content WDL  Delusions None reported or observed  Perception WDL  Hallucination None reported or observed  Judgment WDL  Confusion WDL  Danger to Self  Agreement Not to Harm Self Yes  Description of Agreement verbal  Danger to Others  Danger to Others None reported or observed

## 2024-05-12 NOTE — Group Note (Signed)
 LCSW Group Therapy Note   Group Date: 05/12/2024 Start Time: 1100 End Time: 1200     Participation:  did not attend   Type of Therapy:  Group Therapy   Topic:  Speaking from the Heart: Communicating with Understanding and Empathy   Objective:  To help participants develop effective communication skills to express themselves clearly, listen actively, and navigate conflicts in a healthy way.   Goals: Increase awareness of verbal and non-verbal communication skills. Practice using "I" statements and active listening techniques. Learn coping strategies for managing communication stress.   Summary:  Participants explored the importance of communication, discussed challenges, and practiced skills such as active listening and assertive expression. They reflected on past experiences and identified ways to improve communication in their daily lives.   Therapeutic Modalities: Cognitive-Behavioral Therapy (CBT): Restructuring negative thought patterns in communication. Mindfulness: Staying present and calm during conversations. Psychoeducation: Learning about effective communication techniques.   Kaiesha Tonner O Cloma Rahrig, LCSWA 05/12/2024  12:41 PM
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
## 2024-05-12 NOTE — Progress Notes (Signed)
(  Sleep Hours) - 7.25 (Any PRNs that were needed, meds refused, or side effects to meds)- none (Any disturbances and when (visitation, over night)- none (Concerns raised by the patient)- none (SI/HI/AVH)- Denies

## 2024-05-12 NOTE — Progress Notes (Deleted)
   05/12/24 0940  Psych Admission Type (Psych Patients Only)  Admission Status Voluntary  Psychosocial Assessment  Patient Complaints None  Eye Contact Brief  Facial Expression Anxious  Affect Appropriate to circumstance  Speech Logical/coherent  Interaction Assertive  Motor Activity Slow  Appearance/Hygiene In scrubs  Behavior Characteristics Cooperative  Mood Depressed  Thought Process  Coherency WDL  Content WDL  Delusions None reported or observed  Perception WDL  Hallucination None reported or observed  Judgment WDL  Confusion WDL  Danger to Self  Agreement Not to Harm Self Yes  Description of Agreement verbal  Danger to Others  Danger to Others None reported or observed

## 2024-05-12 NOTE — [facility] Suicide Risk Assessment (Signed)
 [facility] INPATIENT:  Family/Significant Other Suicide Prevention Education  Suicide Prevention Education:  Patient Refusal for Family/Significant Other Suicide Prevention Education: The patient Richard W Ishida Jr. has refused to provide written consent for family/significant other to be provided Family/Significant Other Suicide Prevention Education during admission and/or prior to discharge.  Physician notified.  Will attempt to receive permission again prior to discharge. Otherwise, will complete with patient at time of discharge.  Louetta Lame 05/12/2024, 3:54 PM

## 2024-05-12 NOTE — Progress Notes (Signed)
 Group Topic/Focus:  Number of participants : 11 Medication compliance and side effect.  Discussed the importance of medication compliance in achieving the desired therapeutic effect by adhering to the required regimen.  Patients educated about some common side effects and were encouraged to reports to their provider any side effects.  Adequate food and fluid intake, sleep and support from family members are also helpful. Patient was attentive and receptive.

## 2024-05-12 NOTE — [facility] Group Notes (Signed)
 Adult Psychoeducational Group Note  Date:  05/12/2024 Time:  9:00 PM  Group Topic/Focus:  Wrap-Up Group:   The focus of this group is to help patients review their daily goal of treatment and discuss progress on daily workbooks.  Participation Level:  Active  Participation Quality:  Appropriate  Affect:  Appropriate  Cognitive:  Appropriate  Insight: Appropriate  Engagement in Group:  Engaged  Modes of Intervention:  Discussion  Additional Comments:  Jadarian said the the positive part of his day lunch.   Lang Drilling Long 05/12/2024, 9:00 PM

## 2024-05-12 NOTE — Plan of Care (Signed)
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
## 2024-05-13 ENCOUNTER — Encounter (HOSPITAL_COMMUNITY): Payer: Self-pay

## 2024-05-13 MED ORDER — NICOTINE POLACRILEX 2 MG MT GUM
2.0000 mg | CHEWING_GUM | OROMUCOSAL | Status: DC | PRN
  Administered 2024-05-13 – 2024-05-18 (×13): 2 mg via ORAL
  Filled 2024-05-13 (×5): qty 1

## 2024-05-13 NOTE — Progress Notes (Signed)
  Richard Mclean.   Type of Note: Shelter List  Patient was given list of shelters in the area and was encouraged to call to see about bed availability during hospitalization. Pt reports he would like to discharge to the [facility]. Does not want to return to mother's house.  Will continue to assist as needed.  Signed:  [name], LCSW-A 05/13/2024  1:56 PM

## 2024-05-13 NOTE — Plan of Care (Signed)
   Problem: Education: Goal: Emotional status will improve Outcome: Progressing Goal: Mental status will improve Outcome: Progressing Goal: Verbalization of understanding the information provided will improve Outcome: Progressing   Problem: Activity: Goal: Interest or engagement in activities will improve Outcome: Progressing

## 2024-05-13 NOTE — Group Note (Signed)
 Date:  05/13/2024 Time:  4:11 PM  Group Topic/Focus: Sleep/ Wellness Education Wellness Toolbox:   The focus of this group is to discuss various aspects of wellness, balancing those aspects and exploring ways to increase the ability to experience wellness.  Patients will create a wellness toolbox for use upon discharge.    Participation Level:  Active  Participation Quality:  Appropriate  Affect:  Appropriate  Cognitive:  Alert and Appropriate  Insight: Appropriate  Engagement in Group:  Engaged  Modes of Intervention:  Discussion  Additional Comments:  Patient was engaged appropriately during the group.  Richard Mclean D Nelissa Bolduc 05/13/2024, 4:11 PM

## 2024-05-13 NOTE — BH IP Treatment Plan (Signed)
 Interdisciplinary Treatment and Diagnostic Plan Update  05/13/2024 Time of Session: 10:25 AM Richard Mclean. MRN: [id number]  Principal Diagnosis: Schizophrenia, unspecified (HCC)  Secondary Diagnoses: Principal Problem:   Schizophrenia, unspecified (HCC) Active Problems:   MDD (major depressive disorder)   Current Medications:  Current Facility-Administered Medications  Medication Dose Route Frequency Provider Last Rate Last Admin   acetaminophen  (TYLENOL ) tablet 650 mg  650 mg Oral Q6H PRN Motley-Mangrum, Jadeka A, PMHNP       alum & mag hydroxide-simeth (MAALOX/MYLANTA) 200-200-20 MG/5ML suspension 30 mL  30 mL Oral Q4H PRN Motley-Mangrum, Jadeka A, PMHNP       ARIPiprazole  (ABILIFY ) tablet 5 mg  5 mg Oral Daily Ji, Andrew, MD   5 mg at 05/13/24 9257   haloperidol  (HALDOL ) tablet 5 mg  5 mg Oral TID PRN Motley-Mangrum, Jadeka A, PMHNP       And   diphenhydrAMINE  (BENADRYL ) capsule 50 mg  50 mg Oral TID PRN Motley-Mangrum, Jadeka A, PMHNP       haloperidol  lactate (HALDOL ) injection 5 mg  5 mg Intramuscular TID PRN Motley-Mangrum, Jadeka A, PMHNP       And   diphenhydrAMINE  (BENADRYL ) injection 50 mg  50 mg Intramuscular TID PRN Motley-Mangrum, Jadeka A, PMHNP       And   LORazepam  (ATIVAN ) injection 2 mg  2 mg Intramuscular TID PRN Motley-Mangrum, Jadeka A, PMHNP       haloperidol  lactate (HALDOL ) injection 10 mg  10 mg Intramuscular TID PRN Motley-Mangrum, Jadeka A, PMHNP       And   diphenhydrAMINE  (BENADRYL ) injection 50 mg  50 mg Intramuscular TID PRN Motley-Mangrum, Jadeka A, PMHNP       And   LORazepam  (ATIVAN ) injection 2 mg  2 mg Intramuscular TID PRN Motley-Mangrum, Jadeka A, PMHNP       FLUoxetine  (PROZAC ) capsule 20 mg  20 mg Oral Daily Motley-Mangrum, Jadeka A, PMHNP   20 mg at 05/13/24 9257   hydrOXYzine  (ATARAX ) tablet 25 mg  25 mg Oral TID PRN Motley-Mangrum, Jadeka A, PMHNP       magnesium  hydroxide (MILK OF MAGNESIA) suspension 30 mL  30 mL Oral Daily PRN  Motley-Mangrum, Jadeka A, PMHNP       nicotine  polacrilex (NICORETTE ) gum 2 mg  2 mg Oral PRN Towana Leita SAILOR, MD       traZODone  (DESYREL ) tablet 50 mg  50 mg Oral QHS PRN Motley-Mangrum, Jadeka A, PMHNP       PTA Medications: Medications Prior to Admission  Medication Sig Dispense Refill Last Dose/Taking   FLUoxetine  (PROZAC ) 20 MG capsule Take 1 capsule (20 mg total) by mouth daily. 30 capsule 0 Past Week   OLANZapine  (ZYPREXA ) 10 MG tablet Take 1 tablet (10 mg total) by mouth at bedtime. 30 tablet 0 Past Week    Patient Stressors: Financial difficulties   Health problems   Marital or family conflict    Patient Strengths: Supportive family/friends   Treatment Modalities: Medication Management, Group therapy, Case management,  1 to 1 session with clinician, Psychoeducation, Recreational therapy.   Physician Treatment Plan for Primary Diagnosis: Schizophrenia, unspecified (HCC) Long Term Goal(s):     Short Term Goals:    Medication Management: Evaluate patient's response, side effects, and tolerance of medication regimen.  Therapeutic Interventions: 1 to 1 sessions, Unit Group sessions and Medication administration.  Evaluation of Outcomes: Not Progressing  Physician Treatment Plan for Secondary Diagnosis: Principal Problem:   Schizophrenia, unspecified (HCC) Active Problems:  MDD (major depressive disorder)  Long Term Goal(s):     Short Term Goals:       Medication Management: Evaluate patient's response, side effects, and tolerance of medication regimen.  Therapeutic Interventions: 1 to 1 sessions, Unit Group sessions and Medication administration.  Evaluation of Outcomes: Not Progressing   RN Treatment Plan for Primary Diagnosis: Schizophrenia, unspecified (HCC) Long Term Goal(s): Knowledge of disease and therapeutic regimen to maintain health will improve  Short Term Goals: Ability to remain free from injury will improve, Ability to verbalize frustration and  anger appropriately will improve, Ability to demonstrate self-control, Ability to participate in decision making will improve, Ability to verbalize feelings will improve, Ability to disclose and discuss suicidal ideas, Ability to identify and develop effective coping behaviors will improve, and Compliance with prescribed medications will improve  Medication Management: RN will administer medications as ordered by provider, will assess and evaluate patient's response and provide education to patient for prescribed medication. RN will report any adverse and/or side effects to prescribing provider.  Therapeutic Interventions: 1 on 1 counseling sessions, Psychoeducation, Medication administration, Evaluate responses to treatment, Monitor vital signs and CBGs as ordered, Perform/monitor CIWA, COWS, AIMS and Fall Risk screenings as ordered, Perform wound care treatments as ordered.  Evaluation of Outcomes: Not Progressing   LCSW Treatment Plan for Primary Diagnosis: Schizophrenia, unspecified (HCC) Long Term Goal(s): Safe transition to appropriate next level of care at discharge, Engage patient in therapeutic group addressing interpersonal concerns.  Short Term Goals: Engage patient in aftercare planning with referrals and resources, Increase social support, Increase ability to appropriately verbalize feelings, Increase emotional regulation, Facilitate acceptance of mental health diagnosis and concerns, Facilitate patient progression through stages of change regarding substance use diagnoses and concerns, Identify triggers associated with mental health/substance abuse issues, and Increase skills for wellness and recovery  Therapeutic Interventions: Assess for all discharge needs, 1 to 1 time with Social worker, Explore available resources and support systems, Assess for adequacy in community support network, Educate family and significant other(s) on suicide prevention, Complete Psychosocial Assessment,  Interpersonal group therapy.  Evaluation of Outcomes: Not Progressing   Progress in Treatment: Attending groups: attended some groups Participating in groups: Yes. Taking medication as prescribed: Yes. Toleration medication: Yes. Family/Significant other contact made: patient declined consents Patient understands diagnosis: Yes. Discussing patient identified problems/goals with staff: Yes. Medical problems stabilized or resolved: Yes. Denies suicidal/homicidal ideation: Yes. Issues/concerns per patient self-inventory: No.  New problem(s) identified:  No  New Short Term/Long Term Goal(s):    medication stabilization, elimination of SI thoughts, development of comprehensive mental wellness plan.    Patient Goals:  I'm currently taking new medications.    Discharge Plan or Barriers:  Patient recently admitted. CSW will continue to follow and assess for appropriate referrals and possible discharge planning.    Reason for Continuation of Hospitalization: Depression Hallucinations Medication stabilization Suicidal ideation  Estimated Length of Stay:  5 - 7 days  Last 3 [place] Suicide Severity Risk Score: Flowsheet Row [facility] Most recent reading at 05/11/2024  5:00 PM ED from 05/11/2024 in [facility] Most recent reading at 05/11/2024  4:04 AM Admission (Discharged) [facility] Most recent reading at 05/02/2024 12:00 PM  C-SSRS RISK CATEGORY High Risk High Risk No Risk    Last PHQ 2/9 Scores:    10/31/2017    2:56 PM  Depression  screen PHQ 2/9  Decreased Interest 3  Down, Depressed, Hopeless 3  PHQ - 2 Score 6  Altered sleeping 2  Tired, decreased energy 3  Change in appetite 0  Feeling bad or failure about yourself  3  Trouble concentrating 3  Moving slowly or fidgety/restless 3  Suicidal thoughts 0   PHQ-9 Score 20    Scribe for Treatment Team: Isaish Alemu O Eknoor Novack, LCSWA 05/13/2024 6:31 PM

## 2024-05-13 NOTE — Progress Notes (Signed)
   05/13/24 1043  Psych Admission Type (Psych Patients Only)  Admission Status Voluntary  Psychosocial Assessment  Patient Complaints None  Eye Contact Brief  Facial Expression Anxious  Affect Appropriate to circumstance  Speech Logical/coherent  Interaction Assertive  Motor Activity Slow  Appearance/Hygiene In scrubs  Behavior Characteristics Cooperative  Mood Pleasant  Thought Process  Coherency WDL  Content WDL  Delusions None reported or observed  Perception WDL  Hallucination None reported or observed  Judgment WDL  Confusion WDL  Danger to Self  Current suicidal ideation? Denies  Agreement Not to Harm Self Yes  Description of Agreement verbal  Danger to Others  Danger to Others None reported or observed

## 2024-05-13 NOTE — Group Note (Signed)
 Date:  05/13/2024 Time:  9:24 AM  Group Topic/Focus:  Goals Group:   The focus of this group is to help patients establish daily goals to achieve during treatment and discuss how the patient can incorporate goal setting into their daily lives to aide in recovery. Orientation:   The focus of this group is to educate the patient on the purpose and policies of crisis stabilization and provide a format to answer questions about their admission.  The group details unit policies and expectations of patients while admitted.    Participation Level:  Did Not Attend   Cruz JONETTA Mars 05/13/2024, 9:24 AM

## 2024-05-13 NOTE — Progress Notes (Signed)
 [facility] MD Progress Note  Date: 05/13/2024 9:16 AM Name: Richard Mclean.  DOB: [birth year] MRN: [id number] Unit: 0401/0401-02  CC: Suicidal ideation and bizarre behavior  Richard Mclean is a 48 y.o. male  with a documented PMH of  MDD with psychosis, EtOH abuse, and recent discharge from [facility] on 05/07/2024, who was transferred from [facility]  to [facility] (05/11/2024) for voluntary admission for suicidal ideation and bizarre behavior.  DAY: 2  Principal Problem: Schizophrenia, unspecified (HCC) Diagnosis: Principal Problem:   Schizophrenia, unspecified (HCC) Active Problems:   MDD (major depressive disorder)  Overnight Events: No PRNs. Slept 7.75 hours. No events per chart review.   Today on interview, pt reports no complaints or concerns overnight. When asked about VH, patient talks about men and women walking around trying to be ninja's similar to the games he used to play as a kid. Unsure if he is referring to other patients on the unit walking in the hallways. Patient gives tangential answers when asked to elaborate. Patient denies AH. Patient then notes he is a king and that his servants curse him. Patient starts making remarks on how he is very smart, and that smart people are often sad. After repeat questioning patient notes that he feels a little sad. Patient denies SI and HI. Patient reports that he has to move around on the streets because someone is going to kill him for his inheritance of 30 acres. Patient has no physical complaints and asks about his new medication this morning and why he was switched from his previous antipsychotic. Answered patient's questions and patient voiced understanding. Patient frequently went from sitting, to standing, to pacing during the interview. He frequently fidgeted with either a piece of paper or clothes that were laid out. Patient also frequently had moments of inappropriate laughter during the interview.   Attempted Collateral call with sister  Javyon Fontan (phone [phone] from patient's mom and [phone] noted in chart 8 years ago): Both incorrect phone numbers.   ROS No physical complaints at this time.  HISTORY  Past Medical History:  Past Medical History:  Diagnosis Date   Depression     History reviewed. No pertinent surgical history. Family History:  Family History  Problem Relation Age of Onset   Alcohol abuse Mother    Depression Mother    Mental illness Mother    Mental illness Father    Mental illness Sister    HIV/AIDS Brother    Mental illness Maternal Grandmother    Social History:  Social History   Substance and Sexual Activity  Alcohol Use Yes   Comment: Malt liquor or liquor every other weekend     Social History   Substance and Sexual Activity  Drug Use Not Currently   Types: Marijuana    Social History   Socioeconomic History   Marital status: Single    Spouse name: Not on file   Number of children: 0   Years of education: GED   Highest education level: GED or equivalent  Occupational History   Occupation: Unemployed  Tobacco Use   Smoking status: Every Day    Types: Cigarettes   Smokeless tobacco: Never  Vaping Use   Vaping status: Every Day  Substance and Sexual Activity   Alcohol use: Yes    Comment: Malt liquor or liquor every other weekend   Drug use: Not Currently    Types: Marijuana   Sexual activity: Yes  Other Topics Concern   Not on file  Social History Narrative   ** Merged History Encounter **       Completed social determinants screening and administered PHQ 9 along with Biopsychosocial assessment.    Social Drivers of Health   Financial Resource Strain: High Risk (05/04/2019)   Overall Financial Resource Strain (CARDIA)    Difficulty of Paying Living Expenses: Hard  Food Insecurity: Food Insecurity Present (05/11/2024)   Hunger Vital Sign    Worried About Running Out of Food in the Last Year: Sometimes true    Ran Out of Food in the Last Year:  Sometimes true  Transportation Needs: No Transportation Needs (05/11/2024)   PRAPARE - Administrator, Civil Service (Medical): No    Lack of Transportation (Non-Medical): No  Physical Activity: Sufficiently Active (05/04/2019)   Exercise Vital Sign    Days of Exercise per Week: 7 days    Minutes of Exercise per Session: 30 min  Stress: Stress Concern Present (05/04/2019)   [employer] of Occupational Health - Occupational Stress Questionnaire    Feeling of Stress : Very much  Social Connections: Socially Isolated (05/04/2019)   Social Connection and Isolation Panel    Frequency of Communication with Friends and Family: Once a week    Frequency of Social Gatherings with Friends and Family: Never    Attends Religious Services: Never    Database administrator or Organizations: No    Attends Banker Meetings: Never    Marital Status: Never married   Additional Social History:  Patient is unhoused in [place]. Per mom's report patient has not been able to consistently hold a job for several years. Per patient and mom's report patient struggled in school growing up and left school at some point in highschool. Mom thinks patient may have completed his GED while incarcerated.    Current Medications: Current Facility-Administered Medications  Medication Dose Route Frequency Provider Last Rate Last Admin   acetaminophen  (TYLENOL ) tablet 650 mg  650 mg Oral Q6H PRN Motley-Mangrum, Jadeka A, PMHNP       alum & mag hydroxide-simeth (MAALOX/MYLANTA) 200-200-20 MG/5ML suspension 30 mL  30 mL Oral Q4H PRN Motley-Mangrum, Jadeka A, PMHNP       ARIPiprazole  (ABILIFY ) tablet 5 mg  5 mg Oral Daily Ji, Andrew, MD   5 mg at 05/13/24 9257   haloperidol  (HALDOL ) tablet 5 mg  5 mg Oral TID PRN Motley-Mangrum, Jadeka A, PMHNP       And   diphenhydrAMINE  (BENADRYL ) capsule 50 mg  50 mg Oral TID PRN Motley-Mangrum, Jadeka A, PMHNP       haloperidol  lactate (HALDOL ) injection 5 mg   5 mg Intramuscular TID PRN Motley-Mangrum, Jadeka A, PMHNP       And   diphenhydrAMINE  (BENADRYL ) injection 50 mg  50 mg Intramuscular TID PRN Motley-Mangrum, Jadeka A, PMHNP       And   LORazepam  (ATIVAN ) injection 2 mg  2 mg Intramuscular TID PRN Motley-Mangrum, Jadeka A, PMHNP       haloperidol  lactate (HALDOL ) injection 10 mg  10 mg Intramuscular TID PRN Motley-Mangrum, Jadeka A, PMHNP       And   diphenhydrAMINE  (BENADRYL ) injection 50 mg  50 mg Intramuscular TID PRN Motley-Mangrum, Jadeka A, PMHNP       And   LORazepam  (ATIVAN ) injection 2 mg  2 mg Intramuscular TID PRN Motley-Mangrum, Jadeka A, PMHNP       FLUoxetine  (PROZAC ) capsule 20 mg  20 mg Oral Daily Motley-Mangrum, Jadeka A,  PMHNP   20 mg at 05/13/24 9257   hydrOXYzine  (ATARAX ) tablet 25 mg  25 mg Oral TID PRN Motley-Mangrum, Jadeka A, PMHNP       magnesium  hydroxide (MILK OF MAGNESIA) suspension 30 mL  30 mL Oral Daily PRN Motley-Mangrum, Jadeka A, PMHNP       nicotine  polacrilex (NICORETTE ) gum 2 mg  2 mg Oral PRN Towana Leita SAILOR, MD       traZODone  (DESYREL ) tablet 50 mg  50 mg Oral QHS PRN Motley-Mangrum, Jadeka A, PMHNP        OBJECTIVE  BP 133/89 (BP Location: Right Arm)   Pulse 70   Temp 98.4 F (36.9 C) (Oral)   Resp 16   Ht 5' 9 (1.753 m)   Wt 87.8 kg   SpO2 100%   BMI 28.59 kg/m   Physical Exam Physical Exam Vitals and nursing note reviewed.  Constitutional:      General: He is not in acute distress. HENT:     Head: Normocephalic and atraumatic.  Pulmonary:     Effort: Pulmonary effort is normal.  Musculoskeletal:        General: Normal range of motion.     Cervical back: Normal range of motion.  Neurological:     Mental Status: He is alert.      Psychiatric Specialty Exam: Presentation Presentation  General Appearance:Appropriate for Environment, Other (comment) (In unit scrubs) Eye Contact:Fair Speech:Clear and Coherent, Normal Rate Volume:Normal  Mood and Affect  Mood: (A little  sad) Affect:Non-Congruent (Cheerful)  Thought Process  Thought Process:Disorganized Descriptions of Associations:Tangential (Boardering on loose at times)  Thought Content Suicidal Thoughts:No Homicidal Thoughts:No Hallucinations:Visual, Other (comment) (No Auditory Hallucination) Ideas of Reference:Delusions, Paranoia Thought Content:Delusions, Paranoid Ideation, Tangential  Sensorium  Memory:Immediate Fair, Recent Fair Judgment:Poor Insight:Shallow  Executive Functions  Orientation:Full (Time, Place and Person) Language:Fair Concentration:Poor Attention:Poor Recall:Fair Fund of Knowledge:Poor  Psychomotor Activity  Psychomotor Activity:Psychomotor Activity: Normal  Assets  Assets:Physical Health  Sleep  Quality:Good Documented sleep last 24 hours: 7.75   Lab Results:  No results found for this or any previous visit (from the past 48 hours).  Blood Alcohol level:  Lab Results  Component Value Date   [facility] <15 05/11/2024   ETH <15 05/02/2024    Metabolic Disorder Labs: Lab Results  Component Value Date   HGBA1C 5.4 05/04/2024   MPG 108 05/04/2024   No results found for: PROLACTIN Lab Results  Component Value Date   CHOL 182 05/04/2024   TRIG 84 05/04/2024   HDL 52 05/04/2024   CHOLHDL 3.5 05/04/2024   VLDL 17 05/04/2024   LDLCALC 113 (H) 05/04/2024   LDLCALC 135 (H) 05/02/2024   ASSESSMENT / PLAN  Diagnoses / Active Problems: Principal Problem:   Schizophrenia, unspecified (HCC) Active Problems:   MDD (major depressive disorder)  Assessment: Richard Mclean. Is a 48 year old man with history of MDD with psychotic features and recent psychiatric hospitalization earlier this month who is admitted for suicidal ideation and bizarre behavior. Unspecified psychotic disorder is the leading differential at this time given patient answers are disorganized, reports of visual hallucinations and paranoid ideation. Though uncertain if suspected visual  hallucinations may be odd interpretation/communication of events due to possible IDD or schizotypal personality disorder. Considering possible personality disorder traits, specifically schizotypal given report of bizarre behavior since childhood; and IDD given patient reports of repeated difficulty in school, not completing highschool, and report of possible IEP in childhood. Also considering ADHD in differential  given patient's history of struggling in school, challenging childhood behavior at home per mom's report, and poor concentration and attention span currently. Will continue Abilify  today to target psychotic symptoms with plan to titrate in the following days. Patient with history of alcohol use disorder. Given recent discharge from [facility] 4 days prior to admission and negative ethanol at admission, unlikely that alcohol withdrawal is contributing to current psychiatric picture.    PLAN: Safety and Monitoring:             --  Voluntary admission to inpatient psychiatric unit for safety, stabilization and treatment             -- Daily contact with patient to assess and evaluate symptoms and progress in treatment             -- Patient's case to be discussed in multi-disciplinary team meeting             -- Observation Level : q15 minute checks             -- Vital signs:  q12 hours             -- Precautions: suicide, elopement, and assault Agitation PRNs acetaminophen , 650 mg, Q6H PRN alum & mag hydroxide-simeth, 30 mL, Q4H PRN haloperidol , 5 mg, TID PRN  And diphenhydrAMINE , 50 mg, TID PRN haloperidol  lactate, 5 mg, TID PRN  And diphenhydrAMINE , 50 mg, TID PRN  And LORazepam , 2 mg, TID PRN haloperidol  lactate, 10 mg, TID PRN  And diphenhydrAMINE , 50 mg, TID PRN  And LORazepam , 2 mg, TID PRN hydrOXYzine , 25 mg, TID PRN magnesium  hydroxide, 30 mL, Daily PRN traZODone , 50 mg, QHS PRN       2. 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 Medical Issues Being Addressed:  None - Would benefit from establishing with a PCP  4. Discharge Planning:  Tentative Date: TBA  Barrier: Psychosis Location: Homeless Shelter   Treatment Plan Summary: I certify that inpatient services furnished can reasonably be expected to improve the patient's condition.   The risks/benefits/side-effects/alternatives to this medication were discussed in detail with the patient and time was given for questions. The patient consents to medication trial. The patient consents to medication trial. FDA black box warnings, if present, were discussed. Metabolic profile and EKG monitoring obtained while on an atypical antipsychotic  Encouraged patient to participate in unit milieu and in scheduled group therapies  Short Term Goals: Ability to identify changes in lifestyle to reduce recurrence of condition will improve, Ability to verbalize feelings will improve, Ability to disclose and discuss suicidal ideas, Ability to demonstrate self-control will improve, Ability to identify and develop effective coping behaviors will improve, Ability to maintain clinical measurements within normal limits will improve, Compliance with prescribed medications will improve, and Ability to identify triggers associated with substance abuse/mental health issues will improve Long Term Goals: Improvement in symptoms so as ready for discharge Social work and case management to assist with discharge planning and identification of hospital follow-up needs prior to discharge Estimated LOS: 5-7 days Discharge Concerns: Need to establish a safety plan; Medication compliance and effectiveness Discharge Goals: Return home with outpatient referrals for mental health follow-up including  medication management/psychotherapy   Patient's case was discussed with Attending, see  attestation for more information.   Signed: Logan Dross, MS4 [employer]

## 2024-05-13 NOTE — Progress Notes (Signed)
(  Sleep Hours) - 7.75 (Any PRNs that were needed, meds refused, or side effects to meds)- no prns, no meds refused, no side effects  (Any disturbances and when (visitation, over night)- n/a  (Concerns raised by the patient)- n/a  (SI/HI/AVH)- denies

## 2024-05-14 MED ORDER — ARIPIPRAZOLE 10 MG PO TABS
10.0000 mg | ORAL_TABLET | Freq: Every day | ORAL | Status: DC
  Administered 2024-05-15 – 2024-05-18 (×4): 10 mg via ORAL
  Filled 2024-05-14: qty 1
  Filled 2024-05-14: qty 7
  Filled 2024-05-14 (×3): qty 1

## 2024-05-14 NOTE — Progress Notes (Signed)
(  Sleep Hours) - 8.75 (Any PRNs that were needed, meds refused, or side effects to meds)- PRN trazodone  50 mg given at pt request, no meds refused. (Any disturbances and when (visitation, over night)- None (Concerns raised by the patient)- None  (SI/HI/AVH)- Denies SI/HI/AVH

## 2024-05-14 NOTE — Plan of Care (Signed)
  Problem: Education: Goal: Mental status will improve Outcome: Not Progressing Goal: Verbalization of understanding the information provided will improve Outcome: Not Progressing   

## 2024-05-14 NOTE — Group Note (Signed)
 LCSW Group Therapy Note   Group Date: 05/14/2024 Start Time: 1100 End Time: 1200  Participation: Attended. Patient was actively listening and engaged in discussion. Facilitator was able to easily re-direct when necessary. Patient occasionally responded to internal stimuli.   Type of Therapy:  Group Therapy  Topic: Stronger Together: Building Healthy Relationships  Objective: To explore loneliness, boundaries, and safe ways to build relationships.   Goals:  1. Recognize healthy vs. unhealthy relationships.  2. Learn safe ways to connect with others.  3. Strengthen communication and boundary-setting skills.   Summary: Participants discussed loneliness, healthy connections, and setting boundaries. They explored safe ways to meet people and shared personal experiences. Key insights were reinforced through discussion and quotes.   Therapeutic Modalities Used:   Cognitive Behavioral Therapy (CBT) Elements - Identifying unhealthy relationship patterns, challenging negative thoughts about connection.   Dialectical Behavior Therapy (DBT) Elements - Interpersonal effectiveness, setting and maintaining boundaries.   Supportive Group Therapy - Peer discussion, shared experiences, and emotional validation.  Louetta Lame, LCSWA 05/14/2024  1:23 PM

## 2024-05-14 NOTE — Plan of Care (Signed)
   Problem: Education: Goal: Emotional status will improve Outcome: Progressing Goal: Mental status will improve Outcome: Progressing Goal: Verbalization of understanding the information provided will improve Outcome: Progressing   Problem: Activity: Goal: Interest or engagement in activities will improve Outcome: Progressing

## 2024-05-14 NOTE — Progress Notes (Signed)
   05/13/24 1950  Psych Admission Type (Psych Patients Only)  Admission Status Voluntary  Psychosocial Assessment  Patient Complaints None  Eye Contact Brief  Facial Expression Animated  Affect Appropriate to circumstance  Speech Logical/coherent  Interaction Assertive  Motor Activity Slow  Appearance/Hygiene In scrubs  Behavior Characteristics Cooperative;Calm  Mood Pleasant  Thought Process  Coherency WDL  Content WDL  Delusions None reported or observed  Perception WDL  Hallucination None reported or observed  Judgment WDL  Confusion None  Danger to Self  Current suicidal ideation? Denies  Agreement Not to Harm Self Yes  Description of Agreement Verbal  Danger to Others  Danger to Others None reported or observed

## 2024-05-14 NOTE — [facility] Group Notes (Addendum)
 Spirituality Group   Description: Participant directed exploration of values, beliefs and meaning (Theme of shame/guilt/resilience through self-compassion)  Following a brief framework of chaplain's role and ground rules of group behavior, participants are invited to share concerns or questions that engage spiritual life. Emphasis placed on common themes and shared experiences and ways to make meaning and clarify living into one's values.   Theory/Process/Goal: Utilize the theoretical framework of group therapy established by Celena Kite, Relational Cultural Theory and Rogerian approaches to facilitate relational empathy and use of the "here and now" to foster reflection, self-awareness, and sharing.   Observations: Zell was an active participate in the group discussion. He often veered away from topic at hand (or perhaps that was his best effort at engagement, meaning making) but was pleasant and appropriate with regard to peers, facilitator.  Taela Charbonneau L. Fredrica, M.Div [phone]

## 2024-05-14 NOTE — Progress Notes (Signed)
   05/14/24 1400  Psych Admission Type (Psych Patients Only)  Admission Status Voluntary  Psychosocial Assessment  Patient Complaints None  Eye Contact Brief  Facial Expression Animated  Affect Appropriate to circumstance  Speech Logical/coherent  Interaction Assertive  Motor Activity Slow  Appearance/Hygiene In scrubs  Behavior Characteristics Cooperative;Calm  Mood Pleasant  Thought Process  Coherency WDL  Content WDL  Delusions None reported or observed  Perception WDL  Hallucination None reported or observed  Judgment WDL  Confusion None  Danger to Self  Current suicidal ideation? Denies  Description of Suicide Plan No Plan  Agreement Not to Harm Self Yes  Description of Agreement Verbal Contract  Danger to Others  Danger to Others None reported or observed

## 2024-05-14 NOTE — Group Note (Signed)
 Date:  05/15/2024 Time:  4:13 AM  Group Topic/Focus:  Wrap-Up Group:   The focus of this group is to help patients review their daily goal of treatment and discuss progress on daily workbooks.    Participation Level:  Active  Participation Quality:  Appropriate and Sharing  Affect:  Appropriate  Cognitive:  Appropriate  Insight: Appropriate and Limited  Engagement in Group:  Engaged  Modes of Intervention:  Activity and Socialization  Additional Comments:  Patient shared that his day was alright. Patient shared the highlight of his day being, going outside. Patient shared that he has been interacting with others on the unit and being proactive. Patient rated his day a 7 out of 10.   Richard Mclean 05/15/2024, 4:13 AM

## 2024-05-14 NOTE — Progress Notes (Signed)
 [facility] MD Progress Note  Date: 05/14/2024 9:34 AM Name: Richard Mclean.  DOB: 1975/11/20 MRN: [id number] Unit: [id number]/[id number]  CC: Suicidal ideation and bizarre behavior  Richard Mclean is a 48 y.o. male  with a documented PMH of  MDD with psychosis, EtOH abuse, and recent discharge from [facility] on 05/07/2024, who was transferred from [facility]  to [facility] (05/11/2024) for voluntary admission for suicidal ideation and bizarre behavior.  DAY: 3  Principal Problem: Schizophrenia, unspecified (HCC) Diagnosis: Principal Problem:   Schizophrenia, unspecified (HCC) Active Problems:   MDD (major depressive disorder)  Overnight Events: PRN trazodone  given for sleep. Slept 8.75 hours. No events per chart review.   Today on interview, pt seen on unit. Patient feels alright this morning. Plans to do laundry today and may try to clean the floor of his room with one of his towel since there is a fair amount of dust on the floor. Patient denies VH, but notes that he believes in spirits and that he can distinguish between good and bad spirits as part of his religious beliefs. When asked if he literally sees spirits he denies and elaborates that it is more of a feeling. He also notes that the feeling is more intense when he drinks alcohol. When asked about his alcohol use the patient changes topics to calling his mom yesterday. When the question is repeated he only notes that he is a social drinker and does not elaborate further. Patient denies AH and paranoia. Patient notes that he is glad to be off of the street then starts to talk about how he is a duke and has royal blood. Patient is able to recall some details of his recent psychiatric hospitalization and returning to his mother's house afterwards which he calls a poison house. Patient denies SI/HI. Patient has several odd hand and arm gestures that he reports are part of his religious beliefs.   ROS No physical complaints at this time.  HISTORY  Past  Medical History:  Past Medical History:  Diagnosis Date   Depression     History reviewed. No pertinent surgical history. Family History:  Family History  Problem Relation Age of Onset   Alcohol abuse Mother    Depression Mother    Mental illness Mother    Mental illness Father    Mental illness Sister    HIV/AIDS Brother    Mental illness Maternal Grandmother    Social History:  Social History   Substance and Sexual Activity  Alcohol Use Yes   Comment: Malt liquor or liquor every other weekend     Social History   Substance and Sexual Activity  Drug Use Not Currently   Types: Marijuana    Social History   Socioeconomic History   Marital status: Single    Spouse name: Not on file   Number of children: 0   Years of education: GED   Highest education level: GED or equivalent  Occupational History   Occupation: Unemployed  Tobacco Use   Smoking status: Every Day    Types: Cigarettes   Smokeless tobacco: Never  Vaping Use   Vaping status: Every Day  Substance and Sexual Activity   Alcohol use: Yes    Comment: Malt liquor or liquor every other weekend   Drug use: Not Currently    Types: Marijuana   Sexual activity: Yes  Other Topics Concern   Not on file  Social History Narrative   ** Merged History Encounter **  Completed social determinants screening and administered PHQ 9 along with Biopsychosocial assessment.    Social Drivers of Health   Financial Resource Strain: High Risk (05/04/2019)   Overall Financial Resource Strain (CARDIA)    Difficulty of Paying Living Expenses: Hard  Food Insecurity: Food Insecurity Present (05/11/2024)   Hunger Vital Sign    Worried About Running Out of Food in the Last Year: Sometimes true    Ran Out of Food in the Last Year: Sometimes true  Transportation Needs: No Transportation Needs (05/11/2024)   PRAPARE - Administrator, Civil Service (Medical): No    Lack of Transportation (Non-Medical): No   Physical Activity: Sufficiently Active (05/04/2019)   Exercise Vital Sign    Days of Exercise per Week: 7 days    Minutes of Exercise per Session: 30 min  Stress: Stress Concern Present (05/04/2019)   [employer] of Occupational Health - Occupational Stress Questionnaire    Feeling of Stress : Very much  Social Connections: Socially Isolated (05/04/2019)   Social Connection and Isolation Panel    Frequency of Communication with Friends and Family: Once a week    Frequency of Social Gatherings with Friends and Family: Never    Attends Religious Services: Never    Database administrator or Organizations: No    Attends Banker Meetings: Never    Marital Status: Never married   Additional Social History:  Patient is unhoused in [place]. Per mom's report patient has not been able to consistently hold a job for several years. Per patient and mom's report patient struggled in school growing up and left school at some point in highschool. Mom thinks patient may have completed his GED while incarcerated.    Current Medications: Current Facility-Administered Medications  Medication Dose Route Frequency Provider Last Rate Last Admin   acetaminophen  (TYLENOL ) tablet 650 mg  650 mg Oral Q6H PRN Motley-Mangrum, Jadeka A, PMHNP       alum & mag hydroxide-simeth (MAALOX/MYLANTA) 200-200-20 MG/5ML suspension 30 mL  30 mL Oral Q4H PRN Motley-Mangrum, Jadeka A, PMHNP       ARIPiprazole  (ABILIFY ) tablet 5 mg  5 mg Oral Daily Ji, Andrew, MD   5 mg at 05/14/24 9251   haloperidol  (HALDOL ) tablet 5 mg  5 mg Oral TID PRN Motley-Mangrum, Jadeka A, PMHNP       And   diphenhydrAMINE  (BENADRYL ) capsule 50 mg  50 mg Oral TID PRN Motley-Mangrum, Jadeka A, PMHNP       haloperidol  lactate (HALDOL ) injection 5 mg  5 mg Intramuscular TID PRN Motley-Mangrum, Jadeka A, PMHNP       And   diphenhydrAMINE  (BENADRYL ) injection 50 mg  50 mg Intramuscular TID PRN Motley-Mangrum, Jadeka A, PMHNP       And    LORazepam  (ATIVAN ) injection 2 mg  2 mg Intramuscular TID PRN Motley-Mangrum, Jadeka A, PMHNP       haloperidol  lactate (HALDOL ) injection 10 mg  10 mg Intramuscular TID PRN Motley-Mangrum, Jadeka A, PMHNP       And   diphenhydrAMINE  (BENADRYL ) injection 50 mg  50 mg Intramuscular TID PRN Motley-Mangrum, Jadeka A, PMHNP       And   LORazepam  (ATIVAN ) injection 2 mg  2 mg Intramuscular TID PRN Motley-Mangrum, Jadeka A, PMHNP       FLUoxetine  (PROZAC ) capsule 20 mg  20 mg Oral Daily Motley-Mangrum, Jadeka A, PMHNP   20 mg at 05/14/24 0747   hydrOXYzine  (ATARAX ) tablet 25 mg  25 mg Oral TID PRN Motley-Mangrum, Jadeka A, PMHNP       magnesium  hydroxide (MILK OF MAGNESIA) suspension 30 mL  30 mL Oral Daily PRN Motley-Mangrum, Jadeka A, PMHNP       nicotine  polacrilex (NICORETTE ) gum 2 mg  2 mg Oral PRN Butler, Laura N, MD   2 mg at 05/14/24 0749   traZODone  (DESYREL ) tablet 50 mg  50 mg Oral QHS PRN Motley-Mangrum, Jadeka A, PMHNP   50 mg at 05/13/24 2127    OBJECTIVE  BP (!) 120/92 (BP Location: Right Arm)   Pulse 81   Temp 98.6 F (37 C) (Oral)   Resp 18   Ht 5' 9 (1.753 m)   Wt 87.8 kg   SpO2 98%   BMI 28.59 kg/m   Physical Exam Physical Exam Vitals and nursing note reviewed.  Constitutional:      General: He is not in acute distress. HENT:     Head: Normocephalic and atraumatic.  Pulmonary:     Effort: Pulmonary effort is normal.  Musculoskeletal:        General: Normal range of motion.     Cervical back: Normal range of motion.  Neurological:     Mental Status: He is alert.      Psychiatric Specialty Exam: Presentation Presentation  General Appearance:Appropriate for Environment Eye Contact:Fair Speech:Clear and Coherent, Normal Rate Volume:Normal  Mood and Affect  Mood:Euthymic Affect:Congruent  Thought Process  Thought Process:Linear, Other (comment) (Has some periods of disorganized thought that are less frequent than previous interviews) Descriptions of  Associations:Tangential  Thought Content Suicidal Thoughts:No Homicidal Thoughts:No Hallucinations:None Ideas of Reference:Delusions Thought Content:Delusions  Sensorium  Memory:Immediate Fair, Recent Fair Judgment:Poor Insight:Poor  Executive Functions  Orientation:Full (Time, Place and Person) Language:Fair Concentration:Fair Attention:Fair Recall:Fair Fund of Knowledge:Poor  Psychomotor Activity  Psychomotor Activity:Psychomotor Activity: Normal  Assets  Assets:Physical Health  Sleep  Quality:Good Documented sleep last 24 hours: 8.75   Lab Results:  No results found for this or any previous visit (from the past 48 hours).  Blood Alcohol level:  Lab Results  Component Value Date   [facility] <15 05/11/2024   ETH <15 05/02/2024    Metabolic Disorder Labs: Lab Results  Component Value Date   HGBA1C 5.4 05/04/2024   MPG 108 05/04/2024   No results found for: PROLACTIN Lab Results  Component Value Date   CHOL 182 05/04/2024   TRIG 84 05/04/2024   HDL 52 05/04/2024   CHOLHDL 3.5 05/04/2024   VLDL 17 05/04/2024   LDLCALC 113 (H) 05/04/2024   LDLCALC 135 (H) 05/02/2024   ASSESSMENT / PLAN  Diagnoses / Active Problems: Principal Problem:   Schizophrenia, unspecified (HCC) Active Problems:   MDD (major depressive disorder)  Assessment: Richard Mclean. Is a 48 year old man with history of MDD with psychotic features and recent psychiatric hospitalization earlier this month who is admitted for suicidal ideation and bizarre behavior. Unspecified schizophrenia spectrum and other psychotic disorder disorder is the leading differential  given patient answers are disorganized, reports of visual hallucinations and paranoid ideation at time of admission. Increasing suspicion that visual hallucinations may be odd interpretation/communication of events due to possible IDD or schizotypal personality disorder given patients elaboration on his beliefs today. Considering  schizotypal personality disorder given report of bizarre behavior since childhood and current endorsement of odd beliefs; and IDD given patient reports of repeated difficulty in school, not completing highschool, and report of possible IEP in childhood. Also considering ADHD in differential given  patient's history of struggling in school, challenging childhood behavior at home per mom's report, and poor concentration and attention span currently. Patient with history of alcohol use disorder. Given recent discharge from [facility] 4 days prior to admission and negative ethanol at admission, unlikely that alcohol withdrawal is contributing to current psychiatric picture. He continues to tolerate Abilify  well with no reported side effects at this time. Will plan to increase to Abilify  10 mg tomorrow with goal dose of 15-20 mg depending on symptom response and tolerability.    PLAN: Safety and Monitoring:             --  Voluntary admission to inpatient psychiatric unit for safety, stabilization and treatment             -- Daily contact with patient to assess and evaluate symptoms and progress in treatment             -- Patient's case to be discussed in multi-disciplinary team meeting             -- Observation Level : q15 minute checks             -- Vital signs:  q12 hours             -- Precautions: suicide, elopement, and assault Agitation PRNs acetaminophen , 650 mg, Q6H PRN alum & mag hydroxide-simeth, 30 mL, Q4H PRN haloperidol , 5 mg, TID PRN  And diphenhydrAMINE , 50 mg, TID PRN haloperidol  lactate, 5 mg, TID PRN  And diphenhydrAMINE , 50 mg, TID PRN  And LORazepam , 2 mg, TID PRN haloperidol  lactate, 10 mg, TID PRN  And diphenhydrAMINE , 50 mg, TID PRN  And LORazepam , 2 mg, TID PRN hydrOXYzine , 25 mg, TID PRN magnesium  hydroxide, 30 mL, Daily PRN traZODone , 50 mg, QHS PRN       2. 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 Medical Issues Being Addressed:  None - Would benefit from establishing with a PCP  4. Discharge Planning:  Tentative Date: TBA  Barrier: Psychosis Location: Homeless Shelter   Treatment Plan Summary: I certify that inpatient services furnished can reasonably be expected to improve the patient's condition.   The risks/benefits/side-effects/alternatives to this medication were discussed in detail with the patient and time was given for questions. The patient consents to medication trial. The patient consents to medication trial. FDA black box warnings, if present, were discussed. Metabolic profile and EKG monitoring obtained while on an atypical antipsychotic  Encouraged patient to participate in unit milieu and in scheduled group therapies  Short Term Goals: Ability to identify changes in lifestyle to reduce recurrence of condition will improve, Ability to verbalize feelings will improve, Ability to disclose and discuss suicidal ideas, Ability to demonstrate self-control will improve, Ability to identify and develop effective coping behaviors will improve, Ability to maintain clinical measurements within normal limits will improve, Compliance with prescribed medications will improve, and Ability to identify triggers associated with substance abuse/mental health issues will improve Long Term Goals: Improvement in symptoms so as ready for discharge Social work and case management to assist with discharge planning and identification of hospital follow-up needs prior to discharge Estimated LOS: 5-7 days Discharge Concerns: Need to establish a safety plan; Medication compliance and effectiveness Discharge Goals: Return home with outpatient referrals for mental  health follow-up including medication  management/psychotherapy   Patient's case was discussed with Attending, see attestation for more information.   Signed: Logan Dross, MS4 [employer]

## 2024-05-14 NOTE — Group Note (Signed)
 Date:  05/14/2024 Time:  4:11 AM  Group Topic/Focus:  Wrap-Up Group:   The focus of this group is to help patients review their daily goal of treatment and discuss progress on daily workbooks.    Participation Level:  Active  Participation Quality:  Appropriate  Affect:  Appropriate  Cognitive:  Appropriate  Insight: Appropriate  Engagement in Group:  Engaged  Modes of Intervention:  Discussion, Socialization, and Support  Additional Comments:  Patient attended NA  Eward Mace 05/14/2024, 4:11 AM

## 2024-05-15 NOTE — Group Note (Signed)
 Date:  05/15/2024 Time:  9:27 AM  Group Topic/Focus: Positivity  The goal of this group was for each person to write their name in the middle of a piece of paper and pass it around the dayroom to their peers. Each person was to write a positive comment anonymously about each other. Each patient was allowed to share what was said and reflect on their goals for today.   Participation Level:  Active  Participation Quality:  Appropriate, Sharing, and Supportive  Affect:  Excited  Cognitive:  Alert, Appropriate, and Oriented  Insight: Appropriate and Improving  Engagement in Group:  Engaged, Improving, and Supportive  Modes of Intervention:  Activity, Socialization, and Support  Additional Comments:  Richard Mclean actively participated in this group activity. He wrote kind things on his peers papers and made a lot of his peers laugh.  Richard Mclean HERO Gareth Fitzner 05/15/2024, 9:27 AM

## 2024-05-15 NOTE — Group Note (Signed)
 Date:  05/15/2024 Time:  5:04 PM  Group Topic/Focus:  Dimensions of Wellness:   The focus of this group is to introduce the topic of wellness and discuss the role each dimension of wellness plays in total health.Patients discussed triggers that can cause setbacks in recovery and discussed deep breathing as a coping skill. Wellness Toolbox:   The focus of this group is to discuss various aspects of wellness, balancing those aspects and exploring ways to increase the ability to experience wellness.  Patients will create a wellness toolbox for use upon discharge.     Participation Level:  Minimal  Participation Quality:  Appropriate  Affect:  Appropriate and Flat  Cognitive:  Alert, Appropriate, and Oriented  Insight: Improving and Lacking  Engagement in Group:  Engaged, Improving, and Lacking  Modes of Intervention:  Discussion, Education, Problem-solving, and Socialization  Additional Comments:  Richard Mclean did attend group with limited verbal participation. He did nod along in agreement and appeared engaged in the conversation.  Richard Mclean 05/15/2024, 5:04 PM

## 2024-05-15 NOTE — Progress Notes (Signed)
(  Sleep Hours) - 6 hours (Any PRNs that were needed, meds refused, or side effects to meds)- none (Any disturbances and when (visitation, over night) (Concerns raised by the patient)- none (SI/HI/AVH)- denies

## 2024-05-15 NOTE — Plan of Care (Signed)

## 2024-05-15 NOTE — Group Note (Signed)
 Date:  05/15/2024 Time:  4:13 PM  Group Topic/Focus:  Exploring Lonliness    Participation Level:  Active  Participation Quality:  Appropriate  Affect:  Appropriate  Cognitive:  Alert  Insight: Appropriate  Engagement in Group:  Engaged  Modes of Intervention:  Discussion and handout  Additional Comments:    Soleia Badolato M Brennan Karam 05/15/2024, 4:13 PM

## 2024-05-15 NOTE — Plan of Care (Signed)
  Problem: Education: Goal: Mental status will improve Outcome: Not Progressing Goal: Verbalization of understanding the information provided will improve Outcome: Not Progressing   Problem: Activity: Goal: Interest or engagement in activities will improve Outcome: Not Progressing

## 2024-05-15 NOTE — [facility] Group Notes (Signed)
 [facility] Group Notes:  (Nursing/MHT/Case Management/Adjunct)  Date:  05/15/2024  Time:  2000  Type of Therapy:  Alcoholics Anonymous Meeting  Participation Level:  Active  Participation Quality:  Appropriate, Attentive, Sharing, and Supportive  Affect:  Appropriate  Cognitive:  Alert  Insight:  Improving  Engagement in Group:  Engaged  Modes of Intervention:  Clarification, Education, and Support  Summary of Progress/Problems:  Richard Mclean 05/15/2024, 9:55 PM

## 2024-05-15 NOTE — Progress Notes (Signed)
   05/15/24 1400  Psych Admission Type (Psych Patients Only)  Admission Status Voluntary  Psychosocial Assessment  Patient Complaints None  Eye Contact Brief  Facial Expression Animated  Affect Appropriate to circumstance  Speech Logical/coherent  Interaction Assertive  Motor Activity Slow  Appearance/Hygiene Unremarkable  Behavior Characteristics Cooperative  Mood Pleasant  Thought Process  Coherency WDL  Content WDL  Delusions None reported or observed  Perception WDL  Hallucination None reported or observed  Judgment WDL  Confusion None  Danger to Self  Current suicidal ideation? Denies  Description of Suicide Plan No Plan  Agreement Not to Harm Self Yes  Description of Agreement Verbal  Danger to Others  Danger to Others None reported or observed

## 2024-05-15 NOTE — Progress Notes (Signed)
 [facility] MD Progress Note  Date: 05/15/2024 9:34 AM Name: Jermiah Soderman.  DOB: 09-04-1976 MRN: [id number] Unit: [id number]/[id number]  CC: Suicidal ideation and bizarre behavior  Mithran Strike is a 48 y.o. male  with a documented PMH of  MDD with psychosis, EtOH abuse, and recent discharge from [facility] on 05/07/2024, who was transferred from [facility]  to [facility] (05/11/2024) for voluntary admission for suicidal ideation and bizarre behavior.  DAY: 4  Principal Problem: Schizophrenia, unspecified (HCC) Diagnosis: Principal Problem:   Schizophrenia, unspecified (HCC) Active Problems:   MDD (major depressive disorder)  Overnight Events: No events per chart review. Slept 6 hours. No PRNs.  Today on interview, pt seen on unit. Patient reports that he prefers to speak in the hall because his room is in a bad state. When asked to elaborate he notes that there is high voltage and heavy metals, like lead that he is sensitive too. He notes that he see the heavy voltage outside of his window. Patient then states that he is just making statements. Patient noted that his meds make him thirsty so he drinks more water  and crystal light. Patient reports that he urinates about 5 times a day and denies waking up in the middle of the night to use the bathroom. Patient rapidly changes topic about how his family used to own slaves, and that they still do but they need to pay them minimum wage now. Patient remarks that anyone wearing gray knows about this. Patient denies SI and HI. When asked about AVH he denies them and states that since he has been praying the demons don't come by. Patient then asks to leave to go get nicotine  gum and ends the interview. Patient was able to sit calmly and relatively still during the interview with minimal fidgeting.   Interviewer looked out patient's window after interview and noted what appear to be power or telephone lines between the trees and a red warning sign with the word  DANGER visible.     Review of Systems  Gastrointestinal:  Negative for abdominal pain, diarrhea, nausea and vomiting.  Genitourinary:  Negative for dysuria, frequency and urgency.  Musculoskeletal:  Negative for myalgias.  Neurological:  Negative for dizziness.   No physical complaints at this time.  HISTORY  Past Medical History:  Past Medical History:  Diagnosis Date   Depression     History reviewed. No pertinent surgical history. Family History:  Family History  Problem Relation Age of Onset   Alcohol abuse Mother    Depression Mother    Mental illness Mother    Mental illness Father    Mental illness Sister    HIV/AIDS Brother    Mental illness Maternal Grandmother    Social History:  Social History   Substance and Sexual Activity  Alcohol Use Yes   Comment: Malt liquor or liquor every other weekend     Social History   Substance and Sexual Activity  Drug Use Not Currently   Types: Marijuana    Social History   Socioeconomic History   Marital status: Single    Spouse name: Not on file   Number of children: 0   Years of education: GED   Highest education level: GED or equivalent  Occupational History   Occupation: Unemployed  Tobacco Use   Smoking status: Every Day    Types: Cigarettes   Smokeless tobacco: Never  Vaping Use   Vaping status: Every Day  Substance and Sexual Activity  Alcohol use: Yes    Comment: Malt liquor or liquor every other weekend   Drug use: Not Currently    Types: Marijuana   Sexual activity: Yes  Other Topics Concern   Not on file  Social History Narrative   ** Merged History Encounter **       Completed social determinants screening and administered PHQ 9 along with Biopsychosocial assessment.    Social Drivers of Health   Financial Resource Strain: High Risk (05/04/2019)   Overall Financial Resource Strain (CARDIA)    Difficulty of Paying Living Expenses: Hard  Food Insecurity: Food Insecurity Present  (05/11/2024)   Hunger Vital Sign    Worried About Running Out of Food in the Last Year: Sometimes true    Ran Out of Food in the Last Year: Sometimes true  Transportation Needs: No Transportation Needs (05/11/2024)   PRAPARE - Administrator, Civil Service (Medical): No    Lack of Transportation (Non-Medical): No  Physical Activity: Sufficiently Active (05/04/2019)   Exercise Vital Sign    Days of Exercise per Week: 7 days    Minutes of Exercise per Session: 30 min  Stress: Stress Concern Present (05/04/2019)   [employer] of Occupational Health - Occupational Stress Questionnaire    Feeling of Stress : Very much  Social Connections: Socially Isolated (05/04/2019)   Social Connection and Isolation Panel    Frequency of Communication with Friends and Family: Once a week    Frequency of Social Gatherings with Friends and Family: Never    Attends Religious Services: Never    Database administrator or Organizations: No    Attends Banker Meetings: Never    Marital Status: Never married   Additional Social History:  Patient is unhoused in [place]. Per mom's report patient has not been able to consistently hold a job for several years. Per patient and mom's report patient struggled in school growing up and left school at some point in highschool. Mom thinks patient may have completed his GED while incarcerated.    Current Medications: Current Facility-Administered Medications  Medication Dose Route Frequency Provider Last Rate Last Admin   acetaminophen  (TYLENOL ) tablet 650 mg  650 mg Oral Q6H PRN Motley-Mangrum, Jadeka A, PMHNP       alum & mag hydroxide-simeth (MAALOX/MYLANTA) 200-200-20 MG/5ML suspension 30 mL  30 mL Oral Q4H PRN Motley-Mangrum, Jadeka A, PMHNP       ARIPiprazole  (ABILIFY ) tablet 10 mg  10 mg Oral Daily Butler, Laura N, MD   10 mg at 05/15/24 9161   haloperidol  (HALDOL ) tablet 5 mg  5 mg Oral TID PRN Motley-Mangrum, Jadeka A, PMHNP        And   diphenhydrAMINE  (BENADRYL ) capsule 50 mg  50 mg Oral TID PRN Motley-Mangrum, Jadeka A, PMHNP       haloperidol  lactate (HALDOL ) injection 5 mg  5 mg Intramuscular TID PRN Motley-Mangrum, Jadeka A, PMHNP       And   diphenhydrAMINE  (BENADRYL ) injection 50 mg  50 mg Intramuscular TID PRN Motley-Mangrum, Jadeka A, PMHNP       And   LORazepam  (ATIVAN ) injection 2 mg  2 mg Intramuscular TID PRN Motley-Mangrum, Jadeka A, PMHNP       haloperidol  lactate (HALDOL ) injection 10 mg  10 mg Intramuscular TID PRN Motley-Mangrum, Jadeka A, PMHNP       And   diphenhydrAMINE  (BENADRYL ) injection 50 mg  50 mg Intramuscular TID PRN Motley-Mangrum, Jadeka A, PMHNP  And   LORazepam  (ATIVAN ) injection 2 mg  2 mg Intramuscular TID PRN Motley-Mangrum, Jadeka A, PMHNP       FLUoxetine  (PROZAC ) capsule 20 mg  20 mg Oral Daily Motley-Mangrum, Jadeka A, PMHNP   20 mg at 05/15/24 9161   hydrOXYzine  (ATARAX ) tablet 25 mg  25 mg Oral TID PRN Motley-Mangrum, Jadeka A, PMHNP       magnesium  hydroxide (MILK OF MAGNESIA) suspension 30 mL  30 mL Oral Daily PRN Motley-Mangrum, Jadeka A, PMHNP       nicotine  polacrilex (NICORETTE ) gum 2 mg  2 mg Oral PRN Butler, Laura N, MD   2 mg at 05/15/24 0851   traZODone  (DESYREL ) tablet 50 mg  50 mg Oral QHS PRN Motley-Mangrum, Jadeka A, PMHNP   50 mg at 05/13/24 2127    OBJECTIVE  BP 127/84 (BP Location: Right Arm)   Pulse 80   Temp 98.2 F (36.8 C) (Oral)   Resp 18   Ht 5' 9 (1.753 m)   Wt 87.8 kg   SpO2 99%   BMI 28.59 kg/m   Physical Exam Physical Exam Vitals and nursing note reviewed.  Constitutional:      General: He is not in acute distress. HENT:     Head: Normocephalic and atraumatic.  Pulmonary:     Effort: Pulmonary effort is normal.  Musculoskeletal:        General: Normal range of motion.     Cervical back: Normal range of motion.  Neurological:     Mental Status: He is alert.     Gait: Gait normal.      Psychiatric Specialty  Exam: Presentation Presentation  General Appearance:Appropriate for Environment, Casual Eye Contact:Fair Speech:Clear and Coherent, Normal Rate Volume:Normal  Mood and Affect  Mood:Euthymic Affect:Congruent  Thought Process  Thought Process:Disorganized (Some periods of linear thought) Descriptions of Associations:Tangential  Thought Content Suicidal Thoughts:No Homicidal Thoughts:No Hallucinations:None Ideas of Reference:Delusions Thought Content:Delusions, Paranoid Ideation  Sensorium  Memory:Recent Fair, Immediate Fair Judgment:Poor Insight:Poor  Executive Functions  Orientation:Full (Time, Place and Person) Language:Fair Concentration:Fair Attention:Fair Recall:Fair Fund of Knowledge:Poor  Psychomotor Activity  Psychomotor Activity:Psychomotor Activity: Normal  Assets  Assets:Physical Health  Sleep  Quality:Fair Documented sleep last 24 hours: 6   Lab Results:  No results found for this or any previous visit (from the past 48 hours).  Blood Alcohol level:  Lab Results  Component Value Date   [facility] <15 05/11/2024   ETH <15 05/02/2024    Metabolic Disorder Labs: Lab Results  Component Value Date   HGBA1C 5.4 05/04/2024   MPG 108 05/04/2024   No results found for: PROLACTIN Lab Results  Component Value Date   CHOL 182 05/04/2024   TRIG 84 05/04/2024   HDL 52 05/04/2024   CHOLHDL 3.5 05/04/2024   VLDL 17 05/04/2024   LDLCALC 113 (H) 05/04/2024   LDLCALC 135 (H) 05/02/2024   ASSESSMENT / PLAN  Diagnoses / Active Problems: Principal Problem:   Schizophrenia, unspecified (HCC) Active Problems:   MDD (major depressive disorder)  Assessment: Sherida [name]. Is a 48 year old man with history of MDD with psychotic features and recent psychiatric hospitalization earlier this month who is admitted for suicidal ideation and bizarre behavior. Unspecified schizophrenia spectrum and other psychotic disorder disorder is the leading differential   given patient answers are disorganized, reports of visual hallucinations and paranoid ideation at time of admission. Increasing suspicion that visual hallucinations and delusions may instead be odd interpretation/communication of real world events and  stimuli due to possible IDD or schizotypal personality disorder. 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 Suspicion for ADHD decreased at this time given patient has had improved concentration and less fidgeting during interviews for the past 2 days. He continues to tolerate Abilify  well. Reports increased liquid intake, but is having a reasonable amount of reported urinary output, no dysruia and no nocturnal symptoms. Will continue to monitor. Will plan to increase to Abilify  10 mg today with goal dose of 15-20 mg depending on symptom response and tolerability.    PLAN: Safety and Monitoring:             --  Voluntary admission to inpatient psychiatric unit for safety, stabilization and treatment             -- Daily contact with patient to assess and evaluate symptoms and progress in treatment             -- Patient's case to be discussed in multi-disciplinary team meeting             -- Observation Level : q15 minute checks             -- Vital signs:  q12 hours             -- Precautions: suicide, elopement, and assault PRNs Tylenol  Q6H PRN for mild pain Maalox/Mylanta Q4H PRN for indigestion  Haldol  5 mg and Benadryl  50 mg oral Q8H PRN for mild agitation  Haldol  10 mg, Benadryl  50 mg and Ativan  2 mg IM Q8H PRN for severe agitation Hydroxyzine  25 mg oral Q6H PRN for anxiety/agitation  Milk of magnesia daily PRN for constipation  Trazodone  50 mg at bedtime PRN for sleep    2. 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 Medical Issues Being Addressed:  None - Would benefit from establishing with a PCP  4. Discharge Planning:  Tentative Date: 05/15/2024  Barrier: Psychosis Location: Homeless Shelter   Treatment Plan Summary: I certify that inpatient services furnished can reasonably be expected to improve the patient's condition.   The risks/benefits/side-effects/alternatives to this medication were discussed in detail with the patient and time was given for questions. The patient consents to medication trial. The patient consents to medication trial. FDA black box warnings, if present, were discussed. Metabolic profile and EKG monitoring obtained while on an atypical antipsychotic  Encouraged patient to participate in unit milieu and in scheduled group therapies  Short Term Goals: Ability to identify changes in lifestyle to reduce recurrence of condition will improve, Ability to verbalize feelings will improve, Ability to disclose and discuss suicidal ideas, Ability to demonstrate self-control will improve, Ability to identify and develop effective coping behaviors will improve, Ability to maintain clinical measurements within normal limits will improve, Compliance with prescribed medications will improve, and Ability to identify triggers associated with substance abuse/mental health issues will improve Long Term Goals: Improvement in symptoms so as ready for discharge Social work and case management to assist with discharge planning and identification of hospital follow-up needs prior to discharge Estimated LOS: 5-7 days Discharge Concerns: Need to establish a safety plan; Medication compliance  and effectiveness Discharge Goals: Return home with outpatient referrals for  mental health follow-up including medication management/psychotherapy   Patient's case was discussed with Attending, see attestation for more information.   Signed: Logan Dross, MS4 [employer]

## 2024-05-15 NOTE — Group Note (Signed)
 Date:  05/15/2024 Time:  10:43 AM  Group Topic/Focus:  Goals Group:   The focus of this group is to help patients establish daily goals to achieve during treatment and discuss how the patient can incorporate goal setting into their daily lives to aide in recovery.    Participation Level:  Active  Participation Quality:  Appropriate and Sharing  Affect:  Appropriate  Cognitive:  Alert, Appropriate, and Oriented  Insight: Appropriate and Improving  Engagement in Group:  Engaged and Improving  Modes of Intervention:  Discussion and Socialization  Additional Comments:  Richard Mclean shares his goal today is to work on something called the virtue of patience.  [name] 05/15/2024, 10:43 AM

## 2024-05-16 NOTE — Progress Notes (Signed)
   05/16/24 0935  Psych Admission Type (Psych Patients Only)  Admission Status Voluntary  Psychosocial Assessment  Patient Complaints None  Eye Contact Fair  Facial Expression Animated  Affect Appropriate to circumstance  Speech Logical/coherent  Interaction Assertive  Motor Activity Slow  Appearance/Hygiene In scrubs  Behavior Characteristics Cooperative  Mood Pleasant  Thought Process  Coherency WDL  Content WDL  Delusions None reported or observed  Perception WDL  Hallucination None reported or observed  Judgment WDL  Confusion None  Danger to Self  Current suicidal ideation? Denies  Agreement Not to Harm Self Yes  Description of Agreement Verbal  Danger to Others  Danger to Others None reported or observed

## 2024-05-16 NOTE — Progress Notes (Signed)
(  Sleep Hours) - 4.5  (Any PRNs that were needed, meds refused, or side effects to meds)- Atarax , trazodone  (Any disturbances and when (visitation, over night) (Concerns raised by the patient)- none  (SI/HI/AVH)- denies

## 2024-05-16 NOTE — Plan of Care (Signed)
  Problem: Activity: Goal: Interest or engagement in activities will improve Outcome: Progressing Goal: Sleeping patterns will improve Outcome: Progressing   Problem: Coping: Goal: Ability to verbalize frustrations and anger appropriately will improve Outcome: Progressing Goal: Ability to demonstrate self-control will improve Outcome: Progressing   Problem: Safety: Goal: Periods of time without injury will increase Outcome: Progressing

## 2024-05-16 NOTE — Group Note (Unsigned)
 Date:  05/17/2024 Time:  12:12 AM  Group Topic/Focus:  Wrap-Up Group:   The focus of this group is to help patients review their daily goal of treatment and discuss progress on daily workbooks.    Participation Level:  None  Participation Quality:  Inattentive  Affect:  Appropriate  Cognitive:  Lacking  Insight: None  Engagement in Group:  None  Modes of Intervention:  Discussion  Additional Comments:  Patient attended wrap up session , but did not participate  Bari Moats 05/17/2024, 12:12 AM

## 2024-05-16 NOTE — Group Note (Signed)
 Date:  05/16/2024 Time:  10:15 AM  Group Topic/Focus:  Goals Group:   The focus of this group is to help patients establish daily goals to achieve during treatment and discuss how the patient can incorporate goal setting into their daily lives to aide in recovery.    Participation Level:  Active  Participation Quality:  Appropriate  Affect:  Appropriate  Cognitive:  Appropriate  Insight: Appropriate  Engagement in Group:  Engaged  Modes of Intervention:  Discussion  Additional Comments:  Pt stated he working on his food intake  Nat Rummer 05/16/2024, 10:15 AM

## 2024-05-16 NOTE — Group Note (Signed)
 LCSW Group Therapy Note  @TD @   10:00am-11:00am  Type of Therapy and Topic:  Group Therapy: Gratitude  Participation Level:  Active   Description of Group:   In this group, patients shared and discussed the importance of acknowledging the elements in their lives for which they are grateful and how this can positively impact their mood.  The group discussed how bringing the positive elements of their lives to the forefront of their minds can help with recovery from any illness, physical or mental.  An exercise was done as a group in which a list was made of gratitude items in order to encourage participants to consider other potential positives in their lives.  Therapeutic Goals: Patients will discuss quotes about gratitude and explore how a change of attitude can make life more joyful. Patients will identify one or more items for which they are grateful in each of 6 categories:  people, experiences, things, places, skills, and other. Patients will discuss how it is possible to seek out gratitude in even bad situations. Patients will explore how the lack of gratitude can bring them down.   Summary of Patient Progress:  The patient shared that he is grateful for himself.  Patient's reaction to the group was engaged.  Therapeutic Modalities:   Solution-Focused Therapy Activity  Hiep Ollis O Jocilyn Trego, LCSWA 05/16/2024  1:15 PM

## 2024-05-16 NOTE — Plan of Care (Signed)

## 2024-05-16 NOTE — Progress Notes (Signed)
 [facility] MD Progress Note  Date: 05/16/2024 10:43 AM Name: Richard Mclean.  DOB: 1976/01/05 MRN: [id number] Unit: [id number]/[id number]  Reason for admission: Suicidal ideation and bizarre behavior  Identifying Information: Richard Mclean is a 48 y.o. male  with a documented PMH of  MDD with psychosis, EtOH abuse, and recent discharge from [facility] on 05/07/2024, who was transferred from [facility]  to [facility] (05/11/2024) for voluntary admission for suicidal ideation and bizarre behavior.   DAY: 5  Principal Problem: Schizophrenia, unspecified (HCC) Diagnosis: Principal Problem:   Schizophrenia, unspecified (HCC) Active Problems:   MDD (major depressive disorder)  Interval Events: Per chart review the patient has been attending groups with appropriate engagement. Noted by staff to be animated but largely logical and coherent in his speech. Continues to deny SI, HI and AVH. Medication compliant and denying side effects. No behavioral concerns reported. He did only sleep 4.5 hrs overnight.   Today on interview The patient reports he is pretty good and comments about improvements in ability to talk to others. Again makes some odd comments regarding religion and the state of the world in general but able to redirect to discussion about his medications. He is somewhat tired, but notes poor sleep last night and is not sure this is related to medications. He otherwise denies side effects and feels overall well. Eating well. No SI, HI or AVH reported.    Review of Systems  Gastrointestinal:  Negative for abdominal pain, diarrhea, nausea and vomiting.  Genitourinary:  Negative for dysuria, frequency and urgency.  Musculoskeletal:  Negative for myalgias.  Neurological:  Negative for dizziness.   No physical complaints at this time.  HISTORY  Past Medical History:  Past Medical History:  Diagnosis Date   Depression     History reviewed. No pertinent surgical history. Family History:  Family History   Problem Relation Age of Onset   Alcohol abuse Mother    Depression Mother    Mental illness Mother    Mental illness Father    Mental illness Sister    HIV/AIDS Brother    Mental illness Maternal Grandmother    Social History:  Social History   Substance and Sexual Activity  Alcohol Use Yes   Comment: Malt liquor or liquor every other weekend     Social History   Substance and Sexual Activity  Drug Use Not Currently   Types: Marijuana    Social History   Socioeconomic History   Marital status: Single    Spouse name: Not on file   Number of children: 0   Years of education: GED   Highest education level: GED or equivalent  Occupational History   Occupation: Unemployed  Tobacco Use   Smoking status: Every Day    Types: Cigarettes   Smokeless tobacco: Never  Vaping Use   Vaping status: Every Day  Substance and Sexual Activity   Alcohol use: Yes    Comment: Malt liquor or liquor every other weekend   Drug use: Not Currently    Types: Marijuana   Sexual activity: Yes  Other Topics Concern   Not on file  Social History Narrative   ** Merged History Encounter **       Completed social determinants screening and administered PHQ 9 along with Biopsychosocial assessment.    Social Drivers of Corporate investment banker Strain: High Risk (05/04/2019)   Overall Financial Resource Strain (CARDIA)    Difficulty of Paying Living Expenses: Hard  Food Insecurity: Food  Insecurity Present (05/11/2024)   Hunger Vital Sign    Worried About Running Out of Food in the Last Year: Sometimes true    Ran Out of Food in the Last Year: Sometimes true  Transportation Needs: No Transportation Needs (05/11/2024)   PRAPARE - Administrator, Civil Service (Medical): No    Lack of Transportation (Non-Medical): No  Physical Activity: Sufficiently Active (05/04/2019)   Exercise Vital Sign    Days of Exercise per Week: 7 days    Minutes of Exercise per Session: 30 min  Stress:  Stress Concern Present (05/04/2019)   [employer] of Occupational Health - Occupational Stress Questionnaire    Feeling of Stress : Very much  Social Connections: Socially Isolated (05/04/2019)   Social Connection and Isolation Panel    Frequency of Communication with Friends and Family: Once a week    Frequency of Social Gatherings with Friends and Family: Never    Attends Religious Services: Never    Database administrator or Organizations: No    Attends Banker Meetings: Never    Marital Status: Never married   Additional Social History:  Patient is unhoused in [place]. Per mom's report patient has not been able to consistently hold a job for several years. Per patient and mom's report patient struggled in school growing up and left school at some point in highschool. Mom thinks patient may have completed his GED while incarcerated.    Current Medications: Current Facility-Administered Medications  Medication Dose Route Frequency Provider Last Rate Last Admin   acetaminophen  (TYLENOL ) tablet 650 mg  650 mg Oral Q6H PRN Motley-Mangrum, Jadeka A, PMHNP       alum & mag hydroxide-simeth (MAALOX/MYLANTA) 200-200-20 MG/5ML suspension 30 mL  30 mL Oral Q4H PRN Motley-Mangrum, Jadeka A, PMHNP       ARIPiprazole  (ABILIFY ) tablet 10 mg  10 mg Oral Daily Larua Collier N, MD   10 mg at 05/16/24 0818   haloperidol  (HALDOL ) tablet 5 mg  5 mg Oral TID PRN Motley-Mangrum, Jadeka A, PMHNP       And   diphenhydrAMINE  (BENADRYL ) capsule 50 mg  50 mg Oral TID PRN Motley-Mangrum, Jadeka A, PMHNP       haloperidol  lactate (HALDOL ) injection 5 mg  5 mg Intramuscular TID PRN Motley-Mangrum, Jadeka A, PMHNP       And   diphenhydrAMINE  (BENADRYL ) injection 50 mg  50 mg Intramuscular TID PRN Motley-Mangrum, Jadeka A, PMHNP       And   LORazepam  (ATIVAN ) injection 2 mg  2 mg Intramuscular TID PRN Motley-Mangrum, Jadeka A, PMHNP       haloperidol  lactate (HALDOL ) injection 10 mg  10 mg  Intramuscular TID PRN Motley-Mangrum, Jadeka A, PMHNP       And   diphenhydrAMINE  (BENADRYL ) injection 50 mg  50 mg Intramuscular TID PRN Motley-Mangrum, Jadeka A, PMHNP       And   LORazepam  (ATIVAN ) injection 2 mg  2 mg Intramuscular TID PRN Motley-Mangrum, Jadeka A, PMHNP       FLUoxetine  (PROZAC ) capsule 20 mg  20 mg Oral Daily Motley-Mangrum, Jadeka A, PMHNP   20 mg at 05/16/24 0818   hydrOXYzine  (ATARAX ) tablet 25 mg  25 mg Oral TID PRN Motley-Mangrum, Jadeka A, PMHNP   25 mg at 05/16/24 0140   magnesium  hydroxide (MILK OF MAGNESIA) suspension 30 mL  30 mL Oral Daily PRN Motley-Mangrum, Jadeka A, PMHNP       nicotine  polacrilex (NICORETTE ) gum  2 mg  2 mg Oral PRN Avanish Cerullo N, MD   2 mg at 05/16/24 0818   traZODone  (DESYREL ) tablet 50 mg  50 mg Oral QHS PRN Motley-Mangrum, Jadeka A, PMHNP   50 mg at 05/15/24 2136    OBJECTIVE  BP 112/83 (BP Location: Right Arm)   Pulse 85   Temp 98.2 F (36.8 C) (Oral)   Resp 16   Ht 5' 9 (1.753 m)   Wt 87.8 kg   SpO2 99%   BMI 28.59 kg/m    Physical Exam Vitals and nursing note reviewed.  Constitutional:      General: He is not in acute distress. HENT:     Head: Normocephalic and atraumatic.  Eyes:     Extraocular Movements: Extraocular movements intact.  Pulmonary:     Effort: Pulmonary effort is normal.  Musculoskeletal:        General: Normal range of motion.     Cervical back: Normal range of motion.  Neurological:     General: No focal deficit present.     Mental Status: He is alert.     Gait: Gait normal.     Mental Status exam: Appearance: white male of slightly elevated BMI, shaved head, seen groomed in clothing with hospital gown overtop, walking through the milieu  Eye contact: good  Attitude towards examiner polite, engaged  Psychomotor: no agitation or retardation  Speech: rambling, but normal in rate and prosody Language: no delays  Mood: good Affect: congruent, euthymic, appropriate  Thought content:  denying SI and HI, no overt delusional content expressed, although continues to voice odd beliefs about religion and society at large  Thought Process: largely linear, continues to jump to various topics  Perception: denying AVH, not RTIS  Insight: fair  Judgement: fair   Orientation: x3 Attention/Concentration: fair - is easily distractible but redirectable  Memory/Cognition: memory grossly intact for recent and remote data Fund of Knowledge: below average based on historical data as well as conversation   Gait: steady MSK: normal tone   Lab Results:  No results found for this or any previous visit (from the past 48 hours).  Blood Alcohol level:  Lab Results  Component Value Date   [facility] <15 05/11/2024   ETH <15 05/02/2024    Metabolic Disorder Labs: Lab Results  Component Value Date   HGBA1C 5.4 05/04/2024   MPG 108 05/04/2024   No results found for: PROLACTIN Lab Results  Component Value Date   CHOL 182 05/04/2024   TRIG 84 05/04/2024   HDL 52 05/04/2024   CHOLHDL 3.5 05/04/2024   VLDL 17 05/04/2024   LDLCALC 113 (H) 05/04/2024   LDLCALC 135 (H) 05/02/2024   ASSESSMENT / PLAN  Diagnoses / Active Problems: Principal Problem:   Schizophrenia, unspecified (HCC) Active Problems:   MDD (major depressive disorder)  Assessment: Richard Mclean. Is a 47 year old man with history of MDD with psychotic features and recent psychiatric hospitalization earlier this month who is admitted for suicidal ideation and bizarre behavior. Unspecified schizophrenia spectrum and other psychotic disorder disorder is the leading differential  given patient answers are disorganized, reports of visual hallucinations and paranoid ideation at time of admission. Increasing suspicion that visual hallucinations and delusions may instead be odd interpretation/communication of real world events and stimuli due to possible IDD or schizotypal personality disorder. 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 Suspicion for ADHD decreased at this time given patient has had improved concentration and less fidgeting during interviews for the past 2 days. He continues to tolerate Abilify  well. Reports increased liquid intake, but is having a reasonable amount of reported urinary output, no dysruia and no nocturnal symptoms. Will continue to monitor. Abilify  was increased 10 mg on 8/22 with goal dose of  10 or 15 mg depending on symptom response and tolerability.   Today the patient remained with odd remarks and tangentiality, however was not seen RTIS, continued to deny AVH and was largely adherent to interview. Odd comments and mannerisms are increasingly suspected to be due to underlying personality traits (schizotypal suspected) plus IDD. We will continue to monitor thorugh the weekend and consider increasing abilify  to 15 mg. However, plan at this time is discharge on Monday to shelter. Patient does state he has things he needs to pick up at his mother's house.    PLAN: Safety and Monitoring:             --  Voluntary admission to inpatient psychiatric unit for safety, stabilization and treatment             -- Daily contact with patient to assess and evaluate symptoms and progress in treatment             -- Patient's case to be discussed in multi-disciplinary team meeting             -- Observation Level : q15 minute checks             -- Vital signs:  q12 hours             -- Precautions: suicide, elopement, and assault PRNs Tylenol  Q6H PRN for mild pain Maalox/Mylanta Q4H PRN for indigestion  Haldol  5 mg and Benadryl  50 mg oral Q8H PRN for mild agitation  Haldol  10 mg, Benadryl  50 mg and Ativan  2 mg IM Q8H PRN for severe agitation Hydroxyzine  25 mg oral Q6H PRN for  anxiety/agitation  Milk of magnesia daily PRN for constipation  Trazodone  50 mg at bedtime PRN for sleep    2. 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 Medical Issues Being Addressed:  None - Would benefit from establishing with a PCP  4. Discharge Planning:  Date: Monday, 05/18/2024  Barrier: Psychosis Location: Homeless Shelter   Treatment Plan Summary: I certify that inpatient services furnished can reasonably be expected to improve the patient's condition.   The risks/benefits/side-effects/alternatives to this medication were discussed in detail with the patient and time was given for questions. The patient consents to medication trial. The patient consents to medication trial. FDA black box warnings, if present, were discussed. Metabolic profile and EKG monitoring obtained while on an atypical antipsychotic  Encouraged patient to participate in unit milieu and in scheduled group therapies  Short Term Goals: Ability to identify changes in lifestyle to reduce recurrence of condition will improve, Ability to verbalize feelings will improve, Ability to disclose and discuss suicidal ideas, Ability to demonstrate self-control will improve, Ability to identify and develop effective coping behaviors will improve, Ability to maintain clinical measurements within normal limits will improve, Compliance with prescribed medications will improve,  and Ability to identify triggers associated with substance abuse/mental health issues will improve Long Term Goals: Improvement in symptoms so as ready for discharge Social work and case management to assist with discharge planning and identification of hospital follow-up needs prior to  discharge Estimated LOS: 5-7 days Discharge Concerns: Need to establish a safety plan; Medication compliance and effectiveness Discharge Goals: Return home with outpatient referrals for mental health follow-up including medication management/psychotherapy    Leita Arts, MD 05/16/24

## 2024-05-16 NOTE — Group Note (Signed)
 Date:  05/16/2024 Time:  5:14 PM  Group used a non- competitive card game to build mindful listening  skills and encourage acceptance and understanding of oneself and peers. Patients cooperate and share, fostering empathy and personal growth in a supportive environment    Participation Level:  Active  Participation Quality:  Attentive, Sharing, and Supportive  Affect:  Appropriate  Cognitive:  Alert  Insight: Improving  Engagement in Group:  Improving and Supportive  Modes of Intervention:  Activity, Discussion, Exploration, Rapport Building, Socialization, and Support  Additional Comments:    Berwyn GORMAN Acosta 05/16/2024, 5:14 PM

## 2024-05-17 NOTE — Group Note (Deleted)
 Date:  05/17/2024 Time:  5:45 PM  Group Topic/Focus:  Emotional Education:   The focus of this group is to discuss what feelings/emotions are, and how they are experienced.     Participation Level:  {BHH PARTICIPATION OZCZO:77735}  Participation Quality:  {BHH PARTICIPATION QUALITY:22265}  Affect:  {BHH AFFECT:22266}  Cognitive:  {BHH COGNITIVE:22267}  Insight: {BHH Insight2:20797}  Engagement in Group:  {BHH ENGAGEMENT IN HMNLE:77731}  Modes of Intervention:  {BHH MODES OF INTERVENTION:22269}  Additional Comments:  ***  Reginold Beale 05/17/2024, 5:45 PM

## 2024-05-17 NOTE — Group Note (Signed)
 Date:  05/17/2024 Time:  10:06 AM  Group Topic/Focus:  Goals Group:   The focus of this group is to help patients establish daily goals to achieve during treatment and discuss how the patient can incorporate goal setting into their daily lives to aide in recovery.    Participation Level:  Active  Participation Quality:  Appropriate  Affect:  Appropriate  Cognitive:  Appropriate  Insight: Appropriate  Engagement in Group:  Engaged  Modes of Intervention:  Discussion  Additional Comments:  pt spoke about helpg other especially his parents  Nat Rummer 05/17/2024, 10:06 AM

## 2024-05-17 NOTE — Progress Notes (Signed)
 [facility] MD Progress Note  Date: 05/17/2024 1:44 PM Name: Richard Mclean.  DOB: 1976-03-08 MRN: [id number] Unit: [id number]/[id number]  Reason for admission: Suicidal ideation and bizarre behavior  Identifying Information: Richard Mclean is a 48 y.o. male  with a documented PMH of  MDD with psychosis, EtOH abuse, and recent discharge from [facility] on 05/07/2024, who was transferred from [facility]  to [facility] (05/11/2024) for voluntary admission for suicidal ideation and bizarre behavior.   DAY: 6  Principal Problem: Schizophrenia, unspecified (HCC) Diagnosis: Principal Problem:   Schizophrenia, unspecified (HCC) Active Problems:   MDD (major depressive disorder)  Interval Events: Per chart review the patient has been attending all groups with appropriate engagement. Has been medication compliant. Did take hydroxyzine  twice yesterday. No behavioral concerns reported and denying SI, HI and AVH.   Today on interview The patient reports he is pretty good. He was meditating/dozing this afternoon, but felt he slept okay last evening. No specific concerns today. He is fine with leaving the hospital tomorrow and feels comfortable going to the [facility] if no shelter can be found. He does still have some things at his mother's house, but he would prefer not to go back there. Regardless, he believes her cell phone is off due to not paying the bill so is unlikely to be able to be contacted. He is overall feeling positive in mood, has good appetite, has no SI, HI and AVH.    Review of Systems  Gastrointestinal:  Negative for abdominal pain, diarrhea, nausea and vomiting.  Genitourinary:  Negative for dysuria, frequency and urgency.  Musculoskeletal:  Negative for myalgias.  Neurological:  Negative for dizziness.   No physical complaints at this time.  HISTORY  Past Medical History:  Past Medical History:  Diagnosis Date   Depression     History reviewed. No pertinent surgical history. Family History:  Family  History  Problem Relation Age of Onset   Alcohol abuse Mother    Depression Mother    Mental illness Mother    Mental illness Father    Mental illness Sister    HIV/AIDS Brother    Mental illness Maternal Grandmother    Social History:  Social History   Substance and Sexual Activity  Alcohol Use Yes   Comment: Malt liquor or liquor every other weekend     Social History   Substance and Sexual Activity  Drug Use Not Currently   Types: Marijuana    Social History   Socioeconomic History   Marital status: Single    Spouse name: Not on file   Number of children: 0   Years of education: GED   Highest education level: GED or equivalent  Occupational History   Occupation: Unemployed  Tobacco Use   Smoking status: Every Day    Types: Cigarettes   Smokeless tobacco: Never  Vaping Use   Vaping status: Every Day  Substance and Sexual Activity   Alcohol use: Yes    Comment: Malt liquor or liquor every other weekend   Drug use: Not Currently    Types: Marijuana   Sexual activity: Yes  Other Topics Concern   Not on file  Social History Narrative   ** Merged History Encounter **       Completed social determinants screening and administered PHQ 9 along with Biopsychosocial assessment.    Social Drivers of Health   Financial Resource Strain: High Risk (05/04/2019)   Overall Financial Resource Strain (CARDIA)    Difficulty of Paying  Living Expenses: Hard  Food Insecurity: Food Insecurity Present (05/11/2024)   Hunger Vital Sign    Worried About Running Out of Food in the Last Year: Sometimes true    Ran Out of Food in the Last Year: Sometimes true  Transportation Needs: No Transportation Needs (05/11/2024)   PRAPARE - Administrator, Civil Service (Medical): No    Lack of Transportation (Non-Medical): No  Physical Activity: Sufficiently Active (05/04/2019)   Exercise Vital Sign    Days of Exercise per Week: 7 days    Minutes of Exercise per Session: 30 min   Stress: Stress Concern Present (05/04/2019)   [employer] of Occupational Health - Occupational Stress Questionnaire    Feeling of Stress : Very much  Social Connections: Socially Isolated (05/04/2019)   Social Connection and Isolation Panel    Frequency of Communication with Friends and Family: Once a week    Frequency of Social Gatherings with Friends and Family: Never    Attends Religious Services: Never    Database administrator or Organizations: No    Attends Banker Meetings: Never    Marital Status: Never married   Additional Social History:  Patient is unhoused in [place]. Per mom's report patient has not been able to consistently hold a job for several years. Per patient and mom's report patient struggled in school growing up and left school at some point in highschool. Mom thinks patient may have completed his GED while incarcerated.    Current Medications: Current Facility-Administered Medications  Medication Dose Route Frequency Provider Last Rate Last Admin   acetaminophen  (TYLENOL ) tablet 650 mg  650 mg Oral Q6H PRN Motley-Mangrum, Jadeka A, PMHNP       alum & mag hydroxide-simeth (MAALOX/MYLANTA) 200-200-20 MG/5ML suspension 30 mL  30 mL Oral Q4H PRN Motley-Mangrum, Jadeka A, PMHNP       ARIPiprazole  (ABILIFY ) tablet 10 mg  10 mg Oral Daily Woodie Trusty N, MD   10 mg at 05/17/24 9195   haloperidol  (HALDOL ) tablet 5 mg  5 mg Oral TID PRN Motley-Mangrum, Jadeka A, PMHNP       And   diphenhydrAMINE  (BENADRYL ) capsule 50 mg  50 mg Oral TID PRN Motley-Mangrum, Jadeka A, PMHNP       haloperidol  lactate (HALDOL ) injection 5 mg  5 mg Intramuscular TID PRN Motley-Mangrum, Jadeka A, PMHNP       And   diphenhydrAMINE  (BENADRYL ) injection 50 mg  50 mg Intramuscular TID PRN Motley-Mangrum, Jadeka A, PMHNP       And   LORazepam  (ATIVAN ) injection 2 mg  2 mg Intramuscular TID PRN Motley-Mangrum, Jadeka A, PMHNP       haloperidol  lactate (HALDOL ) injection 10 mg   10 mg Intramuscular TID PRN Motley-Mangrum, Jadeka A, PMHNP       And   diphenhydrAMINE  (BENADRYL ) injection 50 mg  50 mg Intramuscular TID PRN Motley-Mangrum, Jadeka A, PMHNP       And   LORazepam  (ATIVAN ) injection 2 mg  2 mg Intramuscular TID PRN Motley-Mangrum, Jadeka A, PMHNP       FLUoxetine  (PROZAC ) capsule 20 mg  20 mg Oral Daily Motley-Mangrum, Jadeka A, PMHNP   20 mg at 05/17/24 0804   hydrOXYzine  (ATARAX ) tablet 25 mg  25 mg Oral TID PRN Motley-Mangrum, Jadeka A, PMHNP   25 mg at 05/16/24 2114   magnesium  hydroxide (MILK OF MAGNESIA) suspension 30 mL  30 mL Oral Daily PRN Motley-Mangrum, Jadeka A, PMHNP  nicotine  polacrilex (NICORETTE ) gum 2 mg  2 mg Oral PRN Herold Salguero N, MD   2 mg at 05/17/24 0804   traZODone  (DESYREL ) tablet 50 mg  50 mg Oral QHS PRN Motley-Mangrum, Jadeka A, PMHNP   50 mg at 05/15/24 2136    OBJECTIVE  BP (!) 135/90 (BP Location: Right Arm)   Pulse 76   Temp 98.5 F (36.9 C) (Oral)   Resp 12   Ht 5' 9 (1.753 m)   Wt 87.8 kg   SpO2 100%   BMI 28.59 kg/m    Physical Exam Vitals and nursing note reviewed.  Constitutional:      General: He is not in acute distress. HENT:     Head: Normocephalic and atraumatic.  Eyes:     Extraocular Movements: Extraocular movements intact.  Pulmonary:     Effort: Pulmonary effort is normal.  Musculoskeletal:        General: Normal range of motion.     Cervical back: Normal range of motion.  Neurological:     General: No focal deficit present.     Mental Status: He is alert.     Gait: Gait normal.     Mental Status exam: Appearance: white male of slightly elevated BMI, shaved head, seen appropriately groomed in shorts and t-shirt, seen laying in bed but does sit up for interview  Eye contact: good  Attitude towards examiner polite, engaged  Psychomotor: no agitation or retardation  Speech: rambling, but normal in rate and prosody Language: no delays  Mood: good Affect: congruent, euthymic,  appropriate  Thought content: denying SI and HI, no overt delusional content expressed today, less odd comments  Thought Process: largely linear, continues to jump to various topics  Perception: denying AVH, not RTIS  Insight: fair  Judgement: fair   Orientation: x3 Attention/Concentration: fair to good - less distractible today.  Memory/Cognition: memory grossly intact for recent and remote data Fund of Knowledge: below average based on historical data as well as conversation   Gait: steady MSK: normal tone   Lab Results:  No results found for this or any previous visit (from the past 48 hours).  Blood Alcohol level:  Lab Results  Component Value Date   [facility] <15 05/11/2024   ETH <15 05/02/2024    Metabolic Disorder Labs: Lab Results  Component Value Date   HGBA1C 5.4 05/04/2024   MPG 108 05/04/2024   No results found for: PROLACTIN Lab Results  Component Value Date   CHOL 182 05/04/2024   TRIG 84 05/04/2024   HDL 52 05/04/2024   CHOLHDL 3.5 05/04/2024   VLDL 17 05/04/2024   LDLCALC 113 (H) 05/04/2024   LDLCALC 135 (H) 05/02/2024   ASSESSMENT / PLAN  Diagnoses / Active Problems: Principal Problem:   Schizophrenia, unspecified (HCC) Active Problems:   MDD (major depressive disorder)  Assessment: Sherida [name]. Is a 48 year old man with history of MDD with psychotic features and recent psychiatric hospitalization earlier this month who is admitted for suicidal ideation and bizarre behavior. Unspecified schizophrenia spectrum and other psychotic disorder disorder is the leading differential  given patient answers are disorganized, reports of visual hallucinations and paranoid ideation at time of admission. Increasing suspicion that visual hallucinations and delusions may instead be odd interpretation/communication of real world events and stimuli due to possible IDD or schizotypal personality disorder. 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 Suspicion for ADHD decreased at this time given patient has had improved concentration and less fidgeting during interviews for the past 2 days. He continues to tolerate Abilify  well. Reports increased liquid intake, but is having a reasonable amount of reported urinary output, no dysruia and no nocturnal symptoms. Will continue to monitor. Abilify  was increased 10 mg on 8/22 with goal dose of  10 or 15 mg depending on symptom response and tolerability.   As of 8/23 the patient remained with odd remarks and tangentiality, however was not seen RTIS, continued to deny AVH and was largely adherent to interview. Odd comments and mannerisms are increasingly suspected to be due to underlying personality traits (schizotypal suspected) plus IDD. Today the patient was less distractible and more engaged in interview. He demonstrated appropriate eye contact and mannerisms and did not endorse AVH. He has denied SI since day of admission. Patient is considered at baseline. Plan is to discharge tomorrow either to shelter or [facility].     PLAN: Safety and Monitoring:             --  Voluntary admission to inpatient psychiatric unit for safety, stabilization and treatment             -- Daily contact with patient to assess and evaluate symptoms and progress in treatment             -- Patient's case to be discussed in multi-disciplinary team meeting             -- Observation Level : q15 minute checks             -- Vital signs:  q12 hours             -- Precautions: suicide, elopement, and assault PRNs Tylenol  Q6H PRN for mild pain Maalox/Mylanta Q4H PRN for indigestion  Haldol  5 mg and Benadryl  50 mg oral Q8H PRN for mild agitation  Haldol  10 mg, Benadryl  50 mg and Ativan  2 mg IM Q8H PRN for severe  agitation Hydroxyzine  25 mg oral Q6H PRN for anxiety/agitation  Milk of magnesia daily PRN for constipation  Trazodone  50 mg at bedtime PRN for sleep    2. 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 Medical Issues Being Addressed:  None - Would benefit from establishing with a PCP  4. Discharge Planning:  Date: Monday, 05/18/2024  Barrier: Psychosis Location: Homeless Shelter   Treatment Plan Summary: I certify that inpatient services furnished can reasonably be expected to improve the patient's condition.   The risks/benefits/side-effects/alternatives to this medication were discussed in detail with the patient and time was given for questions. The patient consents to medication trial. The patient consents to medication trial. FDA black box warnings, if present, were discussed. Metabolic profile and EKG monitoring obtained while on an atypical antipsychotic  Encouraged patient to participate in unit milieu and in scheduled group therapies  Short Term Goals: Ability to identify changes in lifestyle to reduce recurrence of condition will improve, Ability to verbalize feelings will improve, Ability to disclose and discuss suicidal ideas, Ability to demonstrate self-control will improve, Ability to identify and develop effective coping behaviors will improve, Ability to maintain clinical measurements within  normal limits will improve, Compliance with prescribed medications will improve, and Ability to identify triggers associated with substance abuse/mental health issues will improve Long Term Goals: Improvement in symptoms so as ready for discharge Social work and case management to assist with discharge planning and identification of  hospital follow-up needs prior to discharge Estimated LOS: 5-7 days Discharge Concerns: Need to establish a safety plan; Medication compliance and effectiveness Discharge Goals: Return home with outpatient referrals for mental health follow-up including medication management/psychotherapy    Leita Arts, MD 05/16/24

## 2024-05-17 NOTE — Plan of Care (Signed)
 Nurse discussed anxiety, depression and coping skills with patient.

## 2024-05-17 NOTE — Group Note (Signed)
 Date:  05/17/2024 Time:  5:37 PM  Group Topic/Focus:  Emotional Education:   The focus of this group is to discuss what feelings/emotions are, and how they are experienced.    Participation Level:  Active  Participation Quality:  Appropriate and Attentive  Affect:  Appropriate  Cognitive:  Appropriate  Insight: Good  Engagement in Group:  Engaged  Modes of Intervention:  Discussion and Education   Richard Mclean 05/17/2024, 5:37 PM

## 2024-05-17 NOTE — Plan of Care (Addendum)
 (  Sleep Hours) - 6.5 (Any PRNs that were needed, meds refused, or side effects to meds)- Hydroxyzine , Nicorette  (Any disturbances and when (visitation, over night)- none (Concerns raised by the patient)- Anxiety (SI/HI/AVH)- Denies

## 2024-05-17 NOTE — Group Note (Signed)
 Date:  05/17/2024 Time:  10:27 AM  Group Topic/Focus:  Developing a Wellness Toolbox:   The focus of this group is to help patients develop a wellness toolbox with skills and strategies to promote recovery upon discharge.    Participation Level:  Active  Participation Quality:  Appropriate  Affect:  Appropriate  Cognitive:  Appropriate  Insight: Appropriate  Engagement in Group:  Engaged  Modes of Intervention:  Discussion  Additional Comments:  open discussion on feelings  Nat Rummer 05/17/2024, 10:27 AM

## 2024-05-17 NOTE — Group Note (Signed)
 Date:  05/17/2024 Time:  8:24 PM  Group Topic/Focus:  Goals Group:   The focus of this group is to help patients establish daily goals to achieve during treatment and discuss how the patient can incorporate goal setting into their daily lives to aide in recovery. Wrap-Up Group:   The focus of this group is to help patients review their daily goal of treatment and discuss progress on daily workbooks.    Participation Level:  Active  Participation Quality:  Appropriate  Affect:  Appropriate  Cognitive:  Appropriate  Insight: Appropriate  Engagement in Group:  Engaged  Modes of Intervention:  Discussion  Additional Comments:  Pt wants to follow the rules of program  Richard Mclean 05/17/2024, 8:24 PM

## 2024-05-18 MED ORDER — FLUOXETINE HCL 20 MG PO CAPS: 20.0000 mg | ORAL_CAPSULE | Freq: Every day | ORAL | 0 refills | Status: DC

## 2024-05-18 MED ORDER — ARIPIPRAZOLE 10 MG PO TABS: 10.0000 mg | ORAL_TABLET | Freq: Every day | ORAL | 0 refills | Status: DC

## 2024-05-18 NOTE — Progress Notes (Signed)
 Richard Mclean.  D/C'd Home per MD order.  Discussed with the patient and all questions fully answered.  An After Visit Summary was printed and given to the patient. Patient received prescription.  D/c education completed with patient including follow up instructions, medication list, d/c activities limitations if indicated, with other d/c instructions as indicated by MD - patient able to verbalize understanding, all questions fully answered. Patient senies SI/HI/AVH at discharge, and he was very appreciative of his care at the [facility].   Patient instructed to return to ED, call 911, or call MD for any changes in condition.   Patient escorted to the lobby, and D/C via public transport at 1120.  Richard Mclean Doing 05/18/2024 1:17 PM

## 2024-05-18 NOTE — Plan of Care (Signed)
  Problem: Activity: Goal: Interest or engagement in activities will improve Outcome: Progressing   Problem: Coping: Goal: Ability to verbalize frustrations and anger appropriately will improve Outcome: Progressing   Problem: Physical Regulation: Goal: Ability to maintain clinical measurements within normal limits will improve Outcome: Progressing

## 2024-05-18 NOTE — Progress Notes (Signed)
 Shift Note  (Sleep Hours) - 8.25  (Any PRNs that were needed, meds refused, or side effects to meds)-  PO PRN Trazodone  and Hydroxyzine  Given  (Any disturbances and when (visitation, over night)- None  (Concerns raised by the patient)- None  (SI/HI/AVH)- Denies

## 2024-05-18 NOTE — [facility] Suicide Risk Assessment (Signed)
 [facility] Discharge Suicide and Homicide Risk Assessment   Principal Problem: Schizophrenia, unspecified (HCC) Discharge Diagnoses: Principal Problem:   Schizophrenia, unspecified (HCC) Active Problems:   MDD (major depressive disorder)   Suicide Risk: The patient presented with acute risk factors of presenting with reported suicidal thoughts and endorsed plan to jump off a bridge. To mitigate this risk, he was voluntarily admitted to inpatient psychiatry for further evaluation and treatment with medications restarted. Additional risk factors include male gender, history of depression, history of inpatient hospitalizations, history of suicidal thoughts, unstable housing, lack of resources, and borderline intellectual functioning. These risk factyors are mitigated by several protective factors, including the denial of SI upon arrival. The day following admission he presented as bright and engaged, smiling appropriately, denying SI and endorsing good benefit of his medications. He additionally was forthcoming regarding utilizing the hospital for transportation, which demonstrates future orientation and is inconsistent with a desire to die. Today the patient continued to present as euthymic and engaged, denying SI (for approximately 6-7 days), and expressing intent to continue on medications and follow outpatient plan. Additional protective factors include history of help-seeking behavior, good working knowledge of community resources and history of using them, no known prior suicide attempts, family support (siblings, mom) and engaged in Aeronautical engineer. At this time, short-term risk of suicide is considered low.   Homicide Risk: The patient presented with acute risk of active psychotic symptoms - hallucinations and disorganized behavior. He carries additional chronic risk factors, including history of alcohol use, history of aggressive and violent behavior during intoxication, history of violence towards  family in the past (at least in childhood). These risk factors are mitigated by current sobriety, no aggressive/agitated behavior since arrival - no PRN medications required throughout admission. He has consistently denied thoughts of harming others, denies access to weapons, has no intent, plan, or clear target. By end of hospital stay he did not overtly present with psychotic symptoms and although some odd comments and mannerisms remained these are in line with patient's personality and he is deemed at baseline. Although he is at chronically elevated risk of harm to others in comparison to the general population, short term risk of homicide is deemed low   Overall risk: The patient is currently at low risk of harm to self and low risk of harm to others. During his hospital stay medications were adjusted, he engaged in safety planning and is discharging with improvements in mood, affect, sleep, appetite and free from overt psychotic symptoms. At this time the most appropriate and least restrictive environment is outpatient follow up as documented below.     Total Time spent with patient: 35 minutes   Follow-up Information     [place] Go on 05/20/2024.   Specialty: Professional Counselor Why: Please go to this provider on 05/20/24 at 9:00 am for an assessment, to obtain therapy services.  You may also go Monday through Friday, from 9 am to 1 pm. Contact information: [address]  [address] [place] [address] [phone]         [facility]. Go on 05/21/2024.   Specialty: Behavioral Health Why: Please go to this provider on 05/21/24 at 7:00 am for an assessment, to obtain medication management services. You may also go Monday through Friday, arrive by 7:00 am for an assessment. Contact information: [address] [place]  [address] [phone]                Leita  LOISE Arts, MD 05/18/2024, 7:55 AM

## 2024-05-18 NOTE — Group Note (Signed)
 Date:  05/18/2024 Time:  4:02 PM  Group Topic/Focus:  Goals Group:   The focus of this group is to help patients establish daily goals to achieve during treatment and discuss how the patient can incorporate goal setting into their daily lives to aide in recovery. Orientation:   The focus of this group is to educate the patient on the purpose and policies of crisis stabilization and provide a format to answer questions about their admission.  The group details unit policies and expectations of patients while admitted.    Participation Level:  Did Not Attend   [name] 05/18/2024, 4:02 PM

## 2024-05-18 NOTE — Transportation (Signed)
 05/18/2024  Richard Mclean. DOB: 1975/12/27 MRN: [id number]   RIDER WAIVER AND RELEASE OF LIABILITY  For the purposes of helping with transportation needs, [facility] partners with outside transportation providers (taxi companies, [employer], Catering manager.) to give [facility] patients or other approved people the choice of on-demand rides Public librarian) to our buildings for non-emergency visits.  By using [employer], I, the person signing this document, on behalf of myself and/or any legal minors (in my care using the [employer]), agree:  Science writer given to me are supplied by independent, outside transportation providers who do not work for, or have any affiliation with, [employer]. [facility] is not a transportation company. [facility] has no control over the quality or safety of the rides I get using [employer]. [facility] has no control over whether any outside ride will happen on time or not. [facility] gives no guarantee on the reliability, quality, safety, or availability on any rides, or that no mistakes will happen. I know and accept that traveling by vehicle (car, truck, SVU, fleeta, bus, taxi, etc.) has risks of serious injuries such as disability, being paralyzed, and death. I know and agree the risk of using [employer] is mine alone, and not [employer]. [employer] are provided as is and as are available. The transportation providers are in charge for all inspections and care of the vehicles used to provide these rides. I agree not to take legal action against [facility], its agents, employees, officers, directors, representatives, insurers, attorneys, assigns, successors, subsidiaries, and affiliates at any time for any reasons related directly or indirectly to using [employer]. I also agree not to take legal action against [facility] or its affiliates for any injury, death, or damage to property caused by or related to  using [employer]. I have read this Waiver and Release of Liability, and I understand the terms used in it and their legal meaning. This Waiver is freely and voluntarily given with the understanding that my right (or any legal minors) to legal action against [facility] relating to [employer] is knowingly given up to use these services.   I attest that I read the Ride Waiver and Release of Liability to [name], gave [name] the opportunity to ask questions and answered the questions asked (if any). I affirm that [name] then provided consent for assistance with transportation.

## 2024-05-18 NOTE — Group Note (Signed)
 Recreation Therapy Group Note   Group Topic:Team Building  Group Date: 05/18/2024 Start Time: 0930 End Time: 0958 Facilitators: Tranquilino Fischler-McCall, LRT,CTRS Location: [address] Dayroom   Group Topic: Communication, Team Building, Problem Solving  Goal Area(s) Addresses:  Patient will effectively work with peer towards shared goal.  Patient will identify skills used to make activity successful.  Patient will identify how skills used during activity can be applied to reach post d/c goals.   Behavioral Response: Engaged  Intervention: STEM Activity- Glass blower/designer  Activity: Tallest [employer]. In teams of 5-6, patients were given 11 craft pipe cleaners. Using the materials provided, patients were instructed to compete again the opposing team(s) to build the tallest free-standing structure from floor level. The activity was timed; difficulty increased by Clinical research associate as Production designer, theatre/television/film continued.  Systematically resources were removed with additional directions for example, placing one arm behind their back, working in silence, and shape stipulations. LRT facilitated post-activity discussion reviewing team processes and necessary communication skills involved in completion. Patients were encouraged to reflect how the skills utilized, or not utilized, in this activity can be incorporated to positively impact support systems post discharge.  Education: Pharmacist, community, Scientist, physiological, Discharge Planning   Education Outcome: Acknowledges education/In group clarification offered/Needs additional education.    Affect/Mood: Appropriate   Participation Level: Engaged   Participation Quality: Independent   Behavior: Appropriate   Speech/Thought Process: Focused   Insight: Good   Judgement: Good   Modes of Intervention: STEM Activity   Patient Response to Interventions:  Engaged   Education Outcome:  In group clarification offered    Clinical  Observations/Individualized Feedback: Pt was engaged and worked well with peers in Administrator and completing their tower.     Plan: Continue to engage patient in RT group sessions 2-3x/week.   Richard Mclean, LRT,CTRS  05/18/2024 12:53 PM

## 2024-05-18 NOTE — Group Note (Signed)
 Date:  05/18/2024 Time:  4:36 PM  Group Topic/Focus:  Emotional and Physical Wellness Education:   The purpose of this group is to identify and challenge unhealthy thought patterns key to emotional wellness. The connection between emotional and physical wellness was also discussed.     Participation Level:  Active  Participation Quality:  Appropriate  Affect:  Appropriate  Cognitive:  Appropriate  Insight: Appropriate  Engagement in Group:  Engaged  Modes of Intervention:  Discussion and Education  Additional Comments:    Richard Mclean Mars 05/18/2024, 4:36 PM

## 2024-05-18 NOTE — Progress Notes (Signed)
  [facility] Adult Case Management Discharge Plan :  Will you be returning to the same living situation after discharge:  No. At discharge, do you have transportation home?: Yes,  CSW arranged [employer] taxi for 1 pm to the [facility] Do you have the ability to pay for your medications: No. Prescription requested from pharmacy for samples due to dc to shelter  Release of information consent forms completed and in the chart;  Patient's signature needed at discharge.  Patient to Follow up at:  Follow-up Information     [facility] Go on 05/20/2024.   Specialty: Professional Counselor Why: Please go to this provider on 05/20/24 at 9:00 am for an assessment, to obtain therapy services.  You may also go Monday through Friday, from 9 am to 1 pm. Contact information: [address]  [address] [place] [address] [phone]         [facility]. Go on 05/21/2024.   Specialty: Behavioral Health Why: Please go to this provider on 05/21/24 at 7:00 am for an assessment, to obtain medication management services. You may also go Monday through Friday, arrive by 7:00 am for an assessment. Contact information: [address] [place]  [phone]                Next level of care provider has access to [facility] Link:no  Safety Planning and Suicide Prevention discussed: Yes,  completed with patient at discharge, declined other consents     Has patient been referred to the Quitline?: Patient refused referral for treatment  Patient has been referred for addiction treatment: No known substance use disorder.  Jenkins LULLA Primer, LCSWA 05/18/2024, 9:31 AM

## 2024-05-18 NOTE — Discharge Summary (Signed)
 Physician Discharge Summary Note  Patient:  Richard Mclean. is an 48 y.o., male MRN:  [id number] DOB:  07-09-1976 Patient phone:  [phone] (home)  Patient address:   [place] [address],  Total Time spent with patient: 35 minutes   Date of Admission:  05/11/2024 Date of Discharge: 05/18/24  Reason for Admission:  SI, concern for psychosis   Principal Problem: Schizophrenia, unspecified (HCC) Discharge Diagnoses: Principal Problem:   Schizophrenia, unspecified (HCC) Active Problems:   MDD (major depressive disorder)   Identifying Information and Past Psychiatric History:  The patient is a 48 y.o. male (unemployed, unstably domiciled) with no significant medical history and a psychaitric history most consistent with unspecified schizophrenia. The patient was admitted to [facility] after self-presenting with endorsed SI and additoinal concern for psychosis.   The patient's psychiatric history is notable for a number of previous diagnoses including MDD with psychosis, SAD and and bipolar diagnoses. Per patient and chart review there is no credible evidence of a prior manic episode, although he does frequently present with psychotic symptoms including tangential thought process, bizarre statements (bordering on delusional), odd mannerisms and concern for hallucinations. On this admission, despite these observable signs of psychosis he otherwise was largely able to engage in questions, was fully oriented and responded appropriately to most questions. Differential has been broad. An underlying schizophrenia or schizoaffective disorder could be the cause. Additionally, per patient and collateral, he does have a history of poor school performance (failed several grades, did not get past grade 9 and required special education) and behavioral concerns in childhood that indicates some component of intellectual disability. Finally, per family the patient has always displayed odd behaviors and  made bizarre comments from a young age, which is overall inconsistent with a primary psychotic disorder. 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 He has had numerous inpatient admissions for depression, SI and mild psychotic symptoms. Most recently at [facility], discharged 8/14 with a regimen of prozac  + olanzapine .   Hospital Course: On this admission the patient self-presented with suicidal ideation to [facility] ED and was subsequently transferred [facility]untary to [facility] (05/11/2024) for suicidal ideation and visualized bizarre behavior concerning for psychosis. Labs non-contributory at that time. He had notably been discharged from [facility] just 4 days prior on 8/14, stabilized on a combination of prozac  20 mg daily and olanzapine  10 mg daily.   Upon arrival, the patient presented with a tangential thought process - jumping to bizarre topics seemingly at random, bordering on loose associations. He at times displayed bizarre mannerisms, such as sitting up and down, taking shoes on and off during interview. He discussed odd topics potentially concerning for delusional content (ninja god, the purge, etc). Despite these observable signs of psychosis he otherwise was largely able to engage in questions, was fully oriented and responded appropriately to most questions. Although the cause of psychotic symptoms was unclear, plan was to treat with an atypical antipsychotic. Due to concern for metabolic side effects, olanzapine  was tapered and discontinued.  Instead he was initiated on abilify , titrated to a total dose of 10 mg daily. This medication was chosen to address any underlying emotional lability/aggression (as is reported when the patient is intoxicated). To address history of depressive symptoms prozac  20 mg daily was continued unchanged.  On initial evaluation  the patient presented as euthymic in affect, endorsing good mood and denial of  SI. He was open about utilizing the hospital to assist in getting transportation and reported SI to be conditional on these psychosocial stressors.   Overall: During the patient's time in the hospital he did not present with any subjective or objective signs of active depression. SI cleared immediately upon arrival and was deemed conditional to psychosocial concerns - specifically housing and transportation. He remained bright and euthymic, engaged in groups throughout admission. Although he did present with tangentiality, odd mannerisms and odd statements throughout admission he consistently was able to engage in interview linearly. He did not require any PRNs for agitation and did not present as overtly psychotic. With addition of abilify  there were some improvements in patient's ability to engage in interview without being distracted and slight improvements in odd mannerisms. Underlying personality traits of magical thinking and whimsical pattern of expressing himself remained throughout hospitalization and are unlikely to change with medications. By 8/24 he was deemed at baseline and plan was to discharge hom the following day  Interview Today Today the patient reports he is good. He does not have any ongoing suicidal thoughts, no HI and no hallucinations. He reports good mood, good appetite and good sleep. He states he is happy to have receieved a paper cap made by another patient to protec his head from the sun as he is discharging today. He is happy with discharge plan, reports he feels comfortable as he is very familiar with [facility] and knows the resources well. He denies side effects to medications, voices good benefit and has no other concerns today. States he will be back if he is ever needed.    Past Medical History:  Past Medical History:  Diagnosis Date   Depression    History reviewed. No pertinent  surgical history. Family History:  Family History  Problem Relation Age of Onset   Alcohol abuse Mother    Depression Mother    Mental illness Mother    Mental illness Father    Mental illness Sister    HIV/AIDS Brother    Mental illness Maternal Grandmother     Social History:  Social History   Substance and Sexual Activity  Alcohol Use Yes   Comment: Malt liquor or liquor every other weekend     Social History   Substance and Sexual Activity  Drug Use Not Currently   Types: Marijuana    Social History   Socioeconomic History   Marital status: Single    Spouse name: Not on file   Number of children: 0   Years of education: GED   Highest education level: GED or equivalent  Occupational History   Occupation: Unemployed  Tobacco Use   Smoking status: Every Day    Types: Cigarettes   Smokeless tobacco: Never  Vaping Use   Vaping status: Every Day  Substance and Sexual Activity   Alcohol use: Yes    Comment: Malt liquor or liquor every other weekend   Drug use: Not Currently    Types: Marijuana   Sexual activity: Yes  Other Topics Concern   Not on file  Social History Narrative   ** Merged History Encounter **       Completed social determinants screening and administered PHQ 9 along with Biopsychosocial assessment.    Social Drivers of Health   Financial Resource Strain: High Risk (05/04/2019)   Overall Financial Resource Strain (CARDIA)    Difficulty of Paying Living Expenses: Hard  Food Insecurity: Food Insecurity Present (05/11/2024)   Hunger Vital Sign  Worried About Programme researcher, broadcasting/film/video in the Last Year: Sometimes true    [employer] of Food in the Last Year: Sometimes true  Transportation Needs: No Transportation Needs (05/11/2024)   PRAPARE - Administrator, Civil Service (Medical): No    Lack of Transportation (Non-Medical): No  Physical Activity: Sufficiently Active (05/04/2019)   Exercise Vital Sign    Days of Exercise per Week: 7 days     Minutes of Exercise per Session: 30 min  Stress: Stress Concern Present (05/04/2019)   [employer] of Occupational Health - Occupational Stress Questionnaire    Feeling of Stress : Very much  Social Connections: Socially Isolated (05/04/2019)   Social Connection and Isolation Panel    Frequency of Communication with Friends and Family: Once a week    Frequency of Social Gatherings with Friends and Family: Never    Attends Religious Services: Never    Database administrator or Organizations: No    Attends Engineer, structural: Never    Marital Status: Never married   Objective:  Discharge Mental Status exam: Appearance: white male of slightly elevated BMI, seen appropriately groomed in shorts and t-shirt, paper cap with rim on head, seen walking calmly through the milieu  Eye contact: good  Attitude towards examiner polite, engaged  Psychomotor: no agitation or retardation  Speech: rambling, but normal in rate and prosody Language: no delays  Mood: good Affect: congruent, euthymic, appropriate  Thought content: denying SI and HI, no overt delusional content expressed today, less odd comments but these are still there Thought Process: largely linear, continues to jump to various topics but better ability to adhere to interview   Perception: denying AVH, not RTIS  Insight: fair to good  Judgement: fair    Orientation: x3 Attention/Concentration: fair to good - less distractible overall.  Memory/Cognition: memory grossly intact for recent and remote data Fund of Knowledge: below average based on historical data as well as conversation    Gait: steady MSK: normal tone     Physical Exam Constitutional:      General: He is not in acute distress.    Appearance: Normal appearance.  HENT:     Head: Normocephalic and atraumatic.  Pulmonary:     Effort: Pulmonary effort is normal.  Abdominal:     General: There is no distension.  Musculoskeletal:         General: Normal range of motion.  Neurological:     General: No focal deficit present.     Mental Status: He is alert.    ROS Blood pressure 133/84, pulse 80, temperature 98.6 F (37 C), temperature source Oral, resp. rate 15, height 5' 9 (1.753 m), weight 87.8 kg, SpO2 99%. Body mass index is 28.59 kg/m.   Social History   Tobacco Use  Smoking Status Every Day   Types: Cigarettes  Smokeless Tobacco Never   Tobacco Cessation:  A prescription for an FDA-approved tobacco cessation medication was offered at discharge and the patient refused   Blood Alcohol level:  Lab Results  Component Value Date   [facility] <15 05/11/2024   ETH <15 05/02/2024    Metabolic Disorder Labs:  Lab Results  Component Value Date   HGBA1C 5.4 05/04/2024   MPG 108 05/04/2024   No results found for: PROLACTIN Lab Results  Component Value Date   CHOL 182 05/04/2024   TRIG 84 05/04/2024   HDL 52 05/04/2024   CHOLHDL 3.5 05/04/2024   VLDL 17  05/04/2024   LDLCALC 113 (H) 05/04/2024   LDLCALC 135 (H) 05/02/2024    See Psychiatric Specialty Exam and Suicide Risk Assessment completed by Attending Physician prior to discharge.  Discharge destination:  [facility]    Allergies as of 05/18/2024   No Known Allergies      Medication List     STOP taking these medications    OLANZapine  10 MG tablet Commonly known as: ZYPREXA        TAKE these medications      Indication  ARIPiprazole  10 MG tablet Commonly known as: ABILIFY  Take 1 tablet (10 mg total) by mouth daily.  Indication: Schizophrenia   FLUoxetine  20 MG capsule Commonly known as: PROZAC  Take 1 capsule (20 mg total) by mouth daily.  Indication: Depression        Follow-up Information     [facility] Go on 05/20/2024.   Specialty: Professional Counselor Why: Please go to this provider on 05/20/24 at 9:00 am for an assessment, to obtain therapy services.  You may also go Monday through Friday, from 9 am to 1  pm. Contact information: [address]  [address] [place] [address] [phone]         [facility]. Go on 05/21/2024.   Specialty: Behavioral Health Why: Please go to this provider on 05/21/24 at 7:00 am for an assessment, to obtain medication management services. You may also go Monday through Friday, arrive by 7:00 am for an assessment. Contact information: [address] [place]  [address] [phone]                  Signed: Leita LOISE Arts, MD 05/18/2024, 8:34 AM

## 2024-05-19 LAB — HEMOGLOBIN A1C

## 2024-05-20 NOTE — Progress Notes (Signed)
 Pt does not have a pcp and was referred to the [facility]. Pt did not screen for sdoh.

## 2024-05-29 ENCOUNTER — Other Ambulatory Visit: Payer: Self-pay

## 2024-05-29 ENCOUNTER — Emergency Department (HOSPITAL_COMMUNITY)
Admission: EM | Admit: 2024-05-29 | Discharge: 2024-05-30 | Disposition: A | Discharge status: Other Acute Inpatient Hospital | Payer: MEDICAID | Attending: Emergency Medicine | Admitting: Emergency Medicine

## 2024-05-29 ENCOUNTER — Encounter (HOSPITAL_COMMUNITY): Payer: Self-pay | Admitting: Emergency Medicine

## 2024-05-29 DIAGNOSIS — F17210 Nicotine dependence, cigarettes, uncomplicated: Secondary | ICD-10-CM | POA: Insufficient documentation

## 2024-05-29 DIAGNOSIS — F32A Depression, unspecified: Secondary | ICD-10-CM | POA: Diagnosis present

## 2024-05-29 DIAGNOSIS — Z5900 Homelessness unspecified: Secondary | ICD-10-CM | POA: Insufficient documentation

## 2024-05-29 DIAGNOSIS — R45851 Suicidal ideations: Secondary | ICD-10-CM | POA: Insufficient documentation

## 2024-05-29 LAB — COMPREHENSIVE METABOLIC PANEL WITH GFR
ALT: 31 U/L (ref 0–44)
AST: 23 U/L (ref 15–41)
Albumin: 4.2 g/dL (ref 3.5–5.0)
Alkaline Phosphatase: 59 U/L (ref 38–126)
Anion gap: 12 (ref 5–15)
BUN: 10 mg/dL (ref 6–20)
CO2: 23 mmol/L (ref 22–32)
Calcium: 9.1 mg/dL (ref 8.9–10.3)
Chloride: 104 mmol/L (ref 98–111)
Creatinine, Ser: 1.1 mg/dL (ref 0.61–1.24)
GFR, Estimated: >60 mL/min (ref >60)
Glucose, Bld: 94 mg/dL (ref 70–99)
Potassium: 3.9 mmol/L (ref 3.5–5.1)
Sodium: 139 mmol/L (ref 135–145)
Total Bilirubin: 0.7 mg/dL (ref 0.0–1.2)
Total Protein: 7.3 g/dL (ref 6.5–8.1)

## 2024-05-29 LAB — CBC
HCT: 42.5 % (ref 39.0–52.0)
Hemoglobin: 14.6 g/dL (ref 13.0–17.0)
MCH: 32.4 pg (ref 26.0–34.0)
MCHC: 34.4 g/dL (ref 30.0–36.0)
MCV: 94.2 fL (ref 80.0–100.0)
Platelets: 287 K/uL (ref 150–400)
RBC: 4.51 MIL/uL (ref 4.22–5.81)
RDW: 12 % (ref 11.5–15.5)
WBC: 6.3 K/uL (ref 4.0–10.5)
nRBC: 0 % (ref 0.0–0.2)

## 2024-05-29 LAB — ETHANOL: Alcohol, Ethyl (B): 127 mg/dL — ABNORMAL HIGH (ref <15)

## 2024-05-29 NOTE — ED Provider Notes (Signed)
 [facility]-EMERGENCY DEPT [facility] Emergency Department Provider Note MRN:  [id number]  Arrival date & time: 05/30/24     Chief Complaint   Medical Clearance   History of Present Illness   Richard Mclean. is a 48 y.o. year-old male with a history of depression presenting to the ED with chief complaint of medical clearance.  Patient would like to check himself in psychiatrically and talk to an expert.  Has had some increased depression.  Was recently kicked out of his mother's house and is now homeless.  Was contemplating suicide this evening, came here instead.  Review of Systems  A thorough review of systems was obtained and all systems are negative except as noted in the HPI and PMH.   Patient's Health History    Past Medical History:  Diagnosis Date   Depression     History reviewed. No pertinent surgical history.  Family History  Problem Relation Age of Onset   Alcohol abuse Mother    Depression Mother    Mental illness Mother    Mental illness Father    Mental illness Sister    HIV/AIDS Brother    Mental illness Maternal Grandmother     Social History   Socioeconomic History   Marital status: Single    Spouse name: Not on file   Number of children: 0   Years of education: GED   Highest education level: GED or equivalent  Occupational History   Occupation: Unemployed  Tobacco Use   Smoking status: Every Day    Types: Cigarettes   Smokeless tobacco: Never  Vaping Use   Vaping status: Every Day  Substance and Sexual Activity   Alcohol use: Yes    Comment: Malt liquor or liquor every other weekend   Drug use: Not Currently    Types: Marijuana   Sexual activity: Yes  Other Topics Concern   Not on file  Social History Narrative   ** Merged History Encounter **       Completed social determinants screening and administered PHQ 9 along with Biopsychosocial assessment.    Social Drivers of Health   Financial Resource Strain: High Risk (05/04/2019)    Overall Financial Resource Strain (CARDIA)    Difficulty of Paying Living Expenses: Hard  Food Insecurity: Food Insecurity Present (05/11/2024)   Hunger Vital Sign    Worried About Running Out of Food in the Last Year: Sometimes true    Ran Out of Food in the Last Year: Sometimes true  Transportation Needs: No Transportation Needs (05/11/2024)   PRAPARE - Administrator, Civil Service (Medical): No    Lack of Transportation (Non-Medical): No  Physical Activity: Sufficiently Active (05/04/2019)   Exercise Vital Sign    Days of Exercise per Week: 7 days    Minutes of Exercise per Session: 30 min  Stress: Stress Concern Present (05/04/2019)   [employer] of Occupational Health - Occupational Stress Questionnaire    Feeling of Stress : Very much  Social Connections: Socially Isolated (05/04/2019)   Social Connection and Isolation Panel    Frequency of Communication with Friends and Family: Once a week    Frequency of Social Gatherings with Friends and Family: Never    Attends Religious Services: Never    Database administrator or Organizations: No    Attends Banker Meetings: Never    Marital Status: Never married  Intimate Partner Violence: Not At Risk (05/11/2024)   Humiliation, Afraid, Rape, and Kick questionnaire  Fear of Current or Ex-Partner: No    Emotionally Abused: No    Physically Abused: No    Sexually Abused: No     Physical Exam   Vitals:   05/29/24 2207  BP: (!) 137/98  Pulse: (!) 105  Resp: 17  Temp: 99.1 F (37.3 C)  SpO2: 97%    CONSTITUTIONAL: Well-appearing, NAD NEURO/PSYCH:  Alert and oriented x 3, no focal deficits EYES:  eyes equal and reactive ENT/NECK:  no LAD, no JVD CARDIO: Regular rate, well-perfused, normal S1 and S2 PULM:  CTAB no wheezing or rhonchi GI/GU:  non-distended, non-tender MSK/SPINE:  No gross deformities, no edema SKIN:  no rash, atraumatic   *Additional and/or pertinent findings included in MDM  below  Diagnostic and Interventional Summary    EKG Interpretation Date/Time:    Ventricular Rate:    PR Interval:    QRS Duration:    QT Interval:    QTC Calculation:   R Axis:      Text Interpretation:         Labs Reviewed  ETHANOL - Abnormal; Notable for the following components:      Result Value   Alcohol, Ethyl (B) 127 (*)    All other components within normal limits  COMPREHENSIVE METABOLIC PANEL WITH GFR  CBC  RAPID URINE DRUG SCREEN, HOSP PERFORMED    No orders to display    Medications - No data to display   Procedures  /  Critical Care Procedures  ED Course and Medical Decision Making  Initial Impression and Ddx Suicidal ideation, history of psychiatric illness and substance use disorder.  Here voluntarily.  Also newly homeless and so could be here partially for secondary gain.  Consulting TTS, awaiting medical clearance.  Past medical/surgical history that increases complexity of ED encounter: Psychiatric illness, substance use disorder  Interpretation of Diagnostics I personally reviewed the Laboratory Testing and my interpretation is as follows: No significant blood count or electrolyte disturbance.    Patient Reassessment and Ultimate Disposition/Management     Patient is now medically cleared awaiting TTS recommendations.  Signed out to oncoming provider.  Patient management required discussion with the following services or consulting groups:  None  Complexity of Problems Addressed Acute illness or injury that poses threat of life of bodily function  Additional Data Reviewed and Analyzed Further history obtained from: Prior ED visit notes, Recent Consult notes, and Prior labs/imaging results  Additional Factors Impacting ED Encounter Risk Consideration of hospitalization  Ozell HERO. Theadore, MD [facility] Emergency Medicine [facility] [email or web address]  Final Clinical Impressions(s) / ED Diagnoses     ICD-10-CM    1. Suicidal ideation  R45.851       ED Discharge Orders     None        Discharge Instructions Discussed with and Provided to Patient:   Discharge Instructions   None      Theadore Ozell HERO, MD 05/30/24 [phone]
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
## 2024-05-29 NOTE — ED Notes (Signed)
Pt speaking with TTS at this time.  

## 2024-05-29 NOTE — ED Notes (Signed)
 Pt talking loudly in the hallway, pt calling staff by racial language. Pt with flight of ideas.

## 2024-05-30 ENCOUNTER — Other Ambulatory Visit: Payer: Self-pay

## 2024-05-30 ENCOUNTER — Inpatient Hospital Stay (HOSPITAL_COMMUNITY)
Admission: AD | Admit: 2024-05-30 | Discharge: 2024-06-03 | DRG: 885 | Disposition: A | Discharge status: Home / Self Care | Payer: MEDICAID | Source: Intra-hospital | Attending: Psychiatry | Admitting: Psychiatry

## 2024-05-30 ENCOUNTER — Encounter (HOSPITAL_COMMUNITY): Payer: Self-pay | Admitting: Nurse Practitioner

## 2024-05-30 DIAGNOSIS — F29 Unspecified psychosis not due to a substance or known physiological condition: Secondary | ICD-10-CM | POA: Diagnosis not present

## 2024-05-30 DIAGNOSIS — Z830 Family history of human immunodeficiency virus [HIV] disease: Secondary | ICD-10-CM | POA: Diagnosis not present

## 2024-05-30 DIAGNOSIS — F17290 Nicotine dependence, other tobacco product, uncomplicated: Secondary | ICD-10-CM | POA: Diagnosis present

## 2024-05-30 DIAGNOSIS — Z5900 Homelessness unspecified: Secondary | ICD-10-CM | POA: Diagnosis not present

## 2024-05-30 DIAGNOSIS — F419 Anxiety disorder, unspecified: Secondary | ICD-10-CM | POA: Diagnosis present

## 2024-05-30 DIAGNOSIS — F332 Major depressive disorder, recurrent severe without psychotic features: Principal | ICD-10-CM | POA: Diagnosis present

## 2024-05-30 DIAGNOSIS — F250 Schizoaffective disorder, bipolar type: Principal | ICD-10-CM | POA: Diagnosis present

## 2024-05-30 DIAGNOSIS — Z5941 Food insecurity: Secondary | ICD-10-CM | POA: Diagnosis not present

## 2024-05-30 DIAGNOSIS — R45851 Suicidal ideations: Secondary | ICD-10-CM | POA: Diagnosis present

## 2024-05-30 DIAGNOSIS — Z811 Family history of alcohol abuse and dependence: Secondary | ICD-10-CM | POA: Diagnosis not present

## 2024-05-30 DIAGNOSIS — Z79899 Other long term (current) drug therapy: Secondary | ICD-10-CM | POA: Diagnosis not present

## 2024-05-30 DIAGNOSIS — Z818 Family history of other mental and behavioral disorders: Secondary | ICD-10-CM | POA: Diagnosis not present

## 2024-05-30 DIAGNOSIS — F1490 Cocaine use, unspecified, uncomplicated: Secondary | ICD-10-CM | POA: Diagnosis present

## 2024-05-30 DIAGNOSIS — Z560 Unemployment, unspecified: Secondary | ICD-10-CM | POA: Diagnosis not present

## 2024-05-30 DIAGNOSIS — F17210 Nicotine dependence, cigarettes, uncomplicated: Secondary | ICD-10-CM | POA: Diagnosis present

## 2024-05-30 DIAGNOSIS — Z5986 Financial insecurity: Secondary | ICD-10-CM

## 2024-05-30 DIAGNOSIS — F1010 Alcohol abuse, uncomplicated: Secondary | ICD-10-CM | POA: Diagnosis present

## 2024-05-30 DIAGNOSIS — Z604 Social exclusion and rejection: Secondary | ICD-10-CM | POA: Diagnosis present

## 2024-05-30 DIAGNOSIS — F32A Depression, unspecified: Secondary | ICD-10-CM | POA: Diagnosis not present

## 2024-05-30 HISTORY — DX: Alcohol use, unspecified, uncomplicated: F10.90

## 2024-05-30 HISTORY — DX: Bipolar disorder, unspecified: F31.9

## 2024-05-30 HISTORY — DX: Schizophrenia, unspecified: F20.9

## 2024-05-30 HISTORY — DX: Schizotypal disorder: F21

## 2024-05-30 LAB — RAPID URINE DRUG SCREEN, HOSP PERFORMED
Amphetamines: NOT DETECTED
Barbiturates: NOT DETECTED
Benzodiazepines: NOT DETECTED
Cocaine: NOT DETECTED
Opiates: NOT DETECTED
Tetrahydrocannabinol: NOT DETECTED

## 2024-05-30 MED ORDER — HALOPERIDOL LACTATE 5 MG/ML IJ SOLN: 10.0000 mg | Freq: Three times a day (TID) | INTRAMUSCULAR | Status: DC | PRN

## 2024-05-30 MED ORDER — HALOPERIDOL 5 MG PO TABS: 5.0000 mg | ORAL_TABLET | Freq: Three times a day (TID) | ORAL | Status: DC | PRN

## 2024-05-30 MED ORDER — ALUM & MAG HYDROXIDE-SIMETH 200-200-20 MG/5ML PO SUSP
30.0000 mL | ORAL | Status: DC | PRN
  Administered 2024-06-01: 30 mL via ORAL
  Filled 2024-05-30: qty 30

## 2024-05-30 MED ORDER — DIPHENHYDRAMINE HCL 50 MG/ML IJ SOLN: 50.0000 mg | Freq: Three times a day (TID) | INTRAMUSCULAR | Status: DC | PRN

## 2024-05-30 MED ORDER — NICOTINE 14 MG/24HR TD PT24
14.0000 mg | MEDICATED_PATCH | Freq: Once | TRANSDERMAL | Status: DC
  Administered 2024-05-30: 14 mg via TRANSDERMAL
  Filled 2024-05-30 (×2): qty 1

## 2024-05-30 MED ORDER — LORAZEPAM 2 MG/ML IJ SOLN: 2.0000 mg | Freq: Three times a day (TID) | INTRAMUSCULAR | Status: DC | PRN

## 2024-05-30 MED ORDER — TRAZODONE HCL 50 MG PO TABS
50.0000 mg | ORAL_TABLET | Freq: Every evening | ORAL | Status: DC | PRN
  Administered 2024-05-30 – 2024-05-31 (×2): 50 mg via ORAL
  Filled 2024-05-30 (×2): qty 1

## 2024-05-30 MED ORDER — HYDROXYZINE HCL 25 MG PO TABS
25.0000 mg | ORAL_TABLET | Freq: Three times a day (TID) | ORAL | Status: DC | PRN
  Administered 2024-05-30 – 2024-05-31 (×2): 25 mg via ORAL
  Filled 2024-05-30 (×2): qty 1

## 2024-05-30 MED ORDER — NICOTINE POLACRILEX 2 MG MT GUM
2.0000 mg | CHEWING_GUM | OROMUCOSAL | Status: DC | PRN
  Administered 2024-05-30 – 2024-06-03 (×11): 2 mg via ORAL
  Filled 2024-05-30 (×5): qty 1

## 2024-05-30 MED ORDER — MAGNESIUM HYDROXIDE 400 MG/5ML PO SUSP: 30.0000 mL | Freq: Every day | ORAL | Status: DC | PRN

## 2024-05-30 MED ORDER — ACETAMINOPHEN 325 MG PO TABS: 650.0000 mg | ORAL_TABLET | Freq: Four times a day (QID) | ORAL | Status: DC | PRN

## 2024-05-30 MED ORDER — HALOPERIDOL LACTATE 5 MG/ML IJ SOLN: 5.0000 mg | Freq: Three times a day (TID) | INTRAMUSCULAR | Status: DC | PRN

## 2024-05-30 MED ORDER — DIPHENHYDRAMINE HCL 25 MG PO CAPS: 50.0000 mg | ORAL_CAPSULE | Freq: Three times a day (TID) | ORAL | Status: DC | PRN

## 2024-05-30 NOTE — [facility] Group Notes (Signed)
 [facility] Group Notes:  (Nursing/MHT/Case Management/Adjunct)  Date:  05/30/2024  Time:  8:51 PM  Type of Therapy:  Wrap-up group  Participation Level:  Active  Participation Quality:  Appropriate  Affect:  Appropriate  Cognitive:  Appropriate  Insight:  Appropriate  Engagement in Group:  Engaged  Modes of Intervention:  Education  Summary of Progress/Problems:Goal to get medication. Rated day 6/10.  Richard Mclean 05/30/2024, 8:51 PM
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
## 2024-05-30 NOTE — Tx Team (Signed)
 Initial Treatment Plan 05/30/2024 6:52 PM Sherida W Junkins Jr. FMW:[id number]    PATIENT STRESSORS: Financial difficulties   Marital or family conflict   Substance abuse     PATIENT STRENGTHS: Motivation for treatment/growth  Supportive family/friends    PATIENT IDENTIFIED PROBLEMS:  To work on my depression  To get back on my medication  Medication noncompliance  Substance Abuse  Homelessness  Ineffective coping skills           DISCHARGE CRITERIA:  Ability to meet basic life and health needs Safe-care adequate arrangements made  PRELIMINARY DISCHARGE PLAN: Attend aftercare/continuing care group Outpatient therapy Return to previous living arrangement  PATIENT/FAMILY INVOLVEMENT: This treatment plan has been presented to and reviewed with the patient, Carney W Willaims Jr., and/or family member.  The patient and family have been given the opportunity to ask questions and make suggestions.  Almarie MALVA Lowers, RN 05/30/2024, 6:52 PM

## 2024-05-30 NOTE — Progress Notes (Signed)
   05/30/24 2023  Psych Admission Type (Psych Patients Only)  Admission Status Voluntary  Psychosocial Assessment  Patient Complaints Depression  Eye Contact Fair  Facial Expression Animated  Affect Appropriate to circumstance  Speech Logical/coherent  Interaction Assertive  Motor Activity Other (Comment) (WDL)  Appearance/Hygiene In scrubs  Behavior Characteristics Appropriate to situation  Mood Depressed  Thought Process  Coherency WDL  Content WDL  Delusions None reported or observed  Perception WDL  Judgment Poor  Confusion None  Danger to Self  Current suicidal ideation? Denies  Agreement Not to Harm Self Yes  Description of Agreement verbal  Danger to Others  Danger to Others None reported or observed
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
## 2024-05-30 NOTE — ED Provider Notes (Signed)
 Emergency Medicine Observation Re-evaluation Note  Richard Mclean. is a 48 y.o. male, seen on rounds today.  Pt initially presented to the ED for complaints of Medical Clearance Currently, the patient is awaiting placement.  Physical Exam  BP (!) 137/98 (BP Location: Right Arm)   Pulse (!) 105   Temp 99.1 F (37.3 C) (Oral)   Resp 17   SpO2 97%  Physical Exam Resting comfortably  ED Course / MDM  EKG:   I have reviewed the labs performed to date as well as medications administered while in observation.  Recent changes in the last 24 hours include medical clearance done.  Plan  Current plan is for awaiting psych placement.    Suzette Pac, MD 05/30/24 [phone]
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
## 2024-05-30 NOTE — ED Provider Notes (Signed)
 Patient is medically cleared and can be disposition to a psych facility when available   Suzette Pac, MD 05/30/24 [phone]

## 2024-05-30 NOTE — Progress Notes (Signed)
 [facility] LCSW Progress Note   05/30/2024    12:02 PM  Richard Mclean   [id number]   Type of Contact and Topic:  Psychiatric Bed Placement   Pt accepted to [facility] 301-1     Patient meets inpatient criteria per Roxianne Olp, NP    The attending provider will be Dr. Prentis  Call report to [phone]    Jon Coup, RN @ [facility] notified.     Pt scheduled  to arrive at [facility] TODAY.    Lena Gores, MSW, LCSW-A  12:04 PM 05/30/2024

## 2024-05-31 DIAGNOSIS — F250 Schizoaffective disorder, bipolar type: Principal | ICD-10-CM | POA: Diagnosis present

## 2024-05-31 DIAGNOSIS — F29 Unspecified psychosis not due to a substance or known physiological condition: Principal | ICD-10-CM

## 2024-05-31 MED ORDER — ARIPIPRAZOLE 15 MG PO TABS
15.0000 mg | ORAL_TABLET | Freq: Every day | ORAL | Status: DC
  Administered 2024-05-31 – 2024-06-01 (×2): 15 mg via ORAL
  Filled 2024-05-31 (×2): qty 1

## 2024-05-31 MED ORDER — FLUOXETINE HCL 20 MG PO CAPS
20.0000 mg | ORAL_CAPSULE | Freq: Every day | ORAL | Status: DC
  Administered 2024-05-31 – 2024-06-01 (×2): 20 mg via ORAL
  Filled 2024-05-31 (×2): qty 1

## 2024-05-31 NOTE — Group Note (Signed)
 Date:  05/31/2024 Time:  5:53 PM  Group Topic/Focus:  The focus of this group is to introduce the topic of wellness and how collage can be used as a creative outlet for expressing emotions, reducing anxiety, while fostering group cohesion and enabling personal insight and healing.    Participation Level:  Active  Participation Quality:  Appropriate and Attentive  Affect:  Appropriate  Cognitive:  Alert and Appropriate  Insight: Good  Engagement in Group:  Engaged  Modes of Intervention:  Activity, Discussion, Exploration, Rapport Building, Socialization, and Support  Additional Comments:    Richard Mclean 05/31/2024, 5:53 PM

## 2024-05-31 NOTE — Group Note (Signed)
 Date:  05/31/2024 Time:  10:07 AM  Group Topic/Focus:  Coping Mechanisms This group focused on coping mechanisms and understanding the importance of developing healthier ways to manage stress, emotional distress, and mental health challenges. The group explored healthy vs. nonhealthy coping mechanisms, emphasizing how some behaviors like substance abuse or avoidance can worsen mental health, while others like adequate sleep and food and relaxation techniques can lead to better emotional regulation and overall well-being.  A worksheet was provided to participants and highlighted adaptive vs. maladaptive coping mechanisms. Participants were encouraged to share their personal experiences and engage in conversation focused on resilience, improving self-awareness, and practicing deep breathing as a positive coping mechanism.    Participation Level:  Active  Participation Quality:  Appropriate and Attentive  Affect:  Appropriate  Cognitive:  Alert and Appropriate  Insight: Appropriate and Improving  Engagement in Group:  Engaged and Improving  Modes of Intervention:  Discussion, Education, and Problem-solving  Additional Comments:  Pt attended and participated in this group.  Kristi HERO Izaya Netherton 05/31/2024, 10:07 AM

## 2024-05-31 NOTE — Progress Notes (Signed)
(  Sleep Hours) - 7.75 hours (Any PRNs that were needed, meds refused, or side effects to meds)-  Vistaril  25 mg, Trazodone  50 mg (Any disturbances and when (visitation, over night)- None (Concerns raised by the patient)-  None (SI/HI/AVH)-  Denies

## 2024-05-31 NOTE — Group Note (Signed)
 Date:  05/31/2024 Time:  9:28 AM  Group Topic/Focus:  Goals Group:   The focus of this group is to help patients establish daily goals to achieve during treatment and discuss how the patient can incorporate goal setting into their daily lives to aide in recovery.    Participation Level:  Active  Participation Quality:  Appropriate and Attentive  Affect:  Appropriate  Cognitive:  Alert and Appropriate  Insight: Appropriate and Improving  Engagement in Group:  Engaged and Improving  Modes of Intervention:  Discussion and Exploration  Additional Comments:  Pt attended and participated in goals group.  Kristi HERO Kirk Basquez 05/31/2024, 9:28 AM

## 2024-05-31 NOTE — Progress Notes (Signed)
   05/31/24 0800  Psych Admission Type (Psych Patients Only)  Admission Status Involuntary  Psychosocial Assessment  Patient Complaints Depression  Eye Contact Fair  Facial Expression Animated  Affect Appropriate to circumstance  Speech Logical/coherent  Interaction Assertive  Motor Activity Other (Comment) (WNL for pt)  Appearance/Hygiene In scrubs  Behavior Characteristics Appropriate to situation  Mood Depressed  Thought Process  Coherency WDL  Content WDL  Delusions None reported or observed  Perception WDL  Hallucination None reported or observed  Judgment Poor  Confusion None  Danger to Self  Current suicidal ideation? Denies  Agreement Not to Harm Self Yes  Description of Agreement verbal  Danger to Others  Danger to Others None reported or observed
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
## 2024-05-31 NOTE — Group Note (Signed)
 [facility] LCSW Group Therapy Note  Date/Time:  @TD @ 10:00-11:00  Type of Therapy and Topic:  Group Therapy:  Personal Bill of Rights  Participation Level:  Active   Description of Group This process group involved a discussion with and between patients about Understanding self.   The difference between healthy and unhealthy coping skills was described, then examples were elicited from group members for their Personal bill of Rights.  This then was followed by a discussion about boundaries, respects and what the patient wants, what it is, how important it is, and why we choose the coping techniques we choose.  Participants were encouraged to think about knowing what they want as necessary and positive.  Therapeutic Goals Patient will identify and describe what their boundary consists of Patient will participate in generating ideas when they use their coping skills to address their boundary  Patients will be supportive of one another and receive support from others Patients will understand the need all humans have to   Summary of Patient Progress:  The patient expressed knowledge about topic and would share his opinion   Therapeutic Modalities Brief Solution-Focused Therapy Psychoeducation  [place] Perra O Brit Wernette, LCSWA 05/31/2024  1:42 PM

## 2024-05-31 NOTE — [facility] Group Notes (Signed)
 [facility] Group Notes:  (Nursing/MHT/Case Management/Adjunct)  Date:  05/31/2024  Time:  2000  Type of Therapy:  Wrap up group  Participation Level:  Active  Participation Quality:  Appropriate, Attentive, Sharing, and Supportive  Affect:  Appropriate  Cognitive:  Alert  Insight:  Limited  Engagement in Group:  Developing/Improving  Modes of Intervention:  Clarification, Education, and Support  Summary of Progress/Problems: Positive thinking and positive change were discussed.   Lenora Manuelita RAMAN 05/31/2024, 8:46 PM

## 2024-05-31 NOTE — Plan of Care (Signed)
   Problem: Education: Goal: Knowledge of [place] [facility] Education information/materials will improve Outcome: Progressing

## 2024-05-31 NOTE — Progress Notes (Signed)
   05/31/24 2027  Psych Admission Type (Psych Patients Only)  Admission Status Involuntary  Psychosocial Assessment  Patient Complaints Depression  Eye Contact Fair  Facial Expression Animated  Affect Appropriate to circumstance  Speech Logical/coherent  Interaction Assertive  Motor Activity Other (Comment) (WDL)  Appearance/Hygiene In scrubs  Behavior Characteristics Appropriate to situation  Mood Depressed  Thought Process  Coherency WDL  Content WDL  Delusions None reported or observed  Perception WDL  Hallucination None reported or observed  Judgment Poor  Confusion None  Danger to Self  Current suicidal ideation? Denies  Agreement Not to Harm Self Yes  Description of Agreement verbal  Danger to Others  Danger to Others None reported or observed

## 2024-05-31 NOTE — Plan of Care (Signed)
   Problem: Education: Goal: Emotional status will improve Outcome: Progressing Goal: Mental status will improve Outcome: Progressing   Problem: Activity: Goal: Interest or engagement in activities will improve Outcome: Progressing

## 2024-06-01 ENCOUNTER — Encounter (HOSPITAL_COMMUNITY): Payer: Self-pay

## 2024-06-01 ENCOUNTER — Encounter (HOSPITAL_COMMUNITY): Payer: Self-pay | Admitting: Nurse Practitioner

## 2024-06-01 LAB — LIPID PANEL
Cholesterol: 208 mg/dL — ABNORMAL HIGH (ref 0–200)
HDL: 37 mg/dL — ABNORMAL LOW (ref >40)
LDL Cholesterol: 102 mg/dL — ABNORMAL HIGH (ref 0–99)
Total CHOL/HDL Ratio: 5.6 ratio
Triglycerides: 346 mg/dL — ABNORMAL HIGH (ref <150)
VLDL: 69 mg/dL — ABNORMAL HIGH (ref 0–40)

## 2024-06-01 LAB — VITAMIN D 25 HYDROXY (VIT D DEFICIENCY, FRACTURES): Vit D, 25-Hydroxy: 32.44 ng/mL (ref 30–100)

## 2024-06-01 LAB — HEMOGLOBIN A1C
Hgb A1c MFr Bld: 5.2 % (ref 4.8–5.6)
Mean Plasma Glucose: 102.54 mg/dL

## 2024-06-01 LAB — FOLATE: Folate: 19.3 ng/mL (ref >5.9)

## 2024-06-01 LAB — VITAMIN B12: Vitamin B-12: 260 pg/mL (ref 180–914)

## 2024-06-01 MED ORDER — NALTREXONE HCL 50 MG PO TABS
25.0000 mg | ORAL_TABLET | Freq: Every day | ORAL | Status: DC
  Administered 2024-06-01 – 2024-06-03 (×3): 25 mg via ORAL
  Filled 2024-06-01 (×3): qty 1

## 2024-06-01 MED ORDER — ESCITALOPRAM OXALATE 10 MG PO TABS
10.0000 mg | ORAL_TABLET | Freq: Every day | ORAL | Status: DC
  Administered 2024-06-01 – 2024-06-02 (×2): 10 mg via ORAL
  Filled 2024-06-01: qty 1
  Filled 2024-06-01: qty 7
  Filled 2024-06-01: qty 1

## 2024-06-01 MED ORDER — ARIPIPRAZOLE 15 MG PO TABS
15.0000 mg | ORAL_TABLET | Freq: Every day | ORAL | Status: DC
  Administered 2024-06-02: 15 mg via ORAL
  Filled 2024-06-01: qty 1
  Filled 2024-06-01: qty 7

## 2024-06-01 MED ORDER — HYDROXYZINE HCL 50 MG PO TABS
50.0000 mg | ORAL_TABLET | Freq: Three times a day (TID) | ORAL | Status: DC | PRN
  Administered 2024-06-01 – 2024-06-02 (×2): 50 mg via ORAL
  Filled 2024-06-01 (×2): qty 1

## 2024-06-01 MED ORDER — TRAZODONE HCL 100 MG PO TABS
100.0000 mg | ORAL_TABLET | Freq: Every evening | ORAL | Status: DC | PRN
  Administered 2024-06-01 – 2024-06-02 (×2): 100 mg via ORAL
  Filled 2024-06-01 (×2): qty 1

## 2024-06-01 NOTE — Progress Notes (Signed)
 [facility] MD Progress Note  06/01/2024 1:24 PM Richard Mclean.  MRN:  [id number] Principal Problem: Schizoaffective disorder, mixed type (HCC) Diagnosis: Principal Problem:   Schizoaffective disorder, mixed type (HCC)   ID & Admission Information: Richard Mclean. is an 48 y.o. male who  has a past medical history of Alcohol use disorder, Bipolar disorder, unspecified (HCC), Depression, MDD (major depressive disorder), recurrent episode, severe (HCC) (05/30/2024), Schizophrenia (HCC), and Schizotypal personality disorder (HCC).  He presented on 05/30/2024  6:32 PM for Schizoaffective disorder, mixed type (HCC).  He has a history of an unclear psychotic disorder who was recently admitted twice to [facility] in the month of August for apparent psychosis and bizarre behaviors. He was last hospitalized from 8/18 - 8/25 after presenting with suicidal ideation.  This admission, he presented for SI with plan to jump off a bridge, and appeared actively psychotic.  Subjective:   Case was discussed in the multidisciplinary team. MAR was reviewed and patient was compliant with medications.  No acute events occurred overnight.  PRN's in last 24 hours: Atarax  25 mg and trazodone  50 mg administered at 2109 Nicotine  gum  Psychiatric Team made the following recommendations yesterday: Abilify  prescribed at 15 mg and Prozac  at 20 mg.  The patient reports that he slept subjectively well despite being documented to have a little over 5 hours.  He reports adequate appetite.  He denies feeling depressed at this time, and actually presents as hypomanic.  He denies anxiety, but is visibly anxious during the exam.  He denies auditory or visual hallucinations.  He endorses persecutory delusions.  After the interview, I saw him pacing the hall and making bizarre nonsensical comments.  He denies suicidal or homicidal ideation.  He reports that Abilify  is making him feel tired, so we discussed switching it to bedtime.  We also  discussed switching Prozac  to Lexapro  as anxiety seems to be a significant problem.  Will also start naltrexone  for alcohol use disorder.   Past Psychiatric and Medical Medical History:  Past Medical History:  Diagnosis Date   Alcohol use disorder    Bipolar disorder, unspecified (HCC)    Depression    MDD (major depressive disorder), recurrent episode, severe (HCC) 05/30/2024   Schizophrenia (HCC)    Schizotypal personality disorder (HCC)     History reviewed. No pertinent surgical history.  Family History(Medical and Psychiatric):  Family History  Problem Relation Age of Onset   Alcohol abuse Mother    Depression Mother    Mental illness Mother    Mental illness Father    Mental illness Sister    HIV/AIDS Brother    Mental illness Maternal Grandmother    See history and physical    Social History:  Social History   Substance and Sexual Activity  Alcohol Use Yes   Comment: Malt liquor or liquor every other weekend     Social History   Substance and Sexual Activity  Drug Use Not Currently   Types: Marijuana    Social History   Socioeconomic History   Marital status: Single    Spouse name: Not on file   Number of children: 0   Years of education: GED   Highest education level: GED or equivalent  Occupational History   Occupation: Unemployed  Tobacco Use   Smoking status: Every Day    Types: Cigarettes   Smokeless tobacco: Never  Vaping Use   Vaping status: Every Day  Substance and Sexual Activity   Alcohol use:  Yes    Comment: Malt liquor or liquor every other weekend   Drug use: Not Currently    Types: Marijuana   Sexual activity: Yes  Other Topics Concern   Not on file  Social History Narrative   ** Merged History Encounter **       Completed social determinants screening and administered PHQ 9 along with Biopsychosocial assessment.    Social Drivers of Health   Financial Resource Strain: High Risk (05/04/2019)   Overall Financial Resource  Strain (CARDIA)    Difficulty of Paying Living Expenses: Hard  Food Insecurity: Food Insecurity Present (05/30/2024)   Hunger Vital Sign    Worried About Running Out of Food in the Last Year: Sometimes true    Ran Out of Food in the Last Year: Sometimes true  Transportation Needs: No Transportation Needs (05/30/2024)   PRAPARE - Administrator, Civil Service (Medical): No    Lack of Transportation (Non-Medical): No  Physical Activity: Sufficiently Active (05/04/2019)   Exercise Vital Sign    Days of Exercise per Week: 7 days    Minutes of Exercise per Session: 30 min  Stress: Stress Concern Present (05/04/2019)   [employer] of Occupational Health - Occupational Stress Questionnaire    Feeling of Stress : Very much  Social Connections: Socially Isolated (05/04/2019)   Social Connection and Isolation Panel    Frequency of Communication with Friends and Family: Once a week    Frequency of Social Gatherings with Friends and Family: Never    Attends Religious Services: Never    Database administrator or Organizations: No    Attends Engineer, structural: Never    Marital Status: Never married        Current Medications: Current Facility-Administered Medications  Medication Dose Route Frequency Provider Last Rate Last Admin   acetaminophen  (TYLENOL ) tablet 650 mg  650 mg Oral Q6H PRN White, Patrice L, NP       alum & mag hydroxide-simeth (MAALOX/MYLANTA) 200-200-20 MG/5ML suspension 30 mL  30 mL Oral Q4H PRN White, Patrice L, NP       [START ON 06/02/2024] ARIPiprazole  (ABILIFY ) tablet 15 mg  15 mg Oral QHS Kennyth Starleen RAMAN, MD       haloperidol  (HALDOL ) tablet 5 mg  5 mg Oral TID PRN White, Patrice L, NP       And   diphenhydrAMINE  (BENADRYL ) capsule 50 mg  50 mg Oral TID PRN White, Patrice L, NP       haloperidol  lactate (HALDOL ) injection 5 mg  5 mg Intramuscular TID PRN White, Patrice L, NP       And   diphenhydrAMINE  (BENADRYL ) injection 50 mg  50 mg  Intramuscular TID PRN White, Patrice L, NP       And   LORazepam  (ATIVAN ) injection 2 mg  2 mg Intramuscular TID PRN White, Patrice L, NP       haloperidol  lactate (HALDOL ) injection 10 mg  10 mg Intramuscular TID PRN White, Patrice L, NP       And   diphenhydrAMINE  (BENADRYL ) injection 50 mg  50 mg Intramuscular TID PRN White, Patrice L, NP       And   LORazepam  (ATIVAN ) injection 2 mg  2 mg Intramuscular TID PRN White, Patrice L, NP       escitalopram  (LEXAPRO ) tablet 10 mg  10 mg Oral QHS Kennyth Starleen RAMAN, MD       hydrOXYzine  (ATARAX ) tablet 50 mg  50 mg Oral TID PRN Jerrell Mangel S, MD       magnesium  hydroxide (MILK OF MAGNESIA) suspension 30 mL  30 mL Oral Daily PRN White, Patrice L, NP       naltrexone  (DEPADE) tablet 25 mg  25 mg Oral Daily Taleya Whitcher S, MD   25 mg at 06/01/24 1320   nicotine  polacrilex (NICORETTE ) gum 2 mg  2 mg Oral PRN Bobbitt, Shalon E, NP   2 mg at 06/01/24 1321   traZODone  (DESYREL ) tablet 100 mg  100 mg Oral QHS PRN Kennyth Starleen RAMAN, MD        Lab Results:  No results found for this or any previous visit (from the past 48 hours).  Blood Alcohol level:  Lab Results  Component Value Date   ETH 127 (H) 05/29/2024   ETH <15 05/11/2024    Metabolic Disorder Labs: Lab Results  Component Value Date   HGBA1C 5.4 05/04/2024   MPG 108 05/04/2024   MPG PENDING 05/02/2024   No results found for: PROLACTIN Lab Results  Component Value Date   CHOL 182 05/04/2024   TRIG 84 05/04/2024   HDL 52 05/04/2024   CHOLHDL 3.5 05/04/2024   VLDL 17 05/04/2024   LDLCALC 113 (H) 05/04/2024   LDLCALC 135 (H) 05/02/2024    Physical Findings: AIMS:  , ,  ,  ,    CIWA:    COWS:     Psychiatric Specialty Exam:  Presentation  General Appearance: Disheveled; Bizarre  Eye Contact: Good  Speech: Pressured; Clear and Coherent  Speech Volume: Increased  Handedness: Right   Mood and Affect  Mood: Anxious; Euphoric  Affect: Inappropriate; Full  Range   Thought Process  Thought Processes: Disorganized  Descriptions of Associations: Intact  Orientation: Partial  Thought Content: Illogical; Paranoid Ideation  History of Schizophrenia/Schizoaffective disorder: Yes  Duration of Psychotic Symptoms: NA Hallucinations: Hallucinations: None  Ideas of Reference: None  Suicidal Thoughts: Suicidal Thoughts: No  Homicidal Thoughts: Homicidal Thoughts: No   Sensorium  Memory: Immediate Fair  Judgment: Poor  Insight: Poor   Executive Functions  Concentration: Fair  Attention Span: Fair  Recall: Good  Fund of Knowledge: Fair  Language: Good   Psychomotor Activity  Psychomotor Activity: Psychomotor Activity: Increased; Restlessness   Assets  Assets: Manufacturing systems engineer; Desire for Improvement; Resilience   Sleep  Sleep: Sleep: Fair   Musculoskeletal: Strength & Muscle Tone: within normal limits Gait & Station: normal Patient leans: N/A   Physical Exam: General: Sitting comfortably. NAD. HEENT: Normocephalic, atraumatic, MMM, EMOI Lungs: no increased work of breathing noted Heart: no cyanosis Abdomen: Non distended Musculoskeletal: FROM. No obvious deformities Skin: Warm, dry, intact. No rashes noted Neuro: No obvious focal deficits.  Gait and station are normal  Review of Systems  Constitutional: Negative.   HENT: Negative.    Eyes: Negative.   Respiratory: Negative.    Cardiovascular: Negative.   Gastrointestinal: Negative.   Genitourinary: Negative.   Skin: Negative.   Neurological: Negative.   Psychiatric/Behavioral:  Positive for anxiety, rapid speech.     Blood pressure 125/81, pulse 71, temperature 98.1 F (36.7 C), temperature source Oral, resp. rate 18, height 5' 9 (1.753 m), weight 93 kg, SpO2 99%. Body mass index is 30.27 kg/m.  ASSESSMENT: Richard Mclean. is an 48 y.o. male who  has a past medical history of Alcohol use disorder, Bipolar disorder, unspecified (HCC),  Depression, MDD (major depressive disorder), recurrent episode, severe (HCC) (05/30/2024), Schizophrenia (HCC),  and Schizotypal personality disorder (HCC).  He presented on 05/30/2024  6:32 PM for Schizoaffective disorder, mixed type (HCC).  He presented with suicidal ideation and plan to jump off a bridge.  Diagnoses / Active Problems: Patient Active Problem List   Diagnosis Date Noted   Schizoaffective disorder, mixed type (HCC) 05/31/2024      PLAN: Safety and Monitoring:  -- Involuntary admission to inpatient psychiatric unit for safety, stabilization and treatment  -- Daily contact with patient to assess and evaluate symptoms and progress in treatment  -- Patient's case to be discussed in multi-disciplinary team meeting  -- Observation Level : q15 minute checks  -- Vital signs:  q12 hours  -- Precautions: suicide, elopement, and assault  2. Psychiatric Diagnoses and Treatment:  Patient Active Problem List   Diagnosis Date Noted   Schizoaffective disorder, mixed type (HCC) 05/31/2024     Scheduled Medications:  [START ON 06/02/2024] ARIPiprazole   15 mg Oral QHS   escitalopram   10 mg Oral QHS   naltrexone   25 mg Oral Daily    As Needed Medications: acetaminophen , alum & mag hydroxide-simeth, haloperidol  **AND** diphenhydrAMINE , haloperidol  lactate **AND** diphenhydrAMINE  **AND** LORazepam , haloperidol  lactate **AND** diphenhydrAMINE  **AND** LORazepam , hydrOXYzine , magnesium  hydroxide, nicotine  polacrilex, traZODone     3. Medical Issues Being Addressed:   -- Patient denies chronic medical problems  Labs reviewed, mostly unremarkable   Tobacco Use Disorder  -- Nicotine  replacement as above  -- Smoking cessation encouraged  4. Discharge Planning:   -- Social work and case management to assist with discharge planning and identification of hospital follow-up needs prior to discharge  -- Estimated LOS: 5 to 7 days  -- Discharge Concerns: Need to establish a safety plan;  Medication compliance and effectiveness  -- Discharge Goals: Return home with outpatient referrals for mental health follow-up including medication management/psychotherapy  5. Short Term Goals:  Improve ability to identify changes in lifestyle to reduce recurrence of condition, verbalize feelings, disclose and discuss suicidal ideas, demonstrate self-control, identify and develop effective coping behaviors, compliance with prescribed medications, identify triggers associated with substance abuse/mental health issues, participate in unit milieu and in scheduled group therapies   6. Long Term Goals: Improvement in symptoms so the patient is ready for discharge   --The risks/benefits/side-effects/alternatives to the medications above were discussed in detail with the patient and time was given for questions. The patient provided informed consent.   -- Metabolic profile and EKG monitoring obtained while on an atypical antipsychotic and listed in the EHR    Total Time Spent in Direct Patient Care:  I personally spent 35 minutes on the unit in direct patient care. The direct patient care time included face-to-face time with the patient, reviewing the patient's chart, communicating with other professionals, and coordinating care. Greater than 50% of this time was spent in counseling or coordinating care with the patient regarding goals of hospitalization, psycho-education, and discharge planning needs.      Glendia Kitty, MD Psychiatrist  06/01/2024, 1:24 PM   I certify that inpatient services furnished can reasonably be expected to improve the patient's condition.    Portions of this note were created using voice recognition software. Minor syntax errors, grammatical content, spelling, or punctuation errors may have occurred unintentionally. Please notify the dino if the meaning of any statement is unclear.

## 2024-06-01 NOTE — Group Note (Signed)
 Date:  06/01/2024 Time:  10:43 AM  Group Topic/Focus:  Goals Group:   The focus of this group is to help patients establish daily goals to achieve during treatment and discuss how the patient can incorporate goal setting into their daily lives to aide in recovery.    Participation Level:  Did Not Attend  Participation Quality:  Did Not Attend  Affect:  Did Not Attend  Cognitive:  Did Not Attend  Insight: None  Engagement in Group:  Did Not Attend  Modes of Intervention:  Did Not Attend  Additional Comments:    Richard Mclean 06/01/2024, 10:43 AM

## 2024-06-01 NOTE — BH IP Treatment Plan (Signed)
 Interdisciplinary Treatment and Diagnostic Plan Update  06/01/2024 Time of Session: 10:45AM Richard Mclean. MRN: [id number]  Principal Diagnosis: Schizoaffective disorder, mixed type (HCC)  Secondary Diagnoses: Principal Problem:   Schizoaffective disorder, mixed type (HCC)   Current Medications:  Current Facility-Administered Medications  Medication Dose Route Frequency Provider Last Rate Last Admin   acetaminophen  (TYLENOL ) tablet 650 mg  650 mg Oral Q6H PRN White, Patrice L, NP       alum & mag hydroxide-simeth (MAALOX/MYLANTA) 200-200-20 MG/5ML suspension 30 mL  30 mL Oral Q4H PRN White, Patrice L, NP       [START ON 06/02/2024] ARIPiprazole  (ABILIFY ) tablet 15 mg  15 mg Oral QHS Kennyth Starleen RAMAN, MD       haloperidol  (HALDOL ) tablet 5 mg  5 mg Oral TID PRN White, Patrice L, NP       And   diphenhydrAMINE  (BENADRYL ) capsule 50 mg  50 mg Oral TID PRN White, Patrice L, NP       haloperidol  lactate (HALDOL ) injection 5 mg  5 mg Intramuscular TID PRN White, Patrice L, NP       And   diphenhydrAMINE  (BENADRYL ) injection 50 mg  50 mg Intramuscular TID PRN White, Patrice L, NP       And   LORazepam  (ATIVAN ) injection 2 mg  2 mg Intramuscular TID PRN White, Patrice L, NP       haloperidol  lactate (HALDOL ) injection 10 mg  10 mg Intramuscular TID PRN White, Patrice L, NP       And   diphenhydrAMINE  (BENADRYL ) injection 50 mg  50 mg Intramuscular TID PRN White, Patrice L, NP       And   LORazepam  (ATIVAN ) injection 2 mg  2 mg Intramuscular TID PRN White, Patrice L, NP       escitalopram  (LEXAPRO ) tablet 10 mg  10 mg Oral QHS Kennyth Starleen RAMAN, MD       hydrOXYzine  (ATARAX ) tablet 50 mg  50 mg Oral TID PRN Parker, Alvin S, MD       magnesium  hydroxide (MILK OF MAGNESIA) suspension 30 mL  30 mL Oral Daily PRN White, Patrice L, NP       naltrexone  (DEPADE) tablet 25 mg  25 mg Oral Daily Parker, Alvin S, MD   25 mg at 06/01/24 1320   nicotine  polacrilex (NICORETTE ) gum 2 mg  2 mg Oral PRN  Bobbitt, Shalon E, NP   2 mg at 06/01/24 1321   traZODone  (DESYREL ) tablet 100 mg  100 mg Oral QHS PRN Parker, Alvin S, MD       PTA Medications: Medications Prior to Admission  Medication Sig Dispense Refill Last Dose/Taking   ARIPiprazole  (ABILIFY ) 10 MG tablet Take 1 tablet (10 mg total) by mouth daily. 30 tablet 0    FLUoxetine  (PROZAC ) 20 MG capsule Take 1 capsule (20 mg total) by mouth daily. 30 capsule 0     Patient Stressors: Financial difficulties   Marital or family conflict   Substance abuse    Patient Strengths: Motivation for treatment/growth  Supportive family/friends   Treatment Modalities: Medication Management, Group therapy, Case management,  1 to 1 session with clinician, Psychoeducation, Recreational therapy.   Physician Treatment Plan for Primary Diagnosis: Schizoaffective disorder, mixed type (HCC) Long Term Goal(s): Improvement in symptoms so as ready for discharge   Short Term Goals: Ability to identify changes in lifestyle to reduce recurrence of condition will improve Ability to verbalize feelings will improve Ability to disclose and  discuss suicidal ideas Ability to demonstrate self-control will improve Ability to identify and develop effective coping behaviors will improve Ability to identify triggers associated with substance abuse/mental health issues will improve  Medication Management: Evaluate patient's response, side effects, and tolerance of medication regimen.  Therapeutic Interventions: 1 to 1 sessions, Unit Group sessions and Medication administration.  Evaluation of Outcomes: Not Progressing  Physician Treatment Plan for Secondary Diagnosis: Principal Problem:   Schizoaffective disorder, mixed type (HCC)  Long Term Goal(s): Improvement in symptoms so as ready for discharge   Short Term Goals: Ability to identify changes in lifestyle to reduce recurrence of condition will improve Ability to verbalize feelings will improve Ability to  disclose and discuss suicidal ideas Ability to demonstrate self-control will improve Ability to identify and develop effective coping behaviors will improve Ability to identify triggers associated with substance abuse/mental health issues will improve     Medication Management: Evaluate patient's response, side effects, and tolerance of medication regimen.  Therapeutic Interventions: 1 to 1 sessions, Unit Group sessions and Medication administration.  Evaluation of Outcomes: Not Progressing   RN Treatment Plan for Primary Diagnosis: Schizoaffective disorder, mixed type (HCC) Long Term Goal(s): Knowledge of disease and therapeutic regimen to maintain health will improve  Short Term Goals: Ability to remain free from injury will improve, Ability to verbalize frustration and anger appropriately will improve, Ability to demonstrate self-control, Ability to participate in decision making will improve, Ability to verbalize feelings will improve, Ability to disclose and discuss suicidal ideas, and Compliance with prescribed medications will improve  Medication Management: RN will administer medications as ordered by provider, will assess and evaluate patient's response and provide education to patient for prescribed medication. RN will report any adverse and/or side effects to prescribing provider.  Therapeutic Interventions: 1 on 1 counseling sessions, Psychoeducation, Medication administration, Evaluate responses to treatment, Monitor vital signs and CBGs as ordered, Perform/monitor CIWA, COWS, AIMS and Fall Risk screenings as ordered, Perform wound care treatments as ordered.  Evaluation of Outcomes: Not Progressing   LCSW Treatment Plan for Primary Diagnosis: Schizoaffective disorder, mixed type (HCC) Long Term Goal(s): Safe transition to appropriate next level of care at discharge, Engage patient in therapeutic group addressing interpersonal concerns.  Short Term Goals: Engage patient in  aftercare planning with referrals and resources, Increase social support, Increase ability to appropriately verbalize feelings, Increase emotional regulation, Facilitate acceptance of mental health diagnosis and concerns, and Identify triggers associated with mental health/substance abuse issues  Therapeutic Interventions: Assess for all discharge needs, 1 to 1 time with Social worker, Explore available resources and support systems, Assess for adequacy in community support network, Educate family and significant other(s) on suicide prevention, Complete Psychosocial Assessment, Interpersonal group therapy.  Evaluation of Outcomes: Not Progressing   Progress in Treatment: Attending groups: Yes. Participating in groups: Yes. Taking medication as prescribed: Yes. Toleration medication: Yes. Family/Significant other contact made: No, will contact:  pt did not give consent to contact family/friends. Patient understands diagnosis: Yes. Discussing patient identified problems/goals with staff: Yes. Medical problems stabilized or resolved: Yes. Denies suicidal/homicidal ideation: Yes. Issues/concerns per patient self-inventory: No.  Patient Goals:  calm down my anxiety and depression  Discharge Plan or Barriers: Patient likely to discharge home or IRC once stable.   Reason for Continuation of Hospitalization: Anxiety Depression Medication stabilization  Estimated Length of Stay: 5-7 days  Last 3 [place] Suicide Severity Risk Score: Flowsheet Row [facility] from 05/29/2024 in  [place] [facility] Ad[facility] (Discharged) [facility]  C-SSRS RISK CATEGORY High Risk High Risk High Risk    Last PHQ 2/9 Scores:    10/31/2017    2:56 PM  Depression screen PHQ 2/9  Decreased Interest 3  Down, Depressed, Hopeless 3  PHQ - 2 Score 6   Altered sleeping 2  Tired, decreased energy 3  Change in appetite 0  Feeling bad or failure about yourself  3  Trouble concentrating 3  Moving slowly or fidgety/restless 3  Suicidal thoughts 0  PHQ-9 Score 20    Scribe for Treatment Team: Mirabella Hilario M Agron Swiney, [place] 06/01/2024 4:08 PM

## 2024-06-01 NOTE — Plan of Care (Signed)
   Problem: Coping: Goal: Ability to verbalize frustrations and anger appropriately will improve Outcome: Progressing Goal: Ability to demonstrate self-control will improve Outcome: Progressing

## 2024-06-01 NOTE — Group Note (Unsigned)
 Date:  06/01/2024 Time:  8:23 AM  Group Topic/Focus:  Goals Group:   The focus of this group is to help patients establish daily goals to achieve during treatment and discuss how the patient can incorporate goal setting into their daily lives to aide in recovery.     Participation Level:  {BHH PARTICIPATION OZCZO:77735}  Participation Quality:  {BHH PARTICIPATION QUALITY:22265}  Affect:  {BHH AFFECT:22266}  Cognitive:  {BHH COGNITIVE:22267}  Insight: {BHH Insight2:20797}  Engagement in Group:  {BHH ENGAGEMENT IN HMNLE:77731}  Modes of Intervention:  {BHH MODES OF INTERVENTION:22269}  Additional Comments:  ***  Richard Mclean 06/01/2024, 8:23 AM

## 2024-06-01 NOTE — Progress Notes (Signed)
   06/01/24 2200  Psych Admission Type (Psych Patients Only)  Admission Status Involuntary  Psychosocial Assessment  Patient Complaints Depression  Eye Contact Fair  Facial Expression Animated  Affect Appropriate to circumstance  Speech Logical/coherent  Interaction Assertive  Motor Activity Slow  Appearance/Hygiene Unremarkable  Behavior Characteristics Cooperative  Mood Pleasant;Depressed  Thought Process  Coherency WDL  Content WDL  Delusions None reported or observed  Perception WDL  Hallucination None reported or observed  Judgment Poor  Confusion None  Danger to Self  Current suicidal ideation? Denies  Agreement Not to Harm Self Yes  Description of Agreement Verbal  Danger to Others  Danger to Others None reported or observed
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
## 2024-06-01 NOTE — Plan of Care (Signed)
   Problem: Education: Goal: Verbalization of understanding the information provided will improve Outcome: Progressing   Problem: Activity: Goal: Interest or engagement in activities will improve Outcome: Progressing

## 2024-06-01 NOTE — Progress Notes (Signed)
(  Sleep Hours) - 5.75 hours (Any PRNs that were needed, meds refused, or side effects to meds)-  Trazodone  50 mg, Vistaril  25 mg (Any disturbances and when (visitation, over night)- None (Concerns raised by the patient)-  None (SI/HI/AVH)-  Denies

## 2024-06-01 NOTE — Group Note (Signed)
 Recreation Therapy Group Note   Group Topic:Stress Management  Group Date: 06/01/2024 Start Time: 0930 End Time: 1000 Facilitators: Rochell Puett-McCall, LRT,CTRS Location: [address] Dayroom   Group Topic: Stress Management   Goal Area(s) Addresses:  Patient will actively participate in stress management techniques presented during session.  Patient will successfully identify benefit of practicing stress management post d/c.   Behavioral Response:   Intervention: Relaxation exercise with ambient sound and script   Activity: Guided Imagery. LRT provided education, instruction, and demonstration on practice of visualization via guided imagery. Patient was asked to participate in the technique introduced during session. LRT debriefed including topics of mindfulness, stress management and specific scenarios each patient could use these techniques. Patients were given suggestions of ways to access scripts post d/c and encouraged to explore Youtube and other apps available on smartphones, tablets, and computers.  Education:  Stress Management, Discharge Planning.   Education Outcome: Acknowledges education   Clinical Observations/Individualized Feedback: Group did not take place due to previous group taking recreation therapy group time.    Plan: Continue to engage patient in RT group sessions 2-3x/week.   Mansfield Dann-McCall, LRT,CTRS 06/01/2024 3:17 PM

## 2024-06-01 NOTE — Progress Notes (Signed)
   06/01/24 1300  Psych Admission Type (Psych Patients Only)  Admission Status Involuntary  Psychosocial Assessment  Patient Complaints Depression  Eye Contact Fair  Facial Expression Animated  Affect Appropriate to circumstance  Speech Logical/coherent  Interaction Assertive  Motor Activity Slow  Appearance/Hygiene Unremarkable  Behavior Characteristics Cooperative  Mood Depressed  Thought Process  Coherency WDL  Content WDL  Delusions None reported or observed  Perception WDL  Hallucination None reported or observed  Judgment Poor  Confusion None  Danger to Self  Current suicidal ideation? Denies  Description of Suicide Plan No Plan  Agreement Not to Harm Self Yes  Description of Agreement Verbal  Danger to Others  Danger to Others None reported or observed

## 2024-06-01 NOTE — [facility] Group Notes (Signed)
 Adult Psychoeducational Group Note  Date:  06/01/2024 Time:  9:38 PM  Group Topic/Focus:  Wrap-Up Group:   The focus of this group is to help patients review their daily goal of treatment and discuss progress on daily workbooks.  Participation Level:  Did Not Attend  Richard Mclean 06/01/2024, 9:38 PM

## 2024-06-02 NOTE — Progress Notes (Signed)
   06/02/24 1400  Psych Admission Type (Psych Patients Only)  Admission Status Involuntary  Psychosocial Assessment  Patient Complaints Depression  Eye Contact Fair  Facial Expression Animated  Affect Appropriate to circumstance  Speech Logical/coherent  Interaction Assertive  Motor Activity Slow  Appearance/Hygiene Unremarkable  Behavior Characteristics Cooperative  Mood Depressed;Pleasant  Thought Process  Coherency WDL  Content WDL  Delusions None reported or observed  Perception WDL  Hallucination None reported or observed  Judgment Poor  Confusion None  Danger to Self  Current suicidal ideation? Denies  Description of Suicide Plan No Plan  Agreement Not to Harm Self Yes  Description of Agreement Verbal  Danger to Others  Danger to Others None reported or observed

## 2024-06-02 NOTE — Progress Notes (Signed)
   06/02/24 2100  Psych Admission Type (Psych Patients Only)  Admission Status Involuntary  Psychosocial Assessment  Patient Complaints Depression  Eye Contact Fair  Facial Expression Animated  Affect Appropriate to circumstance  Speech Logical/coherent  Interaction Assertive  Motor Activity Slow  Appearance/Hygiene Unremarkable  Behavior Characteristics Cooperative  Mood Depressed;Pleasant  Thought Process  Coherency WDL  Content WDL  Delusions None reported or observed  Perception WDL  Hallucination None reported or observed  Judgment Poor  Confusion None  Danger to Self  Current suicidal ideation? Denies  Agreement Not to Harm Self Yes  Description of Agreement Verbal  Danger to Others  Danger to Others None reported or observed

## 2024-06-02 NOTE — Plan of Care (Signed)
   Problem: Education: Goal: Emotional status will improve Outcome: Progressing Goal: Mental status will improve Outcome: Progressing   Problem: Activity: Goal: Interest or engagement in activities will improve Outcome: Progressing Goal: Sleeping patterns will improve Outcome: Progressing

## 2024-06-02 NOTE — Group Note (Signed)
 Date:  06/02/2024 Time:  11:06 AM  Group Topic/Focus:  Goals Group:   The focus of this group is to help patients establish daily goals to achieve during treatment and discuss how the patient can incorporate goal setting into their daily lives to aide in recovery.    Participation Level:  Did Not Attend  Participation Quality:  Did Not Attend  Affect:  Did Not Attend  Cognitive:  Did Not Attend  Insight: None  Engagement in Group:  Did Not Attend  Modes of Intervention:  Did Not Attend  Additional Comments:    Richard Mclean 06/02/2024, 11:06 AM

## 2024-06-02 NOTE — Group Note (Signed)
 Recreation Therapy Group Note   Group Topic:Animal Assisted Therapy   Group Date: 06/02/2024 Start Time: 0947 End Time: 1030 Facilitators: Jashley Yellin-McCall, LRT,CTRS Location: [address] Dayroom   Animal-Assisted Activity (AAA) Program Checklist/Progress Notes Patient Eligibility Criteria Checklist & Daily Group note for Rec Tx Intervention  AAA/T Program Assumption of Risk Form signed by Patient/ or Parent Legal Guardian Yes  Patient is free of allergies or severe asthma Yes  Patient reports no fear of animals Yes  Patient reports no history of cruelty to animals Yes  Patient understands his/her participation is voluntary Yes  Patient washes hands before animal contact Yes  Patient washes hands after animal contact Yes  Behavioral Response: Engaged   Education: Charity fundraiser, Appropriate Animal Interaction   Education Outcome: Acknowledges education.    Affect/Mood: Appropriate   Participation Level: Engaged   Participation Quality: Independent   Behavior: Appropriate   Speech/Thought Process: Focused   Insight: Good   Judgement: Good   Modes of Intervention: Teaching laboratory technician   Patient Response to Interventions:  Engaged   Education Outcome:  In group clarification offered    Clinical Observations/Individualized Feedback: Patient attended session and interacted appropriately with therapy dog and peers. Patient was called out of group to meet with doctor and didn't return.    Plan: Continue to engage patient in RT group sessions 2-3x/week.   Caley Ciaramitaro-McCall, LRT,CTRS 06/02/2024 12:17 PM

## 2024-06-02 NOTE — Plan of Care (Signed)
   Problem: Education: Goal: Emotional status will improve Outcome: Progressing Goal: Mental status will improve Outcome: Progressing   Problem: Activity: Goal: Interest or engagement in activities will improve Outcome: Progressing

## 2024-06-02 NOTE — Progress Notes (Signed)
(  Sleep Hours) - 7  (Any PRNs that were needed, meds refused, or side effects to meds)- atarax  50mg  and trazodone  100mg , no meds refused, no side effects to meds  (Any disturbances and when (visitation, over night)- n/a  (Concerns raised by the patient)- n/a  (SI/HI/AVH)- denies

## 2024-06-02 NOTE — Group Note (Signed)
 LCSW Group Therapy Note   Group Date: 06/02/2024 Start Time: 1100 End Time: 1200   Participation:  patient was present and actively participated in the conversation.  Type of Therapy:  Group Therapy   Topic:  Stronger Together:  Building Healthy Relationships  Objective:  To explore loneliness, boundaries, and safe ways to build relationships.  Goals: Recognize healthy vs. unhealthy relationships. Learn safe ways to connect with others. Strengthen communication and [employer].  Summary:  Participants discussed loneliness, healthy connections, and setting boundaries. They explored safe ways to meet people and shared personal experiences. Key insights were reinforced through discussion and quotes.  Therapeutic Modalities Used: Cognitive Behavioral Therapy (CBT) Elements - Identifying unhealthy relationship patterns, challenging negative thoughts about connection. Dialectical Behavior Therapy (DBT) Elements - Interpersonal effectiveness, setting and maintaining boundaries. Supportive Group Therapy - Peer discussion, shared experiences, and emotional validation.   Thirza Pellicano O Tyshae Stair, LCSWA 06/02/2024  4:44 PM

## 2024-06-02 NOTE — Group Note (Signed)
 Date:  06/02/2024 Time:  9:00 PM  Group Topic/Focus:  Wrap-Up Group:   The focus of this group is to help patients review their daily goal of treatment and discuss progress on daily workbooks.    Participation Level:  Did Not Attend  Participation Quality:  N/A  Affect:  N/A  Cognitive:  N/A  Insight: None  Engagement in Group:  N/A  Modes of Intervention:  N/A  Additional Comments:  Patient was encouraged but did not attend  Eward Mace 06/02/2024, 9:00 PM

## 2024-06-02 NOTE — Progress Notes (Signed)
 [facility] MD Progress Note  06/02/2024 11:56 AM Richard Mclean.  MRN:  [id number] Principal Problem: Schizoaffective disorder, mixed type (HCC) Diagnosis: Principal Problem:   Schizoaffective disorder, mixed type (HCC)   ID & Admission Information: Richard Mclean. is an 48 y.o. male who  has a past medical history of Alcohol use disorder, Bipolar disorder, unspecified (HCC), Depression, MDD (major depressive disorder), recurrent episode, severe (HCC) (05/30/2024), Schizophrenia (HCC), and Schizotypal personality disorder (HCC).  He presented on 05/30/2024  6:32 PM for Schizoaffective disorder, mixed type (HCC).  He has a history of an unclear psychotic disorder who was recently admitted twice to [facility] in the month of August for apparent psychosis and bizarre behaviors. He was last hospitalized from 8/18 - 8/25 after presenting with suicidal ideation.  This admission, he presented for SI with plan to jump off a bridge, and appeared actively psychotic.  Subjective:   Case was discussed in the multidisciplinary team. MAR was reviewed and patient was compliant with medications.  No acute events occurred overnight.  PRN's in last 24 hours: Atarax  25 mg and trazodone  100 mg administered at 2133 Nicotine  gum  Psychiatric Team made the following recommendations yesterday: Abilify  prescribed at 15 mg  Naltrexone  started at 25 mg Prozac  20 mg discontinued and Lexapro  10 mg started  Richard Mclean was seen in his room during rounds.  He reported that his mood was good today.  He denies any new psychiatric or medical complaints.  He reports that his appetite is good.  He reports that focus and concentration are adequate.  He denies issues with energy.  He reports adequate sleep.  He denies any medication side effects.  He denies suicidal ideations, homicidal ideations, auditory hallucinations, visual hallucinations, or delusions.  We discussed increasing naltrexone  to 50 mg daily.  If the patient continues to  improve, we will plan on discharging tomorrow.   Past Psychiatric and Medical Medical History:  Past Medical History:  Diagnosis Date   Alcohol use disorder    Bipolar disorder, unspecified (HCC)    Depression    MDD (major depressive disorder), recurrent episode, severe (HCC) 05/30/2024   Schizophrenia (HCC)    Schizotypal personality disorder (HCC)     History reviewed. No pertinent surgical history.  Family History(Medical and Psychiatric):  Family History  Problem Relation Age of Onset   Alcohol abuse Mother    Depression Mother    Mental illness Mother    Mental illness Father    Mental illness Sister    HIV/AIDS Brother    Mental illness Maternal Grandmother    See history and physical    Social History:  Social History   Substance and Sexual Activity  Alcohol Use Yes   Comment: Malt liquor or liquor every other weekend     Social History   Substance and Sexual Activity  Drug Use Not Currently   Types: Marijuana    Social History   Socioeconomic History   Marital status: Single    Spouse name: Not on file   Number of children: 0   Years of education: GED   Highest education level: GED or equivalent  Occupational History   Occupation: Unemployed  Tobacco Use   Smoking status: Every Day    Types: Cigarettes   Smokeless tobacco: Never  Vaping Use   Vaping status: Every Day  Substance and Sexual Activity   Alcohol use: Yes    Comment: Malt liquor or liquor every other weekend   Drug use: Not  Currently    Types: Marijuana   Sexual activity: Yes  Other Topics Concern   Not on file  Social History Narrative   ** Merged History Encounter **       Completed social determinants screening and administered PHQ 9 along with Biopsychosocial assessment.    Social Drivers of Health   Financial Resource Strain: High Risk (05/04/2019)   Overall Financial Resource Strain (CARDIA)    Difficulty of Paying Living Expenses: Hard  Food Insecurity: Food  Insecurity Present (05/30/2024)   Hunger Vital Sign    Worried About Running Out of Food in the Last Year: Sometimes true    Ran Out of Food in the Last Year: Sometimes true  Transportation Needs: No Transportation Needs (05/30/2024)   PRAPARE - Administrator, Civil Service (Medical): No    Lack of Transportation (Non-Medical): No  Physical Activity: Sufficiently Active (05/04/2019)   Exercise Vital Sign    Days of Exercise per Week: 7 days    Minutes of Exercise per Session: 30 min  Stress: Stress Concern Present (05/04/2019)   [employer] of Occupational Health - Occupational Stress Questionnaire    Feeling of Stress : Very much  Social Connections: Socially Isolated (05/04/2019)   Social Connection and Isolation Panel    Frequency of Communication with Friends and Family: Once a week    Frequency of Social Gatherings with Friends and Family: Never    Attends Religious Services: Never    Database administrator or Organizations: No    Attends Engineer, structural: Never    Marital Status: Never married        Current Medications: Current Facility-Administered Medications  Medication Dose Route Frequency Provider Last Rate Last Admin   acetaminophen  (TYLENOL ) tablet 650 mg  650 mg Oral Q6H PRN White, Patrice L, NP       alum & mag hydroxide-simeth (MAALOX/MYLANTA) 200-200-20 MG/5ML suspension 30 mL  30 mL Oral Q4H PRN White, Patrice L, NP   30 mL at 06/01/24 2241   ARIPiprazole  (ABILIFY ) tablet 15 mg  15 mg Oral QHS Kennyth Starleen RAMAN, MD       haloperidol  (HALDOL ) tablet 5 mg  5 mg Oral TID PRN White, Patrice L, NP       And   diphenhydrAMINE  (BENADRYL ) capsule 50 mg  50 mg Oral TID PRN White, Patrice L, NP       haloperidol  lactate (HALDOL ) injection 5 mg  5 mg Intramuscular TID PRN White, Patrice L, NP       And   diphenhydrAMINE  (BENADRYL ) injection 50 mg  50 mg Intramuscular TID PRN White, Patrice L, NP       And   LORazepam  (ATIVAN ) injection 2 mg  2 mg  Intramuscular TID PRN White, Patrice L, NP       haloperidol  lactate (HALDOL ) injection 10 mg  10 mg Intramuscular TID PRN White, Patrice L, NP       And   diphenhydrAMINE  (BENADRYL ) injection 50 mg  50 mg Intramuscular TID PRN White, Patrice L, NP       And   LORazepam  (ATIVAN ) injection 2 mg  2 mg Intramuscular TID PRN White, Patrice L, NP       escitalopram  (LEXAPRO ) tablet 10 mg  10 mg Oral QHS Suhan Paci S, MD   10 mg at 06/01/24 2133   hydrOXYzine  (ATARAX ) tablet 50 mg  50 mg Oral TID PRN Olean Sangster S, MD   50 mg at  06/01/24 2133   magnesium  hydroxide (MILK OF MAGNESIA) suspension 30 mL  30 mL Oral Daily PRN White, Patrice L, NP       naltrexone  (DEPADE) tablet 25 mg  25 mg Oral Daily Raegen Tarpley S, MD   25 mg at 06/02/24 0759   nicotine  polacrilex (NICORETTE ) gum 2 mg  2 mg Oral PRN Bobbitt, Shalon E, NP   2 mg at 06/02/24 0801   traZODone  (DESYREL ) tablet 100 mg  100 mg Oral QHS PRN Ginny Loomer S, MD   100 mg at 06/01/24 2133    Lab Results:  Results for orders placed or performed during the hospital encounter of 05/30/24 (from the past 48 hours)  Folate     Status: None   Collection Time: 06/01/24  7:10 PM  Result Value Ref Range   Folate 19.3 >5.9 ng/mL    Comment: Performed at [facility], [address] [place], [place] [address]  Hemoglobin A1c     Status: None   Collection Time: 06/01/24  7:10 PM  Result Value Ref Range   Hgb A1c MFr Bld 5.2 4.8 - 5.6 %    Comment: (NOTE) Diagnosis of Diabetes The following HbA1c ranges recommended by the American Diabetes Association (ADA) may be used as an aid in the diagnosis of diabetes mellitus.  Hemoglobin             Suggested A1C NGSP%              Diagnosis  <5.7                   Non Diabetic  5.7-6.4                Pre-Diabetic  >6.4                   Diabetic  <7.0                   Glycemic control for                       adults with diabetes.     Mean Plasma Glucose 102.54 mg/dL     Comment: Performed at [facility], [address] [place], [place] [address]  Lipid panel     Status: Abnormal   Collection Time: 06/01/24  7:10 PM  Result Value Ref Range   Cholesterol 208 (H) 0 - 200 mg/dL    Comment:        ATP III CLASSIFICATION:  <200     mg/dL   Desirable  799-760  mg/dL   Borderline High  >=759    mg/dL   High           Triglycerides 346 (H) <150 mg/dL   HDL 37 (L) >59 mg/dL   Total CHOL/HDL Ratio 5.6 RATIO   VLDL 69 (H) 0 - 40 mg/dL   LDL Cholesterol 897 (H) 0 - 99 mg/dL    Comment:        Total Cholesterol/HDL:CHD Risk Coronary Heart Disease Risk Table                     Men   Women  1/2 Average Risk   3.4   3.3  Average Risk       5.0   4.4  2 X Average Risk   9.6   7.1  3 X Average Risk  23.4  11.0        Use the calculated Patient Ratio above and the CHD Risk Table to determine the patient's CHD Risk.        ATP III CLASSIFICATION (LDL):  <100     mg/dL   Optimal  899-870  mg/dL   Near or Above                    Optimal  130-159  mg/dL   Borderline  839-810  mg/dL   High  >809     mg/dL   Very High Performed at [facility], [address], [place], [place] [address]   Vitamin B12     Status: None   Collection Time: 06/01/24  7:10 PM  Result Value Ref Range   Vitamin B-12 260 180 - 914 pg/mL    Comment: Performed at [facility], [address] [place], [place] [address]  VITAMIN D  25 Hydroxy (Vit-D Deficiency, Fractures)     Status: None   Collection Time: 06/01/24  7:10 PM  Result Value Ref Range   Vit D, 25-Hydroxy 32.44 30 - 100 ng/mL    Comment: (NOTE) Vitamin D  deficiency has been defined by the Institute of Medicine  and an Endocrine Society practice guideline as a level of serum 25-OH  vitamin D  less than 20 ng/mL (1,2). The Endocrine Society went on to  further define vitamin D  insufficiency as a level between 21 and 29  ng/mL (2).  1. IOM (Institute of Medicine). 2010.  Dietary reference intakes for  calcium and D. [place]: [employer]. 2. Holick MF, Binkley [place], Bischoff-Ferrari HA, et al. Evaluation,  treatment, and prevention of vitamin D  deficiency: an Endocrine  Society clinical practice guideline, JCEM. 2011 Jul; 96(7): 1911-30.  Performed at [facility], [address] [place], [place] [address]     Blood Alcohol level:  Lab Results  Component Value Date   ETH 127 (H) 05/29/2024   ETH <15 05/11/2024    Metabolic Disorder Labs: Lab Results  Component Value Date   HGBA1C 5.2 06/01/2024   MPG 102.54 06/01/2024   MPG 108 05/04/2024   No results found for: PROLACTIN Lab Results  Component Value Date   CHOL 208 (H) 06/01/2024   TRIG 346 (H) 06/01/2024   HDL 37 (L) 06/01/2024   CHOLHDL 5.6 06/01/2024   VLDL 69 (H) 06/01/2024   LDLCALC 102 (H) 06/01/2024   LDLCALC 113 (H) 05/04/2024    Physical Findings: AIMS:  , ,  ,  ,    CIWA:    COWS:     Psychiatric Specialty Exam:  Presentation  General Appearance: Appropriate for Environment  Eye Contact: Good  Speech: Clear and Coherent; Normal Rate  Speech Volume: Normal  Handedness: Right   Mood and Affect  Mood: Euthymic; Anxious  Affect: Congruent   Thought Process  Thought Processes: Linear  Descriptions of Associations: Intact  Orientation: Full (Time, Place and Person)  Thought Content: Logical  History of Schizophrenia/Schizoaffective disorder: No  Duration of Psychotic Symptoms: NA Hallucinations: Hallucinations: None  Ideas of Reference: None  Suicidal Thoughts: Suicidal Thoughts: No  Homicidal Thoughts: Homicidal Thoughts: No   Sensorium  Memory: Immediate Good  Judgment: Fair  Insight: Fair   Executive Functions  Concentration: Good  Attention Span: Good  Recall: Good  Fund of Knowledge: Good  Language: Good   Psychomotor Activity  Psychomotor Activity: Psychomotor Activity: Normal   Assets  Assets: Manufacturing systems engineer; Social Support; Resilience   Sleep  Sleep: Sleep: Good   Musculoskeletal: Strength & Muscle Tone: within normal limits Gait & Station: normal Patient leans: N/A   Physical Exam: General: Sitting comfortably. NAD. HEENT: Normocephalic, atraumatic, MMM, EMOI Lungs: no increased work of breathing noted Heart: no cyanosis Abdomen: Non distended Musculoskeletal: FROM. No obvious deformities Skin: Warm, dry, intact. No rashes noted Neuro: No obvious focal deficits.  Gait and station are normal  Review of Systems  Constitutional: Negative.   HENT: Negative.    Eyes: Negative.   Respiratory: Negative.    Cardiovascular: Negative.   Gastrointestinal: Negative.   Genitourinary: Negative.   Skin: Negative.   Neurological: Negative.   Psychiatric/Behavioral:  Positive for anxiety   Blood pressure 122/78, pulse 77, temperature 98.1 F (36.7 C), temperature source Oral, resp. rate 16, height 5' 9 (1.753 m), weight 93 kg, SpO2 99%. Body mass index is 30.27 kg/m.  ASSESSMENT: Richard Mclean. is an 47 y.o. male who  has a past medical history of Alcohol use disorder, Bipolar disorder, unspecified (HCC), Depression, MDD (major depressive disorder), recurrent episode, severe (HCC) (05/30/2024), Schizophrenia (HCC), and Schizotypal personality disorder (HCC).  He presented on 05/30/2024  6:32 PM for Schizoaffective disorder, mixed type (HCC).  He presented with suicidal ideation and plan to jump off a bridge.  Diagnoses / Active Problems: Patient Active Problem List   Diagnosis Date Noted   Schizoaffective disorder, mixed type (HCC) 05/31/2024      PLAN: Safety and Monitoring:  -- Involuntary admission to inpatient psychiatric unit for safety, stabilization and treatment  -- Daily contact with patient to assess and evaluate symptoms and progress in treatment  -- Patient's case to be discussed in multi-disciplinary team meeting  -- Observation Level  : q15 minute checks  -- Vital signs:  q12 hours  -- Precautions: suicide, elopement, and assault  2. Psychiatric Diagnoses and Treatment:  Patient Active Problem List   Diagnosis Date Noted   Schizoaffective disorder, mixed type (HCC) 05/31/2024     Scheduled Medications:  ARIPiprazole   15 mg Oral QHS   escitalopram   10 mg Oral QHS   naltrexone   25 mg Oral Daily    As Needed Medications: acetaminophen , alum & mag hydroxide-simeth, haloperidol  **AND** diphenhydrAMINE , haloperidol  lactate **AND** diphenhydrAMINE  **AND** LORazepam , haloperidol  lactate **AND** diphenhydrAMINE  **AND** LORazepam , hydrOXYzine , magnesium  hydroxide, nicotine  polacrilex, traZODone     3. Medical Issues Being Addressed:   -- Patient denies chronic medical problems  Labs reviewed, mostly unremarkable   Tobacco Use Disorder  -- Nicotine  replacement as above  -- Smoking cessation encouraged  4. Discharge Planning:   -- Social work and case management to assist with discharge planning and identification of hospital follow-up needs prior to discharge  -- Estimated LOS: 5 to 7 days  -- Discharge Concerns: Need to establish a safety plan; Medication compliance and effectiveness  -- Discharge Goals: Return home with outpatient referrals for mental health follow-up including medication management/psychotherapy  5. Short Term Goals:  Improve ability to identify changes in lifestyle to reduce recurrence of condition, verbalize feelings, disclose and discuss suicidal ideas, demonstrate self-control, identify and develop effective coping behaviors, compliance with prescribed medications, identify triggers associated with substance abuse/mental health issues, participate in unit milieu and in scheduled group therapies   6. Long Term Goals: Improvement in symptoms so the patient is ready for discharge   --The risks/benefits/side-effects/alternatives to the medications above were discussed in detail with the patient and  time was given  for questions. The patient provided informed consent.   -- Metabolic profile and EKG monitoring obtained while on an atypical antipsychotic and listed in the EHR    Total Time Spent in Direct Patient Care:  I personally spent 35 minutes on the unit in direct patient care. The direct patient care time included face-to-face time with the patient, reviewing the patient's chart, communicating with other professionals, and coordinating care. Greater than 50% of this time was spent in counseling or coordinating care with the patient regarding goals of hospitalization, psycho-education, and discharge planning needs.      Glendia Kitty, MD Psychiatrist  06/02/2024, 11:56 AM   I certify that inpatient services furnished can reasonably be expected to improve the patient's condition.    Portions of this note were created using voice recognition software. Minor syntax errors, grammatical content, spelling, or punctuation errors may have occurred unintentionally. Please notify the dino if the meaning of any statement is unclear.

## 2024-06-03 DIAGNOSIS — F250 Schizoaffective disorder, bipolar type: Principal | ICD-10-CM

## 2024-06-03 MED ORDER — ARIPIPRAZOLE 15 MG PO TABS
15.0000 mg | ORAL_TABLET | Freq: Every day | ORAL | 0 refills | Status: AC
  Filled 2024-06-09: qty 30, 30d supply, fill #0

## 2024-06-03 MED ORDER — ESCITALOPRAM OXALATE 10 MG PO TABS
10.0000 mg | ORAL_TABLET | Freq: Every day | ORAL | 0 refills | Status: AC
  Filled 2024-06-09: qty 30, 30d supply, fill #0

## 2024-06-03 MED ORDER — NALTREXONE HCL 50 MG PO TABS
50.0000 mg | ORAL_TABLET | Freq: Every day | ORAL | Status: DC
  Filled 2024-06-03: qty 7

## 2024-06-03 MED ORDER — NALTREXONE HCL 50 MG PO TABS
50.0000 mg | ORAL_TABLET | Freq: Every day | ORAL | 0 refills | Status: AC
  Filled 2024-06-09: qty 30, 30d supply, fill #0

## 2024-06-03 NOTE — Group Note (Signed)
 Recreation Therapy Group Note   Group Topic:Communication  Group Date: 06/03/2024 Start Time: 0930 End Time: 1005 Facilitators: Duha Abair-McCall, LRT,CTRS Location: [address] Dayroom   Group Topic: Communication, Problem Solving   Goal Area(s) Addresses:  Patient will effectively listen to complete activity.  Patient will identify communication skills used to make activity successful.  Patient will identify how skills used during activity can be used to reach post d/c goals.    Behavioral Response:   Intervention: Building surveyor Activity - Geometric pattern cards, pencils, blank paper    Activity: Geometric Drawings.  Three volunteers from the peer group will be shown an abstract picture with a particular arrangement of geometrical shapes.  Each round, one 'speaker' will describe the pattern, as accurately as possible without revealing the image to the group.  The remaining group members will listen and draw the picture to reflect how it is described to them. Patients with the role of 'listener' cannot ask clarifying questions but, may request that the speaker repeat a direction. Once the drawings are complete, the presenter will show the rest of the group the picture and compare how close each person came to drawing the picture. LRT will facilitate a post-activity discussion regarding effective communication and the importance of planning, listening, and asking for clarification in daily interactions with others.  Education: Environmental consultant, Active listening, Support systems, Discharge planning  Education Outcome: Acknowledges understanding/In group clarification offered/Needs additional education.    Affect/Mood: Appropriate   Participation Level: Engaged   Participation Quality: Independent   Behavior: Appropriate   Speech/Thought Process: Focused   Insight: Good   Judgement: Good   Modes of Intervention: Activity   Patient Response to Interventions:   Engaged   Education Outcome:  In group clarification offered    Clinical Observations/Individualized Feedback: Pt was focused and attentive during group session. Pt was able to complete the drawings presented in group.      Plan: Continue to engage patient in RT group sessions 2-3x/week.   Waylin Dorko-McCall, LRT,CTRS 06/03/2024 11:35 AM

## 2024-06-03 NOTE — Progress Notes (Signed)
 Patient discharged off unit at 1310. Patient belongings reviewed and acknowledged by patient. AVS and Transition Record reviewed and acknowledged by patient. Safety plan completed by patient and copy provided. Patient denies SI, plan or intent. Denies HI. Denies AVH. No observed or reported side effects to medication. No observed or reported agitation, aggression, or other acute emotional distress. No observed or reported physical abnormalities or concerns. Patient transportation from facility verified and observed.

## 2024-06-03 NOTE — Transportation (Signed)
 06/03/2024  Richard Mclean. DOB: 1976-09-11 MRN: [id number]   RIDER WAIVER AND RELEASE OF LIABILITY  For the purposes of helping with transportation needs, [facility] partners with outside transportation providers (taxi companies, [place], Catering manager.) to give [facility] patients or other approved people the choice of on-demand rides Public librarian) to our buildings for non-emergency visits.  By using [employer], I, the person signing this document, on behalf of myself and/or any legal minors (in my care using the [employer]), agree:  Science writer given to me are supplied by independent, outside transportation providers who do not work for, or have any affiliation with, [employer]. [place] is not a transportation company. [facility] has no control over the quality or safety of the rides I get using [employer]. [facility] has no control over whether any outside ride will happen on time or not. [facility] gives no guarantee on the reliability, quality, safety, or availability on any rides, or that no mistakes will happen. I know and accept that traveling by vehicle (car, truck, SVU, fleeta, bus, taxi, etc.) has risks of serious injuries such as disability, being paralyzed, and death. I know and agree the risk of using [employer] is mine alone, and not [employer]. [employer] are provided as is and as are available. The transportation providers are in charge for all inspections and care of the vehicles used to provide these rides. I agree not to take legal action against [facility], its agents, employees, officers, directors, representatives, insurers, attorneys, assigns, successors, subsidiaries, and affiliates at any time for any reasons related directly or indirectly to using [employer]. I also agree not to take legal action against [facility] or its affiliates for any injury, death, or damage to property caused by or related to  using [employer]. I have read this Waiver and Release of Liability, and I understand the terms used in it and their legal meaning. This Waiver is freely and voluntarily given with the understanding that my right (or any legal minors) to legal action against [place] relating to [employer] is knowingly given up to use these services.   I attest that I read the Ride Waiver and Release of Liability to [name], gave [name] the opportunity to ask questions and answered the questions asked (if any). I affirm that [name] then provided consent for assistance with transportation.

## 2024-06-03 NOTE — BHH Suicide Risk Assessment (Signed)
 [facility] Discharge Suicide Risk Assessment   Principal Problem: Schizoaffective disorder, mixed type [facility])  Discharge Diagnoses: Principal Problem:   Schizoaffective disorder, mixed type (HCC)      Total Time spent with patient: 40  Musculoskeletal: Strength & Muscle Tone: within normal limits Gait & Station: normal Patient leans: N/A   Psychiatric Specialty Exam:  Presentation  General Appearance: Appropriate for Environment  Eye Contact: Good  Speech: Clear and Coherent; Normal Rate  Speech Volume: Normal  Handedness: Right   Mood and Affect  Mood: Euthymic  Affect: Congruent   Thought Process  Thought Processes: Linear  Descriptions of Associations: Intact  Orientation: Full (Time, Place and Person)  Thought Content: Logical  History of Schizophrenia/Schizoaffective disorder: No  Duration of Psychotic Symptoms: NA Hallucinations: Hallucinations: None  Ideas of Reference: None  Suicidal Thoughts: Suicidal Thoughts: No  Homicidal Thoughts: Homicidal Thoughts: No   Sensorium  Memory: Immediate Good  Judgment: Fair  Insight: Fair   Art therapist  Concentration: Good  Attention Span: Good  Recall: Good  Fund of Knowledge: Good  Language: Good   Psychomotor Activity  Psychomotor Activity: Psychomotor Activity: Normal   Assets  Assets: Communication Skills; Social Support; Housing; Leisure Time   Sleep  Sleep: Sleep: Good   Physical Exam: General: Sitting comfortably. NAD. HEENT: Normocephalic, atraumatic, MMM, EMOI Lungs: no increased work of breathing noted Heart: no cyanosis Abdomen: Non distended Musculoskeletal: FROM. No obvious deformities Skin: Warm, dry, intact. No rashes noted Neuro: No obvious focal deficits.  Gait and station are normal  Review of Systems  Constitutional: Negative.   HENT: Negative.    Eyes: Negative.   Respiratory: Negative.    Cardiovascular: Negative.   Gastrointestinal: Negative.    Genitourinary: Negative.   Skin: Negative.   Neurological: Negative.   Psychiatric/Behavioral:  Negative   Mental Status Per Nursing Assessment: NA  Demographic Factors:  Male and Caucasian  Loss Factors: NA  Historical Factors: Family history of mental illness or substance abuse  Risk Reduction Factors:   Sense of responsibility to family, Employed, Positive therapeutic relationship, and Positive coping skills or problem solving skills  Continued Clinical Symptoms:  Previous psychiatric diagnoses and treatments  Cognitive Features That Contribute To Risk:  None  Suicide Risk:  Minimal: No identifiable suicidal ideation.  Patients presenting with no risk factors but with morbid ruminations; may be classified as minimal risk based on the severity of the depressive symptoms.    Follow-up Information     [place] Go on 06/05/2024.   Specialty: Professional Counselor Why: Please go to this provider on 06/05/24 at 9:00 am for an assessment, to obtain therapy services.  You may also go Monday through Friday, from 9 am to 1 pm. Contact information: [address]  [address] [place] [address] [phone]         [facility]. Go on 06/09/2024.   Specialty: Behavioral Health Why: Please go to this provider on 06/09/24 at 7:00 am for an assessment, to obtain medication management services. You may also go Monday through Friday, arrive by 7:00 am. Contact information: [address] [place]  [address] [phone]                 Plan Of Care/Follow-up recommendations:  Activity: as tolerated  Diet: heart healthy  Other: -Follow-up with your outpatient psychiatric provider -instructions on appointment date, time, and address (location) are provided to you in discharge paperwork.  -Take your psychiatric medications as prescribed  at discharge - instructions are provided to you in the discharge  paperwork  -Follow-up with outpatient primary care doctor and other specialists -for management of preventative medicine and chronic medical issues  -Testing: Follow-up with outpatient provider for any abnormal lab results (if any)  -If you are prescribed an atypical antipsychotic medication, we recommend that your outpatient psychiatrist follow routine screening for side effects within 3 months of discharge, including monitoring: AIMS scale, height, weight, blood pressure, fasting lipid panel, HbA1c, and fasting blood sugar.   -Recommend total abstinence from alcohol, tobacco, and other illicit drug use at discharge.   -If your psychiatric symptoms recur, worsen, or if you have side effects to your psychiatric medications, call your outpatient psychiatric provider, 911, [phone] or go to the nearest emergency department.  -If suicidal thoughts occur, immediately call your outpatient psychiatric provider, 911, [phone] or go to the nearest emergency department.   Glendia Kitty, MD 06/03/24 11:14 AM

## 2024-06-03 NOTE — Progress Notes (Signed)
  [facility] Adult Case Management Discharge Plan :  Will you be returning to the same living situation after discharge:  Yes,  patient will be discharging to the [employer] of [address] (shelter) located at [employer]. [place], [place]  At discharge, do you have transportation home?: No. Patient will be transported with a taxi voucher through [place] [employer] at [employer].  Do you have the ability to pay for your medications: Yes,  patient has active health insurance.  Release of information consent forms completed and in the chart;  Patient's signature needed at discharge.  Patient to Follow up at:  Follow-up Information     [place] Go on 06/05/2024.   Specialty: Professional Counselor Why: Please go to this provider on 06/05/24 at 9:00 am for an assessment, to obtain therapy services.  You may also go Monday through Friday, from 9 am to 1 pm. Contact information: [address]  [address] [place] [address] [phone]         [facility]. Go on 06/09/2024.   Specialty: Behavioral Health Why: Please go to this provider on 06/09/24 at 7:00 am for an assessment, to obtain medication management services. You may also go Monday through Friday, arrive by 7:00 am. Contact information: [address] [place]  [address] [phone]                Next level of care provider has access to [facility] Link:no  Safety Planning and Suicide Prevention discussed: No. Patient declined consents. Suicide Prevention Education was reviewed thoroughly with patient, including risk factors, warning signs, and what to do. Mobile Crisis services were described and that telephone number pointed out, with encouragement to patient to put this number in personal cell phone. Brochure was provided to patient to share with natural supports. Patient acknowledged the ways in which they are at risk, and how working through each of their issues  can gradually start to reduce their risk factors. Patient was encouraged to think of the information in the context of people in their own lives. Patient denied having access to firearms Patient verbalized understanding of information provided. Patient endorsed a desire to live.       Has patient been referred to the Quitline?: Patient refused referral for treatment  Patient has been referred for addiction treatment: Patient refused referral for treatment.  Louetta Lame, LCSWA 06/03/2024, 9:03 AM

## 2024-06-03 NOTE — Plan of Care (Signed)
   Problem: Education: Goal: Emotional status will improve Outcome: Progressing Goal: Mental status will improve Outcome: Progressing   Problem: Activity: Goal: Sleeping patterns will improve Outcome: Progressing
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
## 2024-06-03 NOTE — Group Note (Signed)
 Date:  06/03/2024 Time:  9:12 AM  Group Topic/Focus:  Goals Group:   The focus of this group is to help patients establish daily goals to achieve during treatment and discuss how the patient can incorporate goal setting into their daily lives to aide in recovery. Orientation:   The focus of this group is to educate the patient on the purpose and policies of crisis stabilization and provide a format to answer questions about their admission.  The group details unit policies and expectations of patients while admitted.    Participation Level:  Did Not Attend  Additional Comments:  Pt was aware group was beginning, but, chose not to attend.  Adriana GORMAN Blush 06/03/2024, 9:12 AM

## 2024-06-03 NOTE — Progress Notes (Signed)
(  Sleep Hours) - 8.5 (Any PRNs that were needed, meds refused, or side effects to meds)- atarax  50 mg, nicorette  2mg , trazodone  100mg , no meds refused, or side effects to meds  (Any disturbances and when (visitation, over night)- n/a  (Concerns raised by the patient)- n/a  (SI/HI/AVH)- denies

## 2024-06-04 ENCOUNTER — Encounter (HOSPITAL_BASED_OUTPATIENT_CLINIC_OR_DEPARTMENT_OTHER): Payer: MEDICAID | Admitting: General Surgery

## 2024-06-05 NOTE — Progress Notes (Signed)

## 2024-06-09 ENCOUNTER — Other Ambulatory Visit: Payer: Self-pay

## 2024-06-09 NOTE — Congregational Nurse Program (Signed)
  Dept: [phone]   Congregational Nurse Program Note  Date of Encounter: 06/09/2024 [place] [facility] with complaints of right sided rib pain from dumpster diving. No bruising noted, no difficulty with breathing. Client feels he bruised his ribs. Menthol pain patch given to client. Client also needed medication assistance as he had recently been discharged from [facility] with a 7 day supply of medications. He reported that the prescriptions were sent to [facility] on [address] in [place]. He was in agreement with RN contacting the [facility] pharmacy to have the prescriptions transferred and filled at the [facility] in [place]. Client does still need to be established with a primary care provider. RN  to assist as client allows. MARLA Marina BSN, RN Past Medical History: Past Medical History:  Diagnosis Date   Alcohol use disorder    Bipolar disorder, unspecified (HCC)    Depression    MDD (major depressive disorder), recurrent episode, severe (HCC) 05/30/2024   Schizophrenia (HCC)    Schizotypal personality disorder (HCC)     Encounter Details:  Community Questionnaire - 06/09/24 0900       Questionnaire   Ask client: Do you give verbal consent for me to treat you today? Yes    Student Assistance N/A    Location Patient Served  [facility]    Encounter Setting CN site    Population Status Unhoused    Insurance Medicaid   [employer] has been approved. [employer] care, still needs his card   Insurance/Financial Assistance Referral N/A   Medicaid has been approved   Medication Have Medication Insecurities   not currenlty taking any medications   Medical Provider No   apt made at [facility]   Screening Referrals Made N/A    Medical Referrals Made N/A    Medical Appointment Completed N/A    CNP Interventions Advocate/Support;Navigate Healthcare System;Case Management   PCP apt made   Screenings CN Performed N/A     ED Visit Averted N/A    Life-Saving Intervention Made N/A

## 2024-06-16 NOTE — Congregational Nurse Program (Signed)
  Dept: [phone]   Congregational Nurse Program Note  Date of Encounter: 06/16/2024 Client to [facility], nurse led clinic, with request for assistance in finding a dentist. RN contacted [employer], they do not take clients [facility]. RN advised client that there were 2 dentists that do take Medicaid, however the appointments are scheduling into February. One of the offices is only open M,W,F. RN offered to contact that office when client returns to clinic tomorrow. Client in agreement. MARLA Marina BSN, RN Past Medical History: Past Medical History:  Diagnosis Date   Alcohol use disorder    Bipolar disorder, unspecified (HCC)    Depression    MDD (major depressive disorder), recurrent episode, severe (HCC) 05/30/2024   Schizophrenia (HCC)    Schizotypal personality disorder [facility])     Encounter Details:  Community Questionnaire - 06/16/24 1354       Questionnaire   Ask client: Do you give verbal consent for me to treat you today? Yes    Student Assistance N/A    Location Patient Served  [facility]    Encounter Setting CN site    Population Status Unhoused    Insurance Medicaid   [employer] has been approved. [employer] care, still needs his card   Insurance/Financial Assistance Referral N/A   Medicaid has been approved   Medication Have Medication Insecurities   not currenlty taking any medications   Medical Provider No   apt made at [facility]   Screening Referrals Made N/A    Medical Referrals Made Dental    Medical Appointment Completed N/A    CNP Interventions Advocate/Support;Navigate Healthcare System;Case Management   PCP apt made   Screenings CN Performed N/A    ED Visit Averted N/A    Life-Saving Intervention Made N/A

## 2024-07-03 NOTE — Progress Notes (Addendum)
 Pt attended 05/20/2024 screening event with BP of 132/81.  Per initial f/u pt was reached out via phone and pt's mother answered (its her number on file). She stated that pt is and stated that pt does not live there. No updated phone number or address was given at event.  Per chart review pt does not have a PCP, insurance, and is a smoker. Pt does indicate food & housing SDOH needs at this time.  Additional pt f/u to be scheduled per health equity protocol to see if patient attends any future area events or engages in any [facility]-visible healthcare encounters.

## 2024-09-09 NOTE — Progress Notes (Signed)
 Pt attended 05/20/2024 screening event with BP of 132/81.   Per 60 f/u pt could not be reached and per previous encounter the number on file is pt's mother's. On the updated event form pt noted he does not have a phone number. Pt did provide updated address and was mailed letter with Get Care Now flyer, Multiple [facility] flyer, food & housing resources, BP management flyer, BP log, reduced sodium alternatives flyer, smoking cessation flyer, [employer] card & [employer] flyer.  Per chart review pt does not have a PCP, insurance, and is a smoker. Pt does indicate food & housing SDOH needs at this time.  Additional pt f/u to be scheduled at this time per health equity protocol.
